# Patient Record
Sex: Male | Born: 1965 | Race: Black or African American | Hispanic: No | Marital: Married | Smoking: Never smoker
Health system: Southern US, Community
[De-identification: ages and names within clinical notes are randomized; demographics above are authoritative.]

## PROBLEM LIST (undated history)

## (undated) DIAGNOSIS — K469 Unspecified abdominal hernia without obstruction or gangrene: Secondary | ICD-10-CM

## (undated) DIAGNOSIS — K219 Gastro-esophageal reflux disease without esophagitis: Secondary | ICD-10-CM

## (undated) DIAGNOSIS — C801 Malignant (primary) neoplasm, unspecified: Secondary | ICD-10-CM

## (undated) DIAGNOSIS — I10 Essential (primary) hypertension: Secondary | ICD-10-CM

## (undated) DIAGNOSIS — Z9289 Personal history of other medical treatment: Secondary | ICD-10-CM

## (undated) DIAGNOSIS — M1990 Unspecified osteoarthritis, unspecified site: Secondary | ICD-10-CM

## (undated) DIAGNOSIS — G4730 Sleep apnea, unspecified: Secondary | ICD-10-CM

## (undated) DIAGNOSIS — E669 Obesity, unspecified: Secondary | ICD-10-CM

## (undated) DIAGNOSIS — M545 Low back pain, unspecified: Secondary | ICD-10-CM

## (undated) DIAGNOSIS — K439 Ventral hernia without obstruction or gangrene: Secondary | ICD-10-CM

## (undated) DIAGNOSIS — G5601 Carpal tunnel syndrome, right upper limb: Secondary | ICD-10-CM

## (undated) DIAGNOSIS — K760 Fatty (change of) liver, not elsewhere classified: Secondary | ICD-10-CM

## (undated) HISTORY — DX: Low back pain, unspecified: M54.50

## (undated) HISTORY — DX: Sleep apnea, unspecified: G47.30

## (undated) HISTORY — DX: Malignant (primary) neoplasm, unspecified: C80.1

## (undated) HISTORY — DX: Unspecified osteoarthritis, unspecified site: M19.90

## (undated) HISTORY — DX: Obesity, unspecified: E66.9

## (undated) HISTORY — DX: Low back pain: M54.5

---

## 2003-05-08 ENCOUNTER — Encounter: Admission: RE | Admit: 2003-05-08 | Discharge: 2003-05-08 | Payer: Self-pay | Admitting: *Deleted

## 2003-05-08 ENCOUNTER — Encounter: Payer: Self-pay | Admitting: *Deleted

## 2006-01-30 ENCOUNTER — Ambulatory Visit (HOSPITAL_COMMUNITY): Admission: RE | Admit: 2006-01-30 | Discharge: 2006-01-30 | Payer: Self-pay | Admitting: Family Medicine

## 2006-05-24 ENCOUNTER — Encounter (INDEPENDENT_AMBULATORY_CARE_PROVIDER_SITE_OTHER): Payer: Self-pay | Admitting: *Deleted

## 2006-05-24 ENCOUNTER — Ambulatory Visit (HOSPITAL_COMMUNITY): Admission: RE | Admit: 2006-05-24 | Discharge: 2006-05-24 | Payer: Self-pay | Admitting: Gastroenterology

## 2006-06-08 ENCOUNTER — Encounter: Admission: RE | Admit: 2006-06-08 | Discharge: 2006-06-08 | Payer: Self-pay | Admitting: General Surgery

## 2006-06-25 HISTORY — PX: COLON SURGERY: SHX602

## 2006-06-26 ENCOUNTER — Encounter (INDEPENDENT_AMBULATORY_CARE_PROVIDER_SITE_OTHER): Payer: Self-pay | Admitting: Specialist

## 2006-06-26 ENCOUNTER — Inpatient Hospital Stay (HOSPITAL_COMMUNITY): Admission: RE | Admit: 2006-06-26 | Discharge: 2006-07-04 | Payer: Self-pay | Admitting: General Surgery

## 2006-07-02 ENCOUNTER — Ambulatory Visit: Payer: Self-pay | Admitting: Oncology

## 2006-07-05 ENCOUNTER — Ambulatory Visit: Payer: Self-pay | Admitting: Oncology

## 2006-10-10 ENCOUNTER — Ambulatory Visit: Payer: Self-pay | Admitting: Oncology

## 2006-10-12 LAB — COMPREHENSIVE METABOLIC PANEL
Albumin: 4.3 g/dL (ref 3.5–5.2)
CO2: 25 mEq/L (ref 19–32)
Calcium: 9.6 mg/dL (ref 8.4–10.5)
Chloride: 102 mEq/L (ref 96–112)
Glucose, Bld: 136 mg/dL — ABNORMAL HIGH (ref 70–99)
Potassium: 4.2 mEq/L (ref 3.5–5.3)
Sodium: 138 mEq/L (ref 135–145)
Total Bilirubin: 0.9 mg/dL (ref 0.3–1.2)
Total Protein: 7.4 g/dL (ref 6.0–8.3)

## 2006-10-12 LAB — CBC WITH DIFFERENTIAL/PLATELET
Eosinophils Absolute: 0 10*3/uL (ref 0.0–0.5)
LYMPH%: 36.6 % (ref 14.0–48.0)
MONO#: 0.4 10*3/uL (ref 0.1–0.9)
NEUT#: 1.8 10*3/uL (ref 1.5–6.5)
Platelets: 305 10*3/uL (ref 145–400)
RBC: 5.27 10*6/uL (ref 4.20–5.71)
WBC: 3.6 10*3/uL — ABNORMAL LOW (ref 4.0–10.0)
lymph#: 1.3 10*3/uL (ref 0.9–3.3)

## 2006-10-12 LAB — LACTATE DEHYDROGENASE: LDH: 215 U/L (ref 94–250)

## 2006-10-22 LAB — CBC WITH DIFFERENTIAL/PLATELET
BASO%: 0.6 % (ref 0.0–2.0)
EOS%: 2 % (ref 0.0–7.0)
MCH: 23.8 pg — ABNORMAL LOW (ref 28.0–33.4)
MCHC: 32.6 g/dL (ref 32.0–35.9)
MONO#: 0.5 10*3/uL (ref 0.1–0.9)
RBC: 5.05 10*6/uL (ref 4.20–5.71)
RDW: 27.4 % — ABNORMAL HIGH (ref 11.2–14.6)
WBC: 5.5 10*3/uL (ref 4.0–10.0)
lymph#: 2.2 10*3/uL (ref 0.9–3.3)

## 2006-11-05 LAB — CBC WITH DIFFERENTIAL/PLATELET
BASO%: 0.5 % (ref 0.0–2.0)
Basophils Absolute: 0 10*3/uL (ref 0.0–0.1)
EOS%: 1.6 % (ref 0.0–7.0)
HCT: 35.4 % — ABNORMAL LOW (ref 38.7–49.9)
HGB: 11.8 g/dL — ABNORMAL LOW (ref 13.0–17.1)
MONO#: 0.8 10*3/uL (ref 0.1–0.9)
NEUT%: 41.3 % (ref 40.0–75.0)
RDW: 33.6 % — ABNORMAL HIGH (ref 11.2–14.6)
WBC: 4.7 10*3/uL (ref 4.0–10.0)
lymph#: 1.9 10*3/uL (ref 0.9–3.3)

## 2006-11-19 LAB — CBC WITH DIFFERENTIAL/PLATELET
Basophils Absolute: 0 10*3/uL (ref 0.0–0.1)
EOS%: 1.2 % (ref 0.0–7.0)
Eosinophils Absolute: 0.1 10*3/uL (ref 0.0–0.5)
HCT: 37.7 % — ABNORMAL LOW (ref 38.7–49.9)
HGB: 12.4 g/dL — ABNORMAL LOW (ref 13.0–17.1)
MCH: 26.2 pg — ABNORMAL LOW (ref 28.0–33.4)
NEUT#: 3.3 10*3/uL (ref 1.5–6.5)
NEUT%: 52.3 % (ref 40.0–75.0)
RDW: 37 % — ABNORMAL HIGH (ref 11.2–14.6)
lymph#: 2.1 10*3/uL (ref 0.9–3.3)

## 2006-11-22 ENCOUNTER — Ambulatory Visit: Payer: Self-pay | Admitting: Oncology

## 2006-11-26 LAB — CBC WITH DIFFERENTIAL/PLATELET
Basophils Absolute: 0 10*3/uL (ref 0.0–0.1)
EOS%: 1 % (ref 0.0–7.0)
Eosinophils Absolute: 0.1 10*3/uL (ref 0.0–0.5)
HCT: 38.5 % — ABNORMAL LOW (ref 38.7–49.9)
HGB: 12.6 g/dL — ABNORMAL LOW (ref 13.0–17.1)
LYMPH%: 37.3 % (ref 14.0–48.0)
MCH: 27.2 pg — ABNORMAL LOW (ref 28.0–33.4)
MCV: 82.8 fL (ref 81.6–98.0)
MONO%: 13.2 % — ABNORMAL HIGH (ref 0.0–13.0)
NEUT#: 3 10*3/uL (ref 1.5–6.5)
NEUT%: 48 % (ref 40.0–75.0)
Platelets: 280 10*3/uL (ref 145–400)
RDW: 35.3 % — ABNORMAL HIGH (ref 11.2–14.6)

## 2006-11-26 LAB — COMPREHENSIVE METABOLIC PANEL
ALT: 31 U/L (ref 0–53)
Albumin: 4.3 g/dL (ref 3.5–5.2)
CO2: 26 mEq/L (ref 19–32)
Calcium: 9.6 mg/dL (ref 8.4–10.5)
Chloride: 103 mEq/L (ref 96–112)
Glucose, Bld: 82 mg/dL (ref 70–99)
Sodium: 139 mEq/L (ref 135–145)
Total Protein: 7.4 g/dL (ref 6.0–8.3)

## 2006-11-26 LAB — LACTATE DEHYDROGENASE: LDH: 270 U/L — ABNORMAL HIGH (ref 94–250)

## 2006-12-24 LAB — CBC WITH DIFFERENTIAL/PLATELET
BASO%: 0.3 % (ref 0.0–2.0)
EOS%: 1 % (ref 0.0–7.0)
MCH: 30.3 pg (ref 28.0–33.4)
MCHC: 33.5 g/dL (ref 32.0–35.9)
MONO#: 0.6 10*3/uL (ref 0.1–0.9)
NEUT%: 49.5 % (ref 40.0–75.0)
RBC: 4.36 10*6/uL (ref 4.20–5.71)
RDW: 27.4 % — ABNORMAL HIGH (ref 11.2–14.6)
WBC: 5.2 10*3/uL (ref 4.0–10.0)
lymph#: 2 10*3/uL (ref 0.9–3.3)

## 2007-01-03 ENCOUNTER — Ambulatory Visit: Payer: Self-pay | Admitting: Oncology

## 2007-01-07 LAB — CBC WITH DIFFERENTIAL/PLATELET
BASO%: 0.6 % (ref 0.0–2.0)
Basophils Absolute: 0 10*3/uL (ref 0.0–0.1)
EOS%: 1.5 % (ref 0.0–7.0)
HGB: 13.6 g/dL (ref 13.0–17.1)
MCH: 31.5 pg (ref 28.0–33.4)
MONO#: 0.6 10*3/uL (ref 0.1–0.9)
NEUT#: 2.8 10*3/uL (ref 1.5–6.5)
RDW: 26.4 % — ABNORMAL HIGH (ref 11.2–14.6)
WBC: 5 10*3/uL (ref 4.0–10.0)
lymph#: 1.5 10*3/uL (ref 0.9–3.3)

## 2007-01-21 LAB — CBC WITH DIFFERENTIAL/PLATELET
Basophils Absolute: 0 10*3/uL (ref 0.0–0.1)
Eosinophils Absolute: 0.1 10*3/uL (ref 0.0–0.5)
HCT: 39.8 % (ref 38.7–49.9)
HGB: 13.5 g/dL (ref 13.0–17.1)
MCV: 91.1 fL (ref 81.6–98.0)
NEUT#: 2.3 10*3/uL (ref 1.5–6.5)
RDW: 22.7 % — ABNORMAL HIGH (ref 11.2–14.6)
lymph#: 2.1 10*3/uL (ref 0.9–3.3)

## 2007-01-21 LAB — COMPREHENSIVE METABOLIC PANEL
BUN: 10 mg/dL (ref 6–23)
CO2: 26 mEq/L (ref 19–32)
Creatinine, Ser: 1.03 mg/dL (ref 0.40–1.50)
Glucose, Bld: 88 mg/dL (ref 70–99)
Total Bilirubin: 0.6 mg/dL (ref 0.3–1.2)
Total Protein: 7 g/dL (ref 6.0–8.3)

## 2007-01-21 LAB — LACTATE DEHYDROGENASE: LDH: 226 U/L (ref 94–250)

## 2007-02-05 LAB — CBC WITH DIFFERENTIAL/PLATELET
BASO%: 0.4 % (ref 0.0–2.0)
Eosinophils Absolute: 0.1 10*3/uL (ref 0.0–0.5)
HCT: 40.1 % (ref 38.7–49.9)
HGB: 13.6 g/dL (ref 13.0–17.1)
MCHC: 33.8 g/dL (ref 32.0–35.9)
MONO#: 1 10*3/uL — ABNORMAL HIGH (ref 0.1–0.9)
NEUT#: 2.7 10*3/uL (ref 1.5–6.5)
NEUT%: 46.5 % (ref 40.0–75.0)
WBC: 5.9 10*3/uL (ref 4.0–10.0)
lymph#: 2 10*3/uL (ref 0.9–3.3)

## 2007-02-14 ENCOUNTER — Ambulatory Visit: Payer: Self-pay | Admitting: Oncology

## 2007-02-19 LAB — CBC WITH DIFFERENTIAL/PLATELET
Basophils Absolute: 0 10*3/uL (ref 0.0–0.1)
EOS%: 1.4 % (ref 0.0–7.0)
HCT: 40.4 % (ref 38.7–49.9)
HGB: 13.7 g/dL (ref 13.0–17.1)
MCH: 30.7 pg (ref 28.0–33.4)
MONO#: 0.7 10*3/uL (ref 0.1–0.9)
NEUT%: 53.6 % (ref 40.0–75.0)
lymph#: 1.7 10*3/uL (ref 0.9–3.3)

## 2007-03-05 LAB — CBC WITH DIFFERENTIAL/PLATELET
Basophils Absolute: 0 10*3/uL (ref 0.0–0.1)
EOS%: 1.6 % (ref 0.0–7.0)
HCT: 41.2 % (ref 38.7–49.9)
HGB: 13.9 g/dL (ref 13.0–17.1)
LYMPH%: 28.4 % (ref 14.0–48.0)
MCH: 30.8 pg (ref 28.0–33.4)
MCV: 91.1 fL (ref 81.6–98.0)
MONO%: 11 % (ref 0.0–13.0)
NEUT%: 58.4 % (ref 40.0–75.0)

## 2007-03-28 ENCOUNTER — Ambulatory Visit (HOSPITAL_COMMUNITY): Admission: RE | Admit: 2007-03-28 | Discharge: 2007-03-28 | Payer: Self-pay | Admitting: Oncology

## 2007-03-28 LAB — COMPREHENSIVE METABOLIC PANEL
ALT: 62 U/L — ABNORMAL HIGH (ref 0–53)
AST: 53 U/L — ABNORMAL HIGH (ref 0–37)
Albumin: 4.2 g/dL (ref 3.5–5.2)
Alkaline Phosphatase: 58 U/L (ref 39–117)
BUN: 10 mg/dL (ref 6–23)
CO2: 24 mEq/L (ref 19–32)
Calcium: 9.5 mg/dL (ref 8.4–10.5)
Chloride: 105 mEq/L (ref 96–112)
Creatinine, Ser: 0.97 mg/dL (ref 0.40–1.50)
Glucose, Bld: 139 mg/dL — ABNORMAL HIGH (ref 70–99)
Potassium: 3.9 mEq/L (ref 3.5–5.3)
Sodium: 137 mEq/L (ref 135–145)
Total Bilirubin: 0.8 mg/dL (ref 0.3–1.2)
Total Protein: 7.2 g/dL (ref 6.0–8.3)

## 2007-03-28 LAB — CBC WITH DIFFERENTIAL/PLATELET
EOS%: 1.1 % (ref 0.0–7.0)
MCH: 30 pg (ref 28.0–33.4)
MCV: 88.8 fL (ref 81.6–98.0)
MONO%: 8.6 % (ref 0.0–13.0)
RBC: 4.76 10*6/uL (ref 4.20–5.71)
RDW: 20.8 % — ABNORMAL HIGH (ref 11.2–14.6)

## 2007-03-28 LAB — CEA: CEA: 1.4 ng/mL (ref 0.0–5.0)

## 2007-04-01 ENCOUNTER — Ambulatory Visit: Payer: Self-pay | Admitting: Oncology

## 2007-04-14 ENCOUNTER — Emergency Department (HOSPITAL_COMMUNITY): Admission: EM | Admit: 2007-04-14 | Discharge: 2007-04-14 | Payer: Self-pay | Admitting: Emergency Medicine

## 2007-05-06 LAB — CBC WITH DIFFERENTIAL/PLATELET
BASO%: 0.4 % (ref 0.0–2.0)
Basophils Absolute: 0 10*3/uL (ref 0.0–0.1)
EOS%: 1.1 % (ref 0.0–7.0)
Eosinophils Absolute: 0.1 10*3/uL (ref 0.0–0.5)
HCT: 44 % (ref 38.7–49.9)
HGB: 15.1 g/dL (ref 13.0–17.1)
LYMPH%: 22.7 % (ref 14.0–48.0)
MCH: 29.6 pg (ref 28.0–33.4)
MCHC: 34.2 g/dL (ref 32.0–35.9)
MCV: 86.4 fL (ref 81.6–98.0)
MONO#: 0.6 10*3/uL (ref 0.1–0.9)
MONO%: 9 % (ref 0.0–13.0)
NEUT#: 4.4 10*3/uL (ref 1.5–6.5)
NEUT%: 66.8 % (ref 40.0–75.0)
Platelets: 237 10*3/uL (ref 145–400)
RBC: 5.09 10*6/uL (ref 4.20–5.71)
RDW: 16.8 % — ABNORMAL HIGH (ref 11.2–14.6)
WBC: 6.6 10*3/uL (ref 4.0–10.0)
lymph#: 1.5 10*3/uL (ref 0.9–3.3)

## 2007-05-06 LAB — COMPREHENSIVE METABOLIC PANEL
ALT: 84 U/L — ABNORMAL HIGH (ref 0–53)
AST: 72 U/L — ABNORMAL HIGH (ref 0–37)
Alkaline Phosphatase: 60 U/L (ref 39–117)
Creatinine, Ser: 0.98 mg/dL (ref 0.40–1.50)
Sodium: 142 mEq/L (ref 135–145)
Total Bilirubin: 0.5 mg/dL (ref 0.3–1.2)
Total Protein: 7.4 g/dL (ref 6.0–8.3)

## 2007-06-05 ENCOUNTER — Ambulatory Visit: Payer: Self-pay | Admitting: Oncology

## 2007-06-10 ENCOUNTER — Ambulatory Visit (HOSPITAL_COMMUNITY): Admission: RE | Admit: 2007-06-10 | Discharge: 2007-06-10 | Payer: Self-pay | Admitting: Gastroenterology

## 2007-07-26 ENCOUNTER — Ambulatory Visit: Payer: Self-pay | Admitting: Oncology

## 2007-07-30 ENCOUNTER — Ambulatory Visit (HOSPITAL_COMMUNITY): Admission: RE | Admit: 2007-07-30 | Discharge: 2007-07-30 | Payer: Self-pay | Admitting: Oncology

## 2007-07-30 LAB — CBC WITH DIFFERENTIAL/PLATELET
Basophils Absolute: 0 10*3/uL (ref 0.0–0.1)
Eosinophils Absolute: 0 10*3/uL (ref 0.0–0.5)
HGB: 14.9 g/dL (ref 13.0–17.1)
LYMPH%: 26.9 % (ref 14.0–48.0)
MCH: 28 pg (ref 28.0–33.4)
MCV: 82.3 fL (ref 81.6–98.0)
MONO%: 6.5 % (ref 0.0–13.0)
NEUT#: 4.2 10*3/uL (ref 1.5–6.5)
Platelets: 267 10*3/uL (ref 145–400)
RBC: 5.34 10*6/uL (ref 4.20–5.71)

## 2007-07-30 LAB — COMPREHENSIVE METABOLIC PANEL
Albumin: 3.8 g/dL (ref 3.5–5.2)
CO2: 26 mEq/L (ref 19–32)
Calcium: 9 mg/dL (ref 8.4–10.5)
Glucose, Bld: 91 mg/dL (ref 70–99)
Potassium: 3.6 mEq/L (ref 3.5–5.3)
Sodium: 137 mEq/L (ref 135–145)
Total Protein: 7.2 g/dL (ref 6.0–8.3)

## 2007-07-30 LAB — LACTATE DEHYDROGENASE: LDH: 291 U/L — ABNORMAL HIGH (ref 94–250)

## 2007-10-01 ENCOUNTER — Encounter: Admission: RE | Admit: 2007-10-01 | Discharge: 2007-10-01 | Payer: Self-pay | Admitting: Family Medicine

## 2007-10-04 ENCOUNTER — Encounter: Admission: RE | Admit: 2007-10-04 | Discharge: 2007-10-04 | Payer: Self-pay | Admitting: Family Medicine

## 2008-01-22 ENCOUNTER — Ambulatory Visit: Payer: Self-pay | Admitting: Oncology

## 2008-01-27 ENCOUNTER — Ambulatory Visit (HOSPITAL_COMMUNITY): Admission: RE | Admit: 2008-01-27 | Discharge: 2008-01-27 | Payer: Self-pay | Admitting: Oncology

## 2008-01-27 LAB — COMPREHENSIVE METABOLIC PANEL
AST: 71 U/L — ABNORMAL HIGH (ref 0–37)
Alkaline Phosphatase: 52 U/L (ref 39–117)
BUN: 7 mg/dL (ref 6–23)
Calcium: 9.5 mg/dL (ref 8.4–10.5)
Chloride: 105 mEq/L (ref 96–112)
Creatinine, Ser: 0.94 mg/dL (ref 0.40–1.50)

## 2008-01-27 LAB — CBC WITH DIFFERENTIAL/PLATELET
Basophils Absolute: 0.1 10*3/uL (ref 0.0–0.1)
EOS%: 1.6 % (ref 0.0–7.0)
HCT: 45 % (ref 38.7–49.9)
HGB: 15.5 g/dL (ref 13.0–17.1)
MCH: 29 pg (ref 28.0–33.4)
MCV: 84.3 fL (ref 81.6–98.0)
MONO%: 6.7 % (ref 0.0–13.0)
NEUT%: 58.4 % (ref 40.0–75.0)

## 2008-03-29 IMAGING — CT CT HEAD W/O CM
1 of 2 series · 13 of 30 positions shown, 17 images · IV contrast (agent unspecified)
Comparison: None available.

CLINICAL DATA: Nausea, dizziness.  
 HEAD CT WITHOUT CONTRAST:
TECHNIQUE: Contiguous axial images were obtained from the base of the skull through the vertex according to standard protocol without contrast.

[Series 2: brain · axial · 0.47mm/px · z∈[+83,+214]mm · 13 of 32 slices shown, 17 images]
[im 3/32  brain]
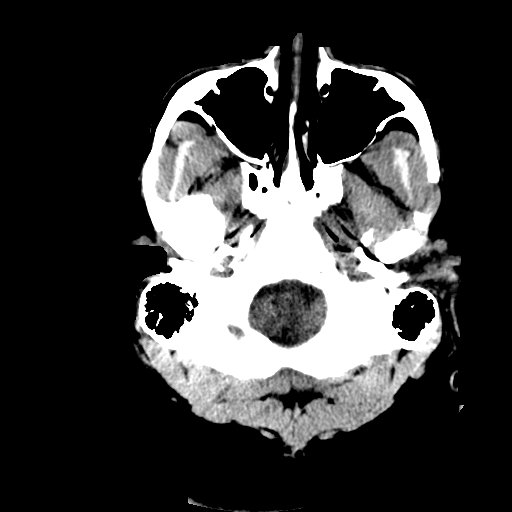
[im 3/32  bone]
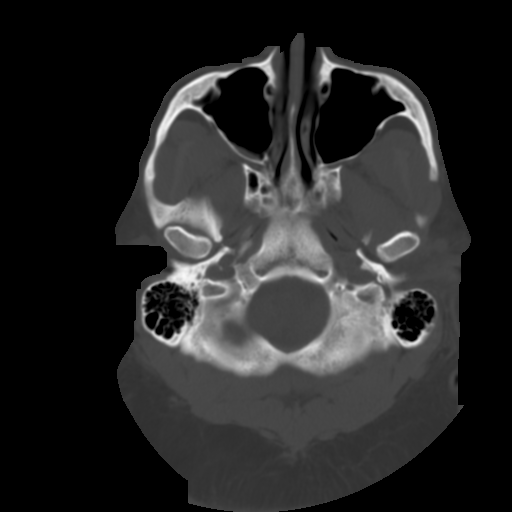
[im 5/32  brain]
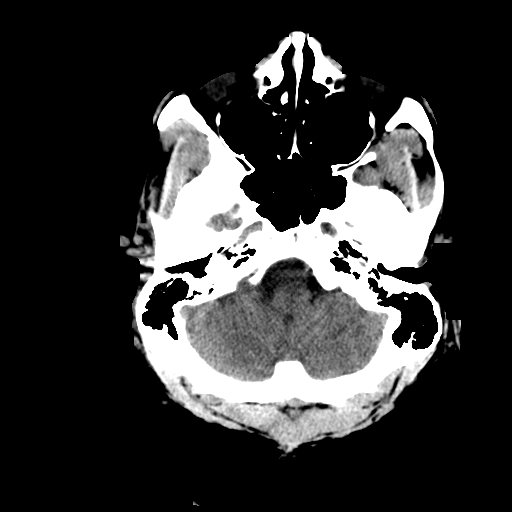
[im 7/32  brain]
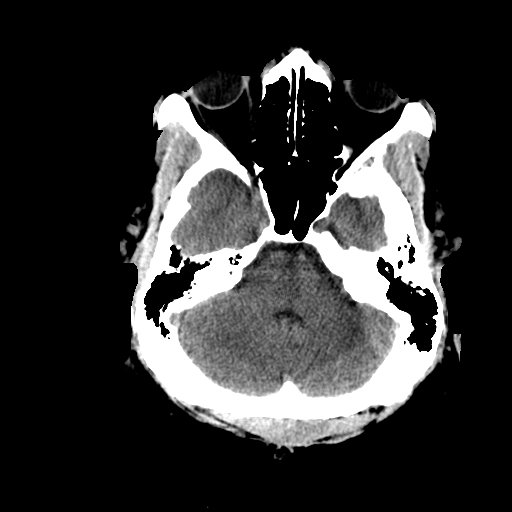
[im 9/32  brain]
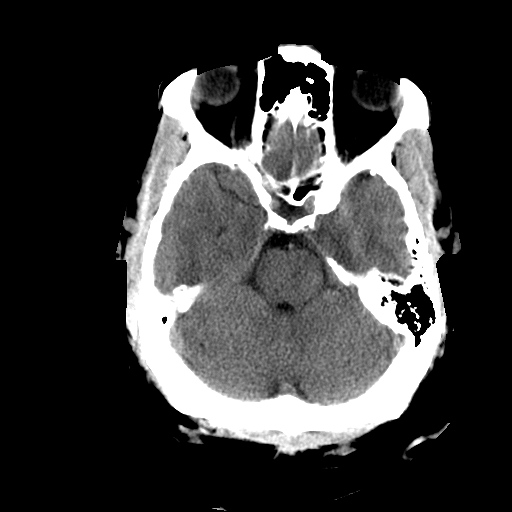
[im 12/32  brain]
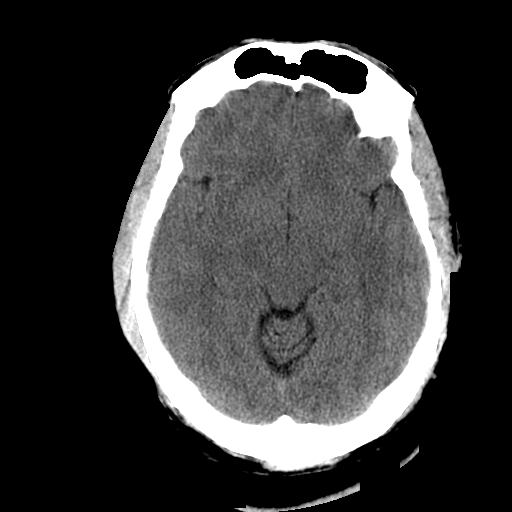
[im 12/32  bone]
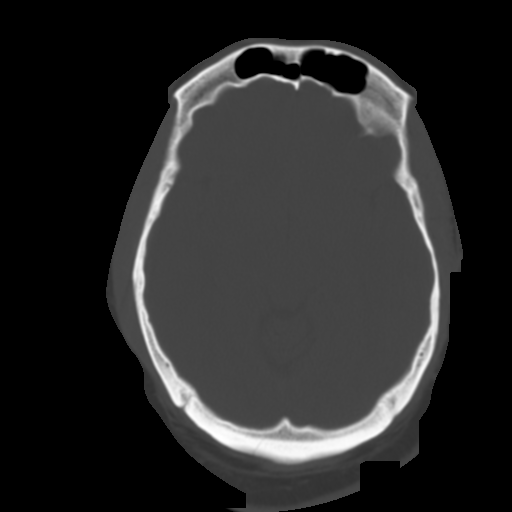
[im 14/32  brain]
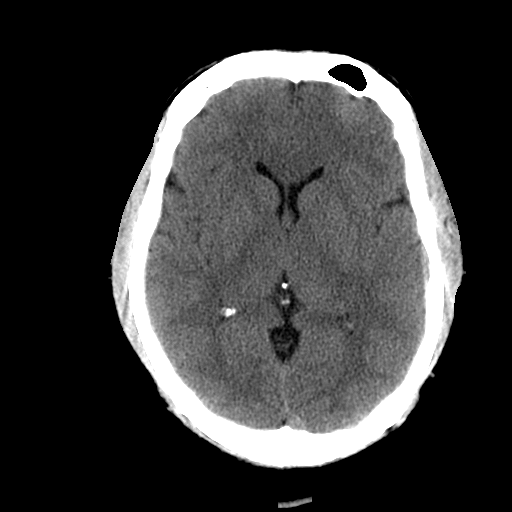
[im 16/32  brain]
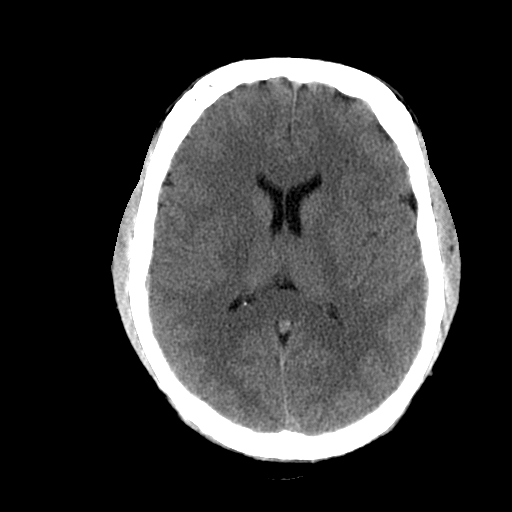
[im 18/32  brain]
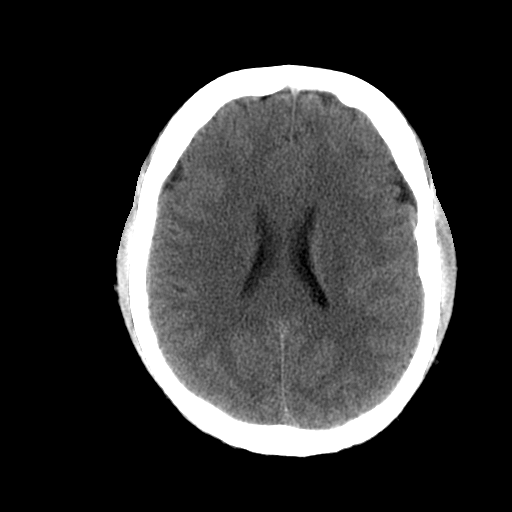
[im 20/32  brain]
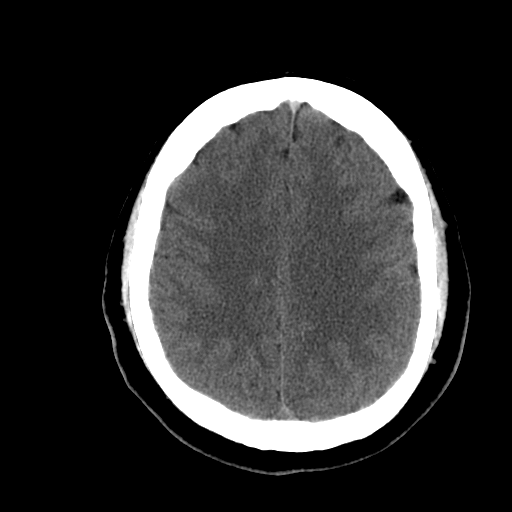
[im 20/32  bone]
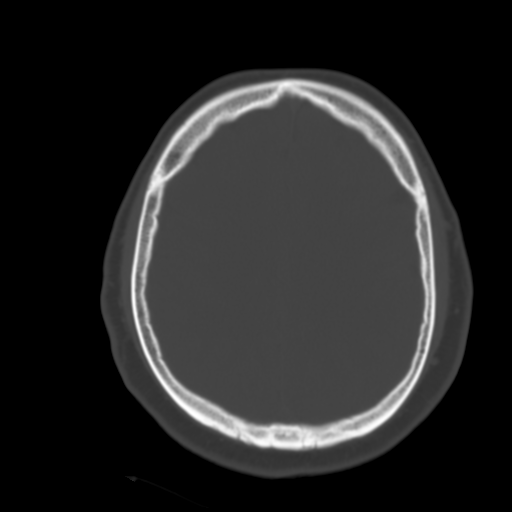
[im 23/32  brain]
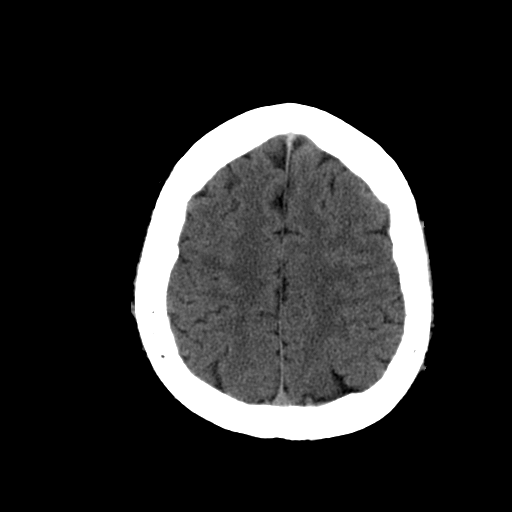
[im 25/32  brain]
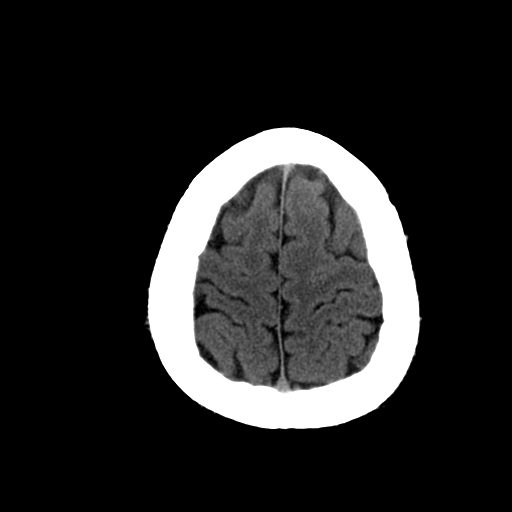
[im 27/32  brain]
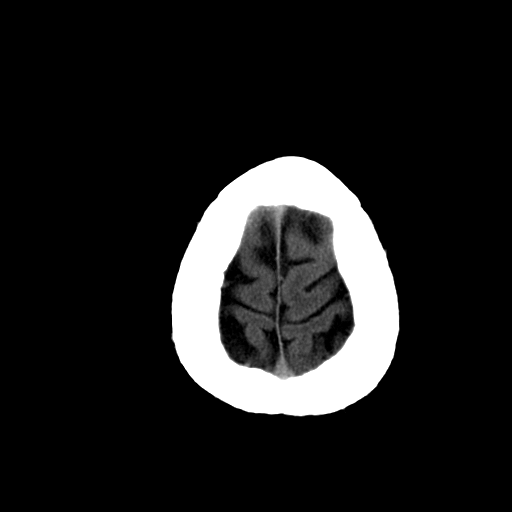
[im 29/32  brain]
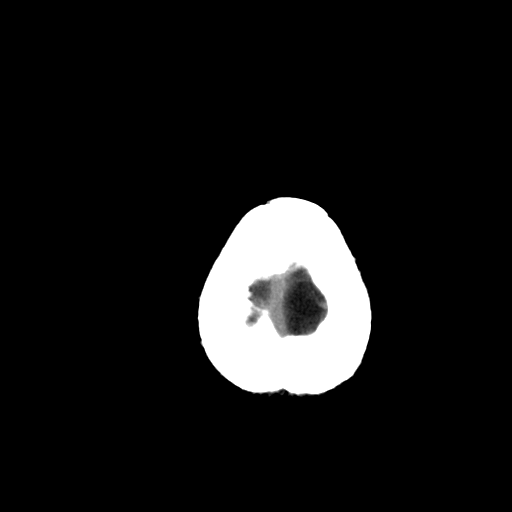
[im 29/32  bone]
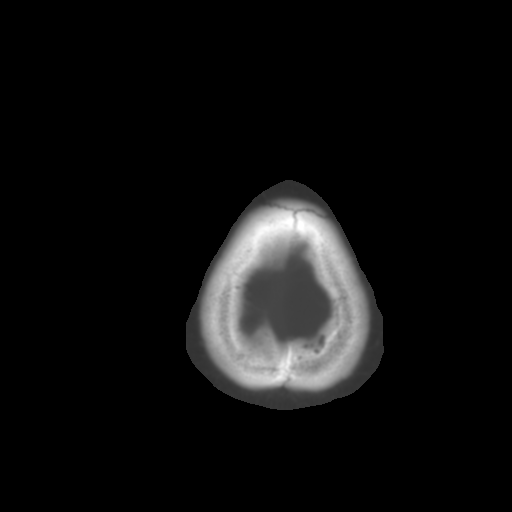

[13 of 30 positions shown; findings below may reference images not displayed]

FINDINGS: There is no evidence of acute intracranial abnormality including hemorrhage, infarct, mass, mass effect, midline or abnormal extraaxial fluid collection.  No hydrocephalus.  Mucosal thickening is seen posteriorly in the right maxillary sinus.  Imaged paranasal sinuses and mastoid air cells are otherwise clear.  No focal bony abnormality.
IMPRESSION: No acute intracranial abnormality with right maxillary sinus disease noted.

## 2008-07-28 ENCOUNTER — Ambulatory Visit: Payer: Self-pay | Admitting: Oncology

## 2008-07-28 LAB — COMPREHENSIVE METABOLIC PANEL
ALT: 78 U/L — ABNORMAL HIGH (ref 0–53)
AST: 57 U/L — ABNORMAL HIGH (ref 0–37)
Chloride: 103 mEq/L (ref 96–112)
Creatinine, Ser: 0.87 mg/dL (ref 0.40–1.50)
Total Bilirubin: 0.8 mg/dL (ref 0.3–1.2)

## 2008-07-28 LAB — CBC WITH DIFFERENTIAL/PLATELET
BASO%: 0.5 % (ref 0.0–2.0)
LYMPH%: 33.1 % (ref 14.0–48.0)
MCHC: 33.7 g/dL (ref 32.0–35.9)
MONO#: 0.6 10*3/uL (ref 0.1–0.9)
MONO%: 9.4 % (ref 0.0–13.0)
Platelets: 203 10*3/uL (ref 145–400)
RBC: 5.28 10*6/uL (ref 4.20–5.71)
RDW: 13.7 % (ref 11.2–14.6)
WBC: 6 10*3/uL (ref 4.0–10.0)

## 2008-07-28 LAB — CEA: CEA: 0.7 ng/mL (ref 0.0–5.0)

## 2008-07-29 ENCOUNTER — Ambulatory Visit (HOSPITAL_COMMUNITY): Admission: RE | Admit: 2008-07-29 | Discharge: 2008-07-29 | Payer: Self-pay | Admitting: Oncology

## 2009-01-21 ENCOUNTER — Ambulatory Visit: Payer: Self-pay | Admitting: Oncology

## 2009-07-09 ENCOUNTER — Ambulatory Visit: Payer: Self-pay | Admitting: Oncology

## 2009-07-15 LAB — CBC WITH DIFFERENTIAL/PLATELET
Basophils Absolute: 0 10*3/uL (ref 0.0–0.1)
EOS%: 0.9 % (ref 0.0–7.0)
Eosinophils Absolute: 0.1 10*3/uL (ref 0.0–0.5)
HCT: 47.5 % (ref 38.4–49.9)
HGB: 15.5 g/dL (ref 13.0–17.1)
MONO#: 0.4 10*3/uL (ref 0.1–0.9)
NEUT#: 3.3 10*3/uL (ref 1.5–6.5)
NEUT%: 57.3 % (ref 39.0–75.0)
RDW: 14 % (ref 11.0–14.6)
WBC: 5.8 10*3/uL (ref 4.0–10.3)
lymph#: 2 10*3/uL (ref 0.9–3.3)

## 2009-07-15 LAB — COMPREHENSIVE METABOLIC PANEL
AST: 35 U/L (ref 0–37)
Albumin: 4.4 g/dL (ref 3.5–5.2)
BUN: 13 mg/dL (ref 6–23)
CO2: 27 mEq/L (ref 19–32)
Calcium: 9.5 mg/dL (ref 8.4–10.5)
Chloride: 103 mEq/L (ref 96–112)
Glucose, Bld: 96 mg/dL (ref 70–99)
Potassium: 4.5 mEq/L (ref 3.5–5.3)

## 2009-07-15 LAB — LACTATE DEHYDROGENASE: LDH: 226 U/L (ref 94–250)

## 2009-08-09 ENCOUNTER — Ambulatory Visit (HOSPITAL_COMMUNITY): Admission: RE | Admit: 2009-08-09 | Discharge: 2009-08-09 | Payer: Self-pay | Admitting: Oncology

## 2010-01-07 ENCOUNTER — Ambulatory Visit: Payer: Self-pay | Admitting: Oncology

## 2010-01-14 LAB — CBC WITH DIFFERENTIAL/PLATELET
BASO%: 0.4 % (ref 0.0–2.0)
Eosinophils Absolute: 0.1 10*3/uL (ref 0.0–0.5)
HCT: 47 % (ref 38.4–49.9)
LYMPH%: 38.6 % (ref 14.0–49.0)
MCHC: 32.6 g/dL (ref 32.0–36.0)
MCV: 89 fL (ref 79.3–98.0)
MONO#: 0.6 10*3/uL (ref 0.1–0.9)
MONO%: 10.1 % (ref 0.0–14.0)
NEUT%: 49.6 % (ref 39.0–75.0)
Platelets: 201 10*3/uL (ref 140–400)
RBC: 5.28 10*6/uL (ref 4.20–5.82)
WBC: 6.1 10*3/uL (ref 4.0–10.3)

## 2010-01-14 LAB — COMPREHENSIVE METABOLIC PANEL
Alkaline Phosphatase: 47 U/L (ref 39–117)
CO2: 23 mEq/L (ref 19–32)
Creatinine, Ser: 1.02 mg/dL (ref 0.40–1.50)
Glucose, Bld: 89 mg/dL (ref 70–99)
Total Bilirubin: 0.7 mg/dL (ref 0.3–1.2)

## 2010-01-14 LAB — LACTATE DEHYDROGENASE: LDH: 209 U/L (ref 94–250)

## 2010-01-14 LAB — CEA: CEA: 0.7 ng/mL (ref 0.0–5.0)

## 2010-07-07 ENCOUNTER — Ambulatory Visit: Payer: Self-pay | Admitting: Oncology

## 2010-07-11 ENCOUNTER — Ambulatory Visit (HOSPITAL_COMMUNITY): Admission: RE | Admit: 2010-07-11 | Discharge: 2010-07-11 | Payer: Self-pay | Admitting: Oncology

## 2010-07-11 LAB — CBC WITH DIFFERENTIAL/PLATELET
EOS%: 2 % (ref 0.0–7.0)
Eosinophils Absolute: 0.1 10*3/uL (ref 0.0–0.5)
HGB: 15.7 g/dL (ref 13.0–17.1)
NEUT#: 2.5 10*3/uL (ref 1.5–6.5)
WBC: 5 10*3/uL (ref 4.0–10.3)
nRBC: 0 % (ref 0–0)

## 2010-07-11 LAB — CEA: CEA: 1 ng/mL (ref 0.0–5.0)

## 2010-07-11 LAB — COMPREHENSIVE METABOLIC PANEL
ALT: 65 U/L — ABNORMAL HIGH (ref 0–53)
AST: 47 U/L — ABNORMAL HIGH (ref 0–37)
CO2: 30 mEq/L (ref 19–32)
Calcium: 9.4 mg/dL (ref 8.4–10.5)
Chloride: 103 mEq/L (ref 96–112)
Creatinine, Ser: 0.98 mg/dL (ref 0.40–1.50)
Glucose, Bld: 96 mg/dL (ref 70–99)
Potassium: 4.1 mEq/L (ref 3.5–5.3)
Sodium: 139 mEq/L (ref 135–145)

## 2010-07-25 ENCOUNTER — Ambulatory Visit (HOSPITAL_COMMUNITY): Admission: RE | Admit: 2010-07-25 | Discharge: 2010-07-25 | Payer: Self-pay | Admitting: Gastroenterology

## 2010-08-09 ENCOUNTER — Ambulatory Visit (HOSPITAL_COMMUNITY): Payer: Self-pay | Admitting: Licensed Clinical Social Worker

## 2010-08-26 ENCOUNTER — Ambulatory Visit (HOSPITAL_COMMUNITY): Payer: Self-pay | Admitting: Licensed Clinical Social Worker

## 2010-09-02 ENCOUNTER — Ambulatory Visit (HOSPITAL_COMMUNITY): Payer: Self-pay | Admitting: Licensed Clinical Social Worker

## 2010-09-08 ENCOUNTER — Ambulatory Visit (HOSPITAL_COMMUNITY): Payer: Self-pay | Admitting: Licensed Clinical Social Worker

## 2010-09-27 ENCOUNTER — Ambulatory Visit (HOSPITAL_COMMUNITY)
Admission: RE | Admit: 2010-09-27 | Discharge: 2010-09-27 | Payer: Self-pay | Source: Home / Self Care | Attending: Licensed Clinical Social Worker | Admitting: Licensed Clinical Social Worker

## 2010-10-06 ENCOUNTER — Ambulatory Visit (HOSPITAL_COMMUNITY): Admit: 2010-10-06 | Payer: Self-pay | Admitting: Licensed Clinical Social Worker

## 2010-10-13 ENCOUNTER — Ambulatory Visit (HOSPITAL_COMMUNITY)
Admission: RE | Admit: 2010-10-13 | Discharge: 2010-10-13 | Payer: Self-pay | Source: Home / Self Care | Attending: Licensed Clinical Social Worker | Admitting: Licensed Clinical Social Worker

## 2010-10-15 ENCOUNTER — Other Ambulatory Visit: Payer: Self-pay | Admitting: Oncology

## 2010-10-15 DIAGNOSIS — C189 Malignant neoplasm of colon, unspecified: Secondary | ICD-10-CM

## 2010-10-17 ENCOUNTER — Encounter: Payer: Self-pay | Admitting: Oncology

## 2010-10-20 ENCOUNTER — Ambulatory Visit (HOSPITAL_COMMUNITY)
Admission: RE | Admit: 2010-10-20 | Discharge: 2010-10-20 | Payer: Self-pay | Source: Home / Self Care | Attending: Licensed Clinical Social Worker | Admitting: Licensed Clinical Social Worker

## 2010-10-27 ENCOUNTER — Ambulatory Visit (HOSPITAL_COMMUNITY): Admit: 2010-10-27 | Payer: Self-pay | Admitting: Licensed Clinical Social Worker

## 2010-10-27 ENCOUNTER — Ambulatory Visit (HOSPITAL_COMMUNITY): Payer: BC Managed Care – PPO | Admitting: Licensed Clinical Social Worker

## 2010-10-27 DIAGNOSIS — F329 Major depressive disorder, single episode, unspecified: Secondary | ICD-10-CM

## 2010-11-02 ENCOUNTER — Encounter (HOSPITAL_COMMUNITY): Payer: Self-pay | Admitting: Radiology

## 2010-11-02 ENCOUNTER — Emergency Department (HOSPITAL_COMMUNITY)
Admission: EM | Admit: 2010-11-02 | Discharge: 2010-11-02 | Disposition: A | Discharge status: Home / Self Care | Payer: BC Managed Care – PPO | Attending: Emergency Medicine | Admitting: Emergency Medicine

## 2010-11-02 ENCOUNTER — Emergency Department (HOSPITAL_COMMUNITY): Payer: BC Managed Care – PPO

## 2010-11-02 DIAGNOSIS — Z85038 Personal history of other malignant neoplasm of large intestine: Secondary | ICD-10-CM | POA: Insufficient documentation

## 2010-11-02 DIAGNOSIS — R109 Unspecified abdominal pain: Secondary | ICD-10-CM | POA: Insufficient documentation

## 2010-11-02 DIAGNOSIS — K7689 Other specified diseases of liver: Secondary | ICD-10-CM | POA: Insufficient documentation

## 2010-11-02 DIAGNOSIS — D3500 Benign neoplasm of unspecified adrenal gland: Secondary | ICD-10-CM | POA: Insufficient documentation

## 2010-11-02 LAB — COMPREHENSIVE METABOLIC PANEL
ALT: 60 U/L — ABNORMAL HIGH (ref 0–53)
AST: 40 U/L — ABNORMAL HIGH (ref 0–37)
Albumin: 4.3 g/dL (ref 3.5–5.2)
Alkaline Phosphatase: 44 U/L (ref 39–117)
Chloride: 102 mEq/L (ref 96–112)
Potassium: 3.8 mEq/L (ref 3.5–5.1)
Sodium: 140 mEq/L (ref 135–145)
Total Bilirubin: 0.9 mg/dL (ref 0.3–1.2)
Total Protein: 7.6 g/dL (ref 6.0–8.3)

## 2010-11-02 LAB — URINALYSIS, ROUTINE W REFLEX MICROSCOPIC
Bilirubin Urine: NEGATIVE
Hgb urine dipstick: NEGATIVE
Protein, ur: NEGATIVE mg/dL
Urine Glucose, Fasting: NEGATIVE mg/dL
Urobilinogen, UA: 0.2 mg/dL (ref 0.0–1.0)

## 2010-11-02 LAB — DIFFERENTIAL
Basophils Absolute: 0 10*3/uL (ref 0.0–0.1)
Basophils Relative: 1 % (ref 0–1)
Eosinophils Relative: 2 % (ref 0–5)
Lymphocytes Relative: 36 % (ref 12–46)
Monocytes Absolute: 0.4 10*3/uL (ref 0.1–1.0)

## 2010-11-02 LAB — CBC
HCT: 46.4 % (ref 39.0–52.0)
Hemoglobin: 16 g/dL (ref 13.0–17.0)
MCH: 29.6 pg (ref 26.0–34.0)
MCHC: 34.5 g/dL (ref 30.0–36.0)

## 2010-11-02 MED ORDER — IOHEXOL 300 MG/ML  SOLN
125.0000 mL | Freq: Once | INTRAMUSCULAR | Status: AC | PRN
  Administered 2010-11-02: 125 mL via INTRAVENOUS

## 2010-11-10 ENCOUNTER — Encounter (HOSPITAL_COMMUNITY): Payer: BC Managed Care – PPO | Admitting: Licensed Clinical Social Worker

## 2010-11-10 DIAGNOSIS — F329 Major depressive disorder, single episode, unspecified: Secondary | ICD-10-CM

## 2010-11-17 ENCOUNTER — Encounter (HOSPITAL_COMMUNITY): Payer: BC Managed Care – PPO | Admitting: Licensed Clinical Social Worker

## 2010-11-17 DIAGNOSIS — F329 Major depressive disorder, single episode, unspecified: Secondary | ICD-10-CM

## 2010-12-01 ENCOUNTER — Encounter (HOSPITAL_COMMUNITY): Payer: Self-pay | Admitting: Licensed Clinical Social Worker

## 2010-12-08 ENCOUNTER — Encounter (HOSPITAL_COMMUNITY): Payer: BC Managed Care – PPO | Admitting: Licensed Clinical Social Worker

## 2010-12-08 DIAGNOSIS — F329 Major depressive disorder, single episode, unspecified: Secondary | ICD-10-CM

## 2010-12-15 ENCOUNTER — Encounter (HOSPITAL_COMMUNITY): Payer: BC Managed Care – PPO | Admitting: Licensed Clinical Social Worker

## 2010-12-29 ENCOUNTER — Encounter (HOSPITAL_COMMUNITY): Payer: BC Managed Care – PPO | Admitting: Licensed Clinical Social Worker

## 2011-01-05 ENCOUNTER — Encounter (HOSPITAL_COMMUNITY): Payer: BC Managed Care – PPO | Admitting: Licensed Clinical Social Worker

## 2011-01-05 DIAGNOSIS — F329 Major depressive disorder, single episode, unspecified: Secondary | ICD-10-CM

## 2011-01-20 ENCOUNTER — Encounter (HOSPITAL_COMMUNITY): Payer: BC Managed Care – PPO | Admitting: Licensed Clinical Social Worker

## 2011-01-23 ENCOUNTER — Encounter (HOSPITAL_COMMUNITY): Payer: BC Managed Care – PPO | Admitting: Licensed Clinical Social Worker

## 2011-01-23 DIAGNOSIS — F329 Major depressive disorder, single episode, unspecified: Secondary | ICD-10-CM

## 2011-02-07 NOTE — Op Note (Signed)
[name], Cameron Jones             ACCOUNT NO.:  [id number]   MEDICAL RECORD NO.:  [id number]          PATIENT TYPE:  AMB   LOCATION:  ENDO                         FACILITY:  [facility]   PHYSICIAN:  James L. Malon Kindle., M.D.DATE OF BIRTH:  [birth year]   DATE OF PROCEDURE:  06/10/2007  DATE OF DISCHARGE:                               OPERATIVE REPORT   PROCEDURE:  Colonoscopy.   MEDICATIONS:  1. Fentanyl 100 mcg IV.  2. Versed 7 mg IV.   INDICATIONS:  A strong family history of colon cancer.  A brother had  colon cancer at age 47.   DESCRIPTION OF PROCEDURE:  The procedure was explained to the patient  and consent obtained.  With the patient in the left lateral decubitus  position, the Pentax colonoscope was inserted and advanced.  The prep  was excellent, and we were reach the cecum without difficulty until the  ileocecal valve and appendiceal orifices were seen.  The scope was  withdrawn into the cecum.  The ascending, transverse, descending, and  sigmoid colons were seen well, no polyps were seen.  The scope was  withdrawn down into the rectum and on forward and retroflexed view was  free of any polyps.  The scope was withdrawn, and the patient tolerated  the procedure well.   ASSESSMENT:  Family history of colon cancer with his brother having  colon cancer at a very young age, but his colonoscopy currently is  negative.   PLAN:  I will recommend a five-year repeat colonoscopy.           ______________________________  Llana Aliment. Malon Kindle., M.D.     Waldron Session  D:  06/10/2007  T:  06/10/2007  Job:  [id number]   cc:   Lavonda Jumbo, M.D.  Fax: [phone]

## 2011-02-09 ENCOUNTER — Encounter (HOSPITAL_COMMUNITY): Payer: BC Managed Care – PPO | Admitting: Licensed Clinical Social Worker

## 2011-02-09 DIAGNOSIS — F329 Major depressive disorder, single episode, unspecified: Secondary | ICD-10-CM

## 2011-02-10 NOTE — Op Note (Signed)
Cameron Jones, Cameron Jones             ACCOUNT NO.:  [id number]   MEDICAL RECORD NO.:  [id number]          PATIENT TYPE:  AMB   LOCATION:  ENDO                         FACILITY:  [facility]   PHYSICIAN:  James L. Malon Kindle., M.D.DATE OF BIRTH:  1966/04/25   DATE OF PROCEDURE:  05/24/2006  DATE OF DISCHARGE:                                 OPERATIVE REPORT   SURGEON:  Fayrene Fearing L. Randa Evens, M.D.   PROCEDURE:  Colonoscopy and biopsy.   MEDICATIONS:  Fentanyl 100 mcg, Versed 10 mg IV.   SCOPE:  Adult adjustable colonoscope.   INDICATIONS:  A 45 year old gentleman, whose brother had colon cancer at 35.  He has had rectal bleeding about two weeks, and it stopped.  At other  colonoscopy, he had polyps.  He apparently had a colonoscopy done in another  town about eight or nine years ago that was performed.   DESCRIPTION OF PROCEDURE:  The procedure was explained and the patient's  consent obtained.  Left lateral decubitus position.  The Olympus scope was  inserted and advanced.  Due to his obesity, we used the adult adjustable  scope.  We were able to get over to the hepatic flexure, where the mass was  seen.  We had to get him on his back and we were able to advance down into  the cecum and ileocecal valve.  The scope was withdrawn.  The right colon  was okay until we came to the hepatic flexure, and then there was what  appeared to be an enormous mass that by measuring with a biopsy forceps was  estimated to be 6 to 7 cm in diameter.  It was ulcerated.  I took many  biopsies.  It was quite bloody.  It was not soft.  The scope was withdrawn  and the remainder of the colon examined.  The transverse, descending and  sigmoid colon were free of polyps and other lesions.  No diverticulosis.  The scope was withdrawn and the patient tolerated the procedure well and was  resting comfortably at the termination of the procedure.   ASSESSMENT:  Hepatic flexure polypoid mass, biopsied.  Due to its large  size, I thought that this was too high risk to try to remove endoscopically.  Very likely it is a malignancy.   PLAN:  Will check path and discuss the options with the patient and his  family.           ______________________________  Llana Aliment Malon Kindle., M.D.     Waldron Session  D:  05/24/2006  T:  05/24/2006  Job:  [id number]   cc:   Lavonda Jumbo, M.D.

## 2011-02-10 NOTE — Discharge Summary (Signed)
NAMEZADIEL, LEYH             ACCOUNT NO.:  [id number]   MEDICAL RECORD NO.:  [id number]          PATIENT TYPE:  INP   LOCATION:  1603                         FACILITY:  [facility]   PHYSICIAN:  Angelia Mould. Derrell Lolling, M.D.DATE OF BIRTH:  [birth year]   DATE OF ADMISSION:  06/26/2006  DATE OF DISCHARGE:  07/04/2006                                 DISCHARGE SUMMARY   FINAL DIAGNOSES:  1. Adenocarcinoma of the right colon, hepatic flexure, stage T3, N0.  2. Family history of colon cancer.  3. Morbid obesity.  4. Sleep apnea.   OPERATIONS PERFORMED:  Right colectomy, date July 04, 2006.   HISTORY:  This is a 45 year old black man who weighs 420 pounds and has a  brother who was diagnosed with colon cancer at age 85 and other family  members with colon polyps.  He underwent a colonoscopy on May 24, 2006,  because of mild intermittent nausea and hemoccult positive stools.  Dr.  Carman Ching found an enormous mass measuring at least 6-7 cm in diameter  at the hepatic flexure.  Biopsy showed adenocarcinoma.  The patient was  evaluated as an outpatient.  As I recall, he had a preoperative CT scan  which showed no findings of metastatic disease and a small nonspecific left  adrenal nodule.  He underwent bowel prep at home and was brought to the  hospital electively.   PHYSICAL EXAMINATION:  GENERAL:  A very pleasant morbidly obese gentleman in  no distress.  VITAL SIGNS:  Height 5 feet 11 inches, weight 418 pounds.  Blood pressure  157/98, pulse 102.  ABDOMEN:  Significant physical findings were limited to the abdomen which  was morbidly obese, soft, nontender.  No obvious organomegaly.   HOSPITAL COURSE:  On the day of admission, the patient was taken to the  operating room and underwent a right colectomy.  The surgery was uneventful  other than the patient's very large body habitus.   Pathology report returned showing that the tumor was a stage T3, N0 with  0/17 nodes showing  metastatic disease.  The patient was seen in the hospital  by Dr. Cephas Darby who said that he would screen the patient for  hereditary non-polyposis genes and would assess his eligibility for clinical  trials and randomized to either chemotherapy plus/minute Avastin as an  outpatient.   Postoperatively, the patient did relatively well.  He had an ileus for  several days, and we started giving him milk of magnesia on the sixth  postoperative day, and he started opening up a little bit, tolerating a full  liquid diet thereafter.  He advanced his diet steadily thereafter, became  mobilized, was ambulatory and did well.  He was maintained on high-dose  Lovenox for DVT prophylaxis during his hospital course.  He was discharged  on July 04, 2006.  At that time he was tolerating a regular diet, had had  2 bowel movements, was feeling well.  Abdomen was soft, and has had no wound  problems.  He was asked to follow up with me in my office in 1 week, and he  was to call Dr. Lucien Mons to arrange outpatient follow-up.   DISCHARGED MEDICATIONS:  1. Multivitamins.  2. Vicodin.  3. Iron tablets.   His last hemoglobin was 8.6.      Angelia Mould. Derrell Lolling, M.D.  Electronically Signed     HMI/MEDQ  D:  08/15/2006  T:  08/15/2006  Job:  [id number]   cc:   Fayrene Fearing L. Malon Kindle., M.D.  Fax: [phone]   Genene Churn. Cyndie Chime, M.D.  Fax: [phone]

## 2011-02-10 NOTE — Op Note (Signed)
NAMEDEVARION, MCCLANAHAN             ACCOUNT NO.:  [id number]   MEDICAL RECORD NO.:  [id number]          PATIENT TYPE:  INP   LOCATION:  0159                         FACILITY:  [facility]   PHYSICIAN:  Angelia Mould. Derrell Lolling, M.D.DATE OF BIRTH:  Mar 11, 1966   DATE OF PROCEDURE:  06/26/2006  DATE OF DISCHARGE:                                 OPERATIVE REPORT   PREOPERATIVE DIAGNOSIS:  Adenocarcinoma of the hepatic flexure of the colon.   POSTOPERATIVE DIAGNOSIS:  Adenocarcinoma of the hepatic flexure of the  colon.   OPERATION PERFORMED:  Right colectomy.   SURGEON:  Angelia Mould. Derrell Lolling, M.D.   FIRST ASSISTANT:  Alfonse Ras, M.D.   OPERATIVE INDICATIONS:  This is a 45 year old black man with morbid obesity,  weight 420 pounds, and sleep apnea.  He has had some nausea and seen little  bit of blood in his stools.  He has a brother who had colon cancer at age  69.  He had colonoscopy on May 24, 2006, which revealed a large malignant  mass at the hepatic flexure.  Biopsy confirmed colonic adenocarcinoma.  We  were able to get a CT scan, which showed no findings of metastatic disease  and a small nonspecific left adrenal nodule.  He has undergone a bowel prep  at home and is brought to the operating room electively.   OPERATIVE FINDINGS:  The patient had a palpable mass at least the size of  golf ball, perhaps slightly larger, just distal to the hepatic flexure.  This did not appear to have invaded the serosa.  There were no palpably  enlarged lymph nodes.  There was no palpable mass in the liver.  No other  gross abnormalities were detected.   OPERATIVE TECHNIQUE:  Following the induction of general endotracheal  anesthesia, a Foley catheter was inserted.  The abdomen was prepped and  draped in sterile fashion.  Intravenous antibiotics were given.  The patient  was identified.  An upper midline incision was made.  Dissection was carried  down through the very deep subcutaneous tissue to  the fascia.  The fascia  was incised in the midline and the abdominal cavity was entered under direct  vision.  Self-retaining retractors were placed to facilitate exposure.  Exploration was carried out with findings as described above.   We began by mobilizing the terminal ileum.  There were a couple of loops of  terminal ileum that were adherent to the retroperitoneum, but with division  of the lateral peritoneal tissues, we were able to mobilize this up quite  nicely, mobilizing about 18 inches of terminal ileum.  We then mobilized the  ascending colon and hepatic flexure by dividing lateral peritoneal  attachments and mobilizing the colon to the midline.  We identified the  duodenum, which was not injured.  We cleaned off the right transverse colon  about 6 cm distal to the palpable tumor.  We divided the colon with a GIA  stapling device.  We divided the terminal ileum about 3 inches proximal to  the ileocecal valve also with a GIA stapling device.  We dissected the  right  colon mesentery widely away from the tumor, dividing the smaller vessels  with the LigaSure device and the large ileocolic vessel was doubly clamped,  divided and then doubly ligated with 2-0 silk ties.  The specimen was  removed.  The specimen was sent to Pathology.  The pathologist identified  the tumor and said that we had good margins on both ends, at least 6 cm on  the distal margin.  Routine histology will be performed.   An anastomosis was created between the terminal ileum and the right  transverse colon with a GIA stapling device.  The staple lines looked  healthy; there was no bleeding.  The defect in the bowel wall was closed  with a TA-60 stapling device.  At this point, we changed our instruments and  gloves and suction devices.  The staple line was inspected; it appeared  intact.  A few extra sutures of 3-0 silk were placed to reinforce the staple  line in strategic places.  The mesentery was closed  with interrupted sutures  of 2-0 silk.  We copiously irrigated the abdomen and pelvis and subphrenic  space with saline.  There was actually very little bleeding and hemostasis  was good.  We put Tisseel on the staple line and let it harden.  The omentum  was returned to its anatomic position on the top of the colon and small  bowel.  The midline fascia was closed with a running suture of double-  stranded #1 PDS and about 5 interrupted sutures of #1 Novofil.  The wound  was copiously irrigated with saline.  The skin was closed with skin staples,  nylon sutures and I placed some long Telfa wicks down in between the staples  in 5 or 6 areas to promote drainage.  A clean bandage was placed and the  patient taken to the recovery room in stable condition.  Estimated blood  loss was about 75-100 mL.  Complications -- none.  Sponge, needle and  instrument count were correct.      Angelia Mould. Derrell Lolling, M.D.  Electronically Signed     HMI/MEDQ  D:  06/26/2006  T:  06/27/2006  Job:  [id number]   cc:   Fayrene Fearing L. Malon Kindle., M.D.  Fax: [phone]   Deboraha Sprang at [facility]

## 2011-02-10 NOTE — Consult Note (Signed)
NAMEHANDSOME, ANGLIN             ACCOUNT NO.:  [id number]   MEDICAL RECORD NO.:  [id number]          PATIENT TYPE:  INP   LOCATION:  1603                         [facility]ACILITY:  [facility]   PHYSICIAN:  Genene Churn. Granfortuna, M.D.DATE OF BIRTH:  [birth year]   DATE OF CONSULTATION:  07/02/2006  DATE OF DISCHARGE:                                   CONSULTATION   This is a medical oncology consultation requested to evaluate this man with  a newly diagnosed stage II colon cancer.   HISTORY:  Mr. Ells is a pleasant, 45 year old man who has been in overall  excellent health without any major medical or surgical illness.  One of his  brothers who is now 56 years old was diagnosed with colon cancer at age 48.  In view of this and in view of some intermittent rectal bleeding, Tomoya  had his first colonoscopy in 2002 with findings of internal hemorrhoids.  He  had a follow up study in 2003 and per hospital notes, he had some benign  polyps removed at that time.   Since about July of this year, he has noted postprandial nausea usually in  the afternoon and intermittent tenesmus with the sense that he had to have a  bowel movement right away but then only passed gas.  He has not had any  change in his bowel habit.  No change in the caliber of his stool.  No  hematochezia or melena.  He has noted intermittent blood on the toilet  tissue when he wipes himself, but really attributed this to his hemorrhoids.  However, when he reported these symptoms to his primary care physician, she  encouraged him to get a follow up colonoscopy.  This was done on 05/24/2006  by Dr. Carman Ching.  He was found to have a large tumor estimated at 6-7  cm at the hepatic flexure.  The tumor was ulcerated.  Biopsies returned  positive for adenocarcinoma.  He was admitted here by Dr. Derrell Lolling on  06/26/2006 and underwent a right hemicolectomy.  A preoperative CT scan of  the abdomen done at [facility] which I have  personally reviewed on  06/08/2006.  It showed no obvious metastatic disease.  A nonspecific left  adrenal nodule felt to be likely a benign adenoma was seen.  A preoperative  chest radiograph on 06/22/2006 was normal.  Preop laboratory showed a normal  CEA of 1.6.  He had a significant microcytic anemia with hemoglobin of 8.3,  MCV 58 on 06/22/2006.  Pathology shows a 4 cm moderately differentiated adenocarcinoma with focal  penetration through the muscularis, no vascular or lymphatic invasion, and  17 mesenteric lymph nodes are negative for tumor. T3NoMo stage II.   Of note is the fact that he is morbidly obese, weighing 185 kg (420 pounds).  He is 6 feet tall.   PAST MEDICAL HISTORY:  Remarkable for sleep apnea diagnosed in 2002 for  which she uses a CPAP machine at night.  No other major surgical procedures.  No history of hypertension, heart disease, diabetes, ulcers, he does have  some intermittent reflux, no  history of hepatitis, yellow jaundice, thyroid  disease, kidney disease, bleeding or clotting problems, or seizure disorder.  Only medicine on admission was a multivitamin.   ALLERGIES.:  None.   FAMILY HISTORY:  As stated, brother who is now 44 years old was diagnosed  with colon cancer at age 68 and initially declined treatment until he had  local progression, and then he required chemotherapy.  One other older  brother who is healthy.  Two younger sisters who are healthy.  Mother died  at age 70 of pancreatic cancer.  Father alive and healthy at age 34.   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  He is married.  He has four children  2 girls and 2 boys, ages 70-15.  He does not smoke.  He has one or two  alcoholic beverages per year.  No history of exposure to any toxic  chemicals.   REVIEW OF SYSTEMS:  Unremarkable except as stated above.  He has had no loss  of appetite.  No weight loss.  No  abdominal pain or cramping.   PHYSICAL EXAM:  VITAL SIGNS:  6 feet tall 240 pounds.  Initial blood  pressure 133/75, pulse 84 regular, respirations 16, temperature 98.5.  HEENT:  Pupils were equal, reactive to light.  Pharynx no erythema or  exudate.  NECK:  Supple.  No thyromegaly or thyroid mass.  Carotids 2+.  LUNGS:  Clear.  Resonant to percussion.  HEART:  Regular cardiac rhythm.  No murmur.  ABDOMEN:  Soft, obese, nontender.  Midline surgical incision which is  healing nicely.  No obvious organomegaly.  EXTREMITIES:  No edema.  No calf tenderness.  NEUROLOGIC:  Mental status intact.  Cranial nerves intact.  Motor strength  is 5/5.  Reflexes 2+ symmetric upper body coordination normal.  Gait not  tested.   ADDITIONAL PERTINENT LABS:  Admission CBC with a hemoglobin of 9.2,  hematocrit 30, MCV 62, white count 12,000 with 60% neutrophils, 28  lymphocytes, platelets 353,000.  Chem profile be done on 09/28 showed mild  elevation of SGOT at 43, SGPT at 57 with normal alkaline phosphatase 54 and  bilirubin 1.1.  CEA 1.6 as noted above.   IMPRESSION:  1. Stage II T3N0M0 moderately differentiated adenocarcinoma of the      transverse colon.    Adjuvant chemotherapy is not recommended for the average stage II patient,  but this man is far from average.  In view of his young age and strong  family history, I have believe that he should receive some additional  treatment when he recuperates from the surgery.  However, what the optimal  treatment regimen should be remains open to debate.    He will be screened for hereditary nonpolyposis colon cancer genes.    I will assess his eligibility for current cancer and leukemia Group B  clinical trial which randomizes patients to aggressive FOLFOX chemotherapy  with or without Avastin versus observation alone based on additional genetic  studies that will be done on the tumor tissue.   I discussed a broad overview of his diagnosis and treatment  options today  together with his wife.  I will arrange for outpatient follow up for a more  detailed discussion.   Thank you for this consultation.      Genene Churn. Cyndie Chime, M.D.  Electronically Signed     JMG/MEDQ  D:  07/02/2006  T:  07/03/2006  Job:  [id number]   cc:   Angelia Mould. Derrell Lolling, M.D.  [address], [address]  [place]  [place] [address]   Lavonda Jumbo, M.D.  Fax: [phone]   Llana Aliment. Malon Kindle., M.D.  Fax: [phone]

## 2011-03-03 ENCOUNTER — Encounter (HOSPITAL_COMMUNITY): Payer: BC Managed Care – PPO | Admitting: Licensed Clinical Social Worker

## 2011-07-03 ENCOUNTER — Encounter (HOSPITAL_COMMUNITY): Payer: BC Managed Care – PPO | Admitting: Licensed Clinical Social Worker

## 2011-07-03 DIAGNOSIS — F329 Major depressive disorder, single episode, unspecified: Secondary | ICD-10-CM

## 2011-07-10 LAB — URINALYSIS, ROUTINE W REFLEX MICROSCOPIC
Bilirubin Urine: NEGATIVE
Glucose, UA: NEGATIVE
Hgb urine dipstick: NEGATIVE
Ketones, ur: NEGATIVE
Nitrite: NEGATIVE
Protein, ur: NEGATIVE
Specific Gravity, Urine: 1.026
Urobilinogen, UA: 0.2
pH: 6.5

## 2011-07-10 LAB — I-STAT 8, (EC8 V) (CONVERTED LAB)
BUN: 7
Bicarbonate: 26.7 — ABNORMAL HIGH
Chloride: 106
Glucose, Bld: 127 — ABNORMAL HIGH
HCT: 52
Hemoglobin: 17.7 — ABNORMAL HIGH
Operator id: 151321
Potassium: 3.7
Sodium: 143
TCO2: 28
pCO2, Ven: 48.6
pH, Ven: 7.347 — ABNORMAL HIGH

## 2011-07-10 LAB — CBC
HCT: 47.3
Hemoglobin: 15.7
MCHC: 33.2
MCV: 88.6
Platelets: 244
RBC: 5.34
RDW: 19.3 — ABNORMAL HIGH
WBC: 6.1

## 2011-07-10 LAB — DIFFERENTIAL
Basophils Absolute: 0
Basophils Relative: 0
Eosinophils Absolute: 0
Eosinophils Relative: 0
Lymphocytes Relative: 15
Lymphs Abs: 0.9
Monocytes Absolute: 0.3
Monocytes Relative: 5
Neutro Abs: 4.9
Neutrophils Relative %: 80 — ABNORMAL HIGH

## 2011-07-10 LAB — POCT I-STAT CREATININE
Creatinine, Ser: 1
Operator id: 151321

## 2011-07-11 ENCOUNTER — Other Ambulatory Visit (HOSPITAL_COMMUNITY): Payer: Self-pay

## 2011-07-19 ENCOUNTER — Encounter (INDEPENDENT_AMBULATORY_CARE_PROVIDER_SITE_OTHER): Payer: BC Managed Care – PPO | Admitting: Licensed Clinical Social Worker

## 2011-07-19 DIAGNOSIS — F329 Major depressive disorder, single episode, unspecified: Secondary | ICD-10-CM

## 2011-10-04 ENCOUNTER — Encounter (HOSPITAL_COMMUNITY): Payer: BC Managed Care – PPO | Admitting: Licensed Clinical Social Worker

## 2011-10-06 ENCOUNTER — Ambulatory Visit (INDEPENDENT_AMBULATORY_CARE_PROVIDER_SITE_OTHER): Payer: BC Managed Care – PPO | Admitting: Surgery

## 2011-10-06 ENCOUNTER — Encounter (INDEPENDENT_AMBULATORY_CARE_PROVIDER_SITE_OTHER): Payer: Self-pay | Admitting: Surgery

## 2011-10-06 DIAGNOSIS — G4733 Obstructive sleep apnea (adult) (pediatric): Secondary | ICD-10-CM

## 2011-10-06 DIAGNOSIS — C189 Malignant neoplasm of colon, unspecified: Secondary | ICD-10-CM

## 2011-10-06 DIAGNOSIS — E669 Obesity, unspecified: Secondary | ICD-10-CM

## 2011-10-06 DIAGNOSIS — K439 Ventral hernia without obstruction or gangrene: Secondary | ICD-10-CM

## 2011-10-06 NOTE — Progress Notes (Signed)
Chief Complaint:  Morbid obesity BMI 60  History of Present Illness:  Cameron Jones is an 46 y.o. male who I have seen before with morbid obesity. He is a patient of Dr. Derrell Lolling. In October 2007 he underwent a right hemicolectomy. It was a T3 N0 colon cancer. Postoperatively he has developed a ventral incisional hernia. He has been to see [place] before regarding possible weight loss surgery and a study this well and wants a lap band. I think that this would be a good choice particularly with his prior abdominal surgery and bowel surgery. Also his BMI my preclude Roux-en-Y.we'll move forward with workup for a laparoscopic adjustable gastric band.  Past Medical History  Diagnosis Date  . Cancer     colon  . Sleep apnea   . Lower back pain   . Arthritis     Past Surgical History  Procedure Date  . Colon surgery 06/2006    colectomy    Current Outpatient Prescriptions  Medication Sig Dispense Refill  . aspirin 325 MG tablet Take 325 mg by mouth every 4 (four) hours as needed.      Marland Kitchen ibuprofen (ADVIL,MOTRIN) 200 MG tablet Take 200 mg by mouth every 6 (six) hours as needed.       Review of patient's allergies indicates no known allergies. Family History  Problem Relation Age of Onset  . Cancer Mother     pancreatic  . Cancer Brother     colon  . Cancer Maternal Aunt     breast   Social History:   reports that he has never smoked. He does not have any smokeless tobacco history on file. He reports that he does not drink alcohol or use illicit drugs.   REVIEW OF SYSTEMS - PERTINENT POSITIVES ONLY: noncontributory  Physical Exam:   Blood pressure 166/98, pulse 92, temperature 96.8 F (36 C), temperature source Temporal, resp. rate 18, height 5\' 11"  (1.803 m), weight 435 lb (197.315 kg). Body mass index is 60.67 kg/(m^2).  Gen:  WDWN African American male NAD  Neurological: Alert and oriented to person, place, and time. Motor and sensory function is grossly intact  Head:  Normocephalic and atraumatic.  Eyes: Conjunctivae are normal. Pupils are equal, round, and reactive to light. No scleral icterus.  Neck: Normal range of motion. Neck supple. No tracheal deviation or thyromegaly present.  Cardiovascular:  SR without murmurs or gallops.  No carotid bruits Respiratory: Effort normal.  No respiratory distress. No chest wall tenderness. Breath sounds normal.  No wheezes, rales or rhonchi.  Abdomen:  A broad-based ventral incisional hernia for which he wears a support. GU: Musculoskeletal: Normal range of motion. Extremities are nontender. No cyanosis, edema or clubbing noted Lymphadenopathy: No cervical, preauricular, postauricular or axillary adenopathy is present Skin: Skin is warm and dry. No rash noted. No diaphoresis. No erythema. No pallor. Pscyh: Normal mood and affect. Behavior is normal. Judgment and thought content normal.   LABORATORY RESULTS: No results found for this or any previous visit (from the past 48 hour(s)).  RADIOLOGY RESULTS: No results found.  Problem List: Patient Active Problem List  Diagnoses  . Colon cancer-right T3 N0, Oct 2007  . Ventral hernia-midline  . Obstructive sleep apnea of adult  . Obesity BMI 61    Assessment & Plan: Morbid obesity BMI 60. Begin workup and turning toward a laparoscopic adjustable gastric banding.    Matt B. Daphine Deutscher, MD, [facility] [phone] beeper [phone]  10/06/2011 4:57  PM

## 2011-10-12 ENCOUNTER — Other Ambulatory Visit (INDEPENDENT_AMBULATORY_CARE_PROVIDER_SITE_OTHER): Payer: Self-pay | Admitting: General Surgery

## 2011-10-12 ENCOUNTER — Ambulatory Visit (HOSPITAL_COMMUNITY): Payer: BC Managed Care – PPO | Admitting: Licensed Clinical Social Worker

## 2011-10-16 ENCOUNTER — Other Ambulatory Visit (INDEPENDENT_AMBULATORY_CARE_PROVIDER_SITE_OTHER): Payer: Self-pay | Admitting: General Surgery

## 2011-10-23 ENCOUNTER — Other Ambulatory Visit: Payer: Self-pay

## 2011-10-23 ENCOUNTER — Ambulatory Visit (HOSPITAL_COMMUNITY)
Admission: RE | Admit: 2011-10-23 | Discharge: 2011-10-23 | Disposition: A | Discharge status: Home / Self Care | Payer: BC Managed Care – PPO | Source: Ambulatory Visit | Attending: Surgery | Admitting: Surgery

## 2011-10-23 ENCOUNTER — Other Ambulatory Visit (HOSPITAL_COMMUNITY): Payer: BC Managed Care – PPO

## 2011-10-23 DIAGNOSIS — Z01818 Encounter for other preprocedural examination: Secondary | ICD-10-CM | POA: Insufficient documentation

## 2011-10-23 DIAGNOSIS — K7689 Other specified diseases of liver: Secondary | ICD-10-CM | POA: Insufficient documentation

## 2011-10-23 DIAGNOSIS — C189 Malignant neoplasm of colon, unspecified: Secondary | ICD-10-CM | POA: Insufficient documentation

## 2011-10-23 DIAGNOSIS — Z01810 Encounter for preprocedural cardiovascular examination: Secondary | ICD-10-CM | POA: Insufficient documentation

## 2011-10-23 DIAGNOSIS — Z9884 Bariatric surgery status: Secondary | ICD-10-CM

## 2011-10-24 ENCOUNTER — Encounter (HOSPITAL_COMMUNITY): Payer: Self-pay | Admitting: Licensed Clinical Social Worker

## 2011-10-24 ENCOUNTER — Ambulatory Visit (INDEPENDENT_AMBULATORY_CARE_PROVIDER_SITE_OTHER): Payer: BC Managed Care – PPO | Admitting: Licensed Clinical Social Worker

## 2011-10-24 DIAGNOSIS — F329 Major depressive disorder, single episode, unspecified: Secondary | ICD-10-CM

## 2011-10-24 NOTE — Progress Notes (Signed)
   THERAPIST PROGRESS NOTE  Session Time: 8:30am-9:20am  Participation Level: Active  Behavioral Response: Well GroomedAlertEuthymic  Type of Therapy: Individual Therapy  Treatment Goals addressed: Anxiety, Communication: with his wife and Coping  Interventions: CBT, Motivational Interviewing and Supportive  Summary: Cameron Jones is a 46 y.o. male who presents with euthymic mood and affect. This is patients first appointment since October, since this therapist was out on leave. He reports continued and increased marital stress. He discusses stress at home and at his job and describes arguments that he and his wife continue to have. Patient remains angry at his wife for treating him poorly. He describes interactions where he is put down, dismissed and disrespected. He reports staying in the marriage because he is unable to leave financially and is uncertain who would provide care to his children. He is preparing for Lap Band surgery and continues to struggle with emotional and addictive eating patterns. His sleep is wnl and his appetite remains high.    Suicidal/Homicidal: Nowithout intent/plan  Therapist Response: Processed w/pt his feelings regarding his wife. Explored his ambivalence about marital counseling and challenged his statements of " we just need to work things out". Patient does not fully accept the deteriorated state of his marriage. Used motivational interviewing to assist and encourage patient through the change process. Explored patients barriers to change. Used CBT to assist patient with the identification of his negative distortions and irrational thoughts. Encouraged patient to verbalize alternative and factual responses which challenge his distortions. Reviewed patients self care plan. Assessed his progress related to self care. Patient's self care is fair. Recommend proper diet, regular exercise, socialization and recreation.   Plan: Return again in three  weeks.  Diagnosis: Axis I: Depressive Disorder NOS    Axis II: No diagnosis    Cameron Yordy, LCSW 10/24/2011

## 2011-10-27 ENCOUNTER — Other Ambulatory Visit: Payer: Self-pay | Admitting: Oncology

## 2011-10-27 DIAGNOSIS — C189 Malignant neoplasm of colon, unspecified: Secondary | ICD-10-CM

## 2011-11-01 ENCOUNTER — Encounter: Payer: BC Managed Care – PPO | Attending: Surgery | Admitting: *Deleted

## 2011-11-01 ENCOUNTER — Encounter: Payer: Self-pay | Admitting: *Deleted

## 2011-11-01 DIAGNOSIS — Z01818 Encounter for other preprocedural examination: Secondary | ICD-10-CM | POA: Insufficient documentation

## 2011-11-01 DIAGNOSIS — Z713 Dietary counseling and surveillance: Secondary | ICD-10-CM | POA: Insufficient documentation

## 2011-11-01 NOTE — Patient Instructions (Signed)
   Follow Pre-Op Nutrition Goals to prepare for LAGB Surgery.   Call the [facility] at [phone] once you have been given your surgery date to enrolled in the Pre-Op Nutrition Class. You will need to attend this nutrition class 3-4 weeks prior to your surgery. 

## 2011-11-01 NOTE — Progress Notes (Signed)
  Pre-Op Assessment Visit: Pre-Operative LAGB Surgery  Medical Nutrition Therapy:  Appt start time: 1130 end time:  1230.  Patient was seen on 11/01/2011 for Pre-Operative LAGB Nutrition Assessment. Assessment and letter of approval faxed to [facility] Program coordinator on 11/01/2011.  Approval letter sent to [facility] center and will be available in the chart under the media tab.  Handouts given during visit include:  Pre-Op Goals Handout  Patient to call for Pre-Op and Post-Op Nutrition Education at the [facility] when surgery is scheduled.

## 2011-11-07 ENCOUNTER — Ambulatory Visit (HOSPITAL_COMMUNITY)
Admission: RE | Admit: 2011-11-07 | Discharge: 2011-11-07 | Disposition: A | Discharge status: Home / Self Care | Payer: BC Managed Care – PPO | Source: Ambulatory Visit | Attending: Surgery | Admitting: Surgery

## 2011-11-07 ENCOUNTER — Encounter (HOSPITAL_COMMUNITY)
Admission: RE | Disposition: A | Discharge status: Home / Self Care | Payer: Self-pay | Source: Ambulatory Visit | Attending: Surgery

## 2011-11-07 DIAGNOSIS — Z01818 Encounter for other preprocedural examination: Secondary | ICD-10-CM | POA: Insufficient documentation

## 2011-11-07 HISTORY — PX: BREATH TEK H PYLORI: SHX5422

## 2011-11-07 SURGERY — BREATH TEST, FOR HELICOBACTER PYLORI

## 2011-11-08 ENCOUNTER — Encounter (HOSPITAL_COMMUNITY): Payer: Self-pay | Admitting: Surgery

## 2011-11-08 ENCOUNTER — Encounter (HOSPITAL_COMMUNITY): Payer: Self-pay

## 2011-11-09 ENCOUNTER — Telehealth (INDEPENDENT_AMBULATORY_CARE_PROVIDER_SITE_OTHER): Payer: Self-pay | Admitting: General Surgery

## 2011-11-09 NOTE — Telephone Encounter (Signed)
Left a message for the patient to contact our office regarding positive breath tek assesment. Prev paccalled to  [facility] [phone], left on Voicemail, #28 take 1 po  Bid x 14 days

## 2011-11-10 ENCOUNTER — Inpatient Hospital Stay (HOSPITAL_COMMUNITY)
Admission: EM | Admit: 2011-11-10 | Discharge: 2011-11-14 | DRG: 188 | Disposition: A | Discharge status: Home / Self Care | Payer: BC Managed Care – PPO | Attending: General Surgery | Admitting: General Surgery

## 2011-11-10 ENCOUNTER — Encounter (HOSPITAL_COMMUNITY): Payer: Self-pay | Admitting: *Deleted

## 2011-11-10 ENCOUNTER — Emergency Department (HOSPITAL_COMMUNITY): Payer: BC Managed Care – PPO

## 2011-11-10 DIAGNOSIS — Z85038 Personal history of other malignant neoplasm of large intestine: Secondary | ICD-10-CM

## 2011-11-10 DIAGNOSIS — F329 Major depressive disorder, single episode, unspecified: Secondary | ICD-10-CM | POA: Diagnosis present

## 2011-11-10 DIAGNOSIS — F3289 Other specified depressive episodes: Secondary | ICD-10-CM | POA: Diagnosis present

## 2011-11-10 DIAGNOSIS — K56609 Unspecified intestinal obstruction, unspecified as to partial versus complete obstruction: Secondary | ICD-10-CM

## 2011-11-10 DIAGNOSIS — D696 Thrombocytopenia, unspecified: Secondary | ICD-10-CM | POA: Diagnosis present

## 2011-11-10 DIAGNOSIS — G4730 Sleep apnea, unspecified: Secondary | ICD-10-CM | POA: Diagnosis present

## 2011-11-10 DIAGNOSIS — Z9049 Acquired absence of other specified parts of digestive tract: Secondary | ICD-10-CM

## 2011-11-10 DIAGNOSIS — M1710 Unilateral primary osteoarthritis, unspecified knee: Secondary | ICD-10-CM | POA: Diagnosis present

## 2011-11-10 DIAGNOSIS — K430 Incisional hernia with obstruction, without gangrene: Secondary | ICD-10-CM

## 2011-11-10 DIAGNOSIS — R109 Unspecified abdominal pain: Secondary | ICD-10-CM | POA: Diagnosis present

## 2011-11-10 DIAGNOSIS — F411 Generalized anxiety disorder: Secondary | ICD-10-CM | POA: Diagnosis present

## 2011-11-10 DIAGNOSIS — R112 Nausea with vomiting, unspecified: Secondary | ICD-10-CM | POA: Diagnosis present

## 2011-11-10 DIAGNOSIS — Z6841 Body Mass Index (BMI) 40.0 and over, adult: Secondary | ICD-10-CM

## 2011-11-10 HISTORY — DX: Unspecified abdominal hernia without obstruction or gangrene: K46.9

## 2011-11-10 LAB — COMPREHENSIVE METABOLIC PANEL
ALT: 70 U/L — ABNORMAL HIGH (ref 0–53)
AST: 43 U/L — ABNORMAL HIGH (ref 0–37)
Alkaline Phosphatase: 58 U/L (ref 39–117)
CO2: 26 mEq/L (ref 19–32)
Calcium: 10 mg/dL (ref 8.4–10.5)
Chloride: 102 mEq/L (ref 96–112)
GFR calc non Af Amer: >90 mL/min (ref >90)
Glucose, Bld: 122 mg/dL — ABNORMAL HIGH (ref 70–99)
Potassium: 3.9 mEq/L (ref 3.5–5.1)
Sodium: 140 mEq/L (ref 135–145)

## 2011-11-10 LAB — CBC
HCT: 45.6 % (ref 39.0–52.0)
Hemoglobin: 16 g/dL (ref 13.0–17.0)
MCH: 29.4 pg (ref 26.0–34.0)
Platelets: 213 10*3/uL (ref 150–400)
RBC: 5.91 MIL/uL — ABNORMAL HIGH (ref 4.22–5.81)
RBC: 5.45 MIL/uL (ref 4.22–5.81)
RDW: 13.8 % (ref 11.5–15.5)
WBC: 12.8 10*3/uL — ABNORMAL HIGH (ref 4.0–10.5)

## 2011-11-10 LAB — URINALYSIS, ROUTINE W REFLEX MICROSCOPIC
Bilirubin Urine: NEGATIVE
Glucose, UA: NEGATIVE mg/dL
Hgb urine dipstick: NEGATIVE
Protein, ur: NEGATIVE mg/dL
Specific Gravity, Urine: 1.034 — ABNORMAL HIGH (ref 1.005–1.030)

## 2011-11-10 LAB — DIFFERENTIAL
Basophils Absolute: 0 10*3/uL (ref 0.0–0.1)
Eosinophils Relative: 0 % (ref 0–5)
Lymphocytes Relative: 6 % — ABNORMAL LOW (ref 12–46)
Lymphs Abs: 0.8 10*3/uL (ref 0.7–4.0)
Neutro Abs: 11 10*3/uL — ABNORMAL HIGH (ref 1.7–7.7)
Neutrophils Relative %: 86 % — ABNORMAL HIGH (ref 43–77)

## 2011-11-10 MED ORDER — MORPHINE SULFATE 4 MG/ML IJ SOLN
4.0000 mg | Freq: Once | INTRAMUSCULAR | Status: AC
  Administered 2011-11-10: 6 mg via INTRAVENOUS

## 2011-11-10 MED ORDER — MORPHINE SULFATE 10 MG/ML IJ SOLN
INTRAMUSCULAR | Status: AC
  Administered 2011-11-10: 13:00:00
  Filled 2011-11-10: qty 1

## 2011-11-10 MED ORDER — HEPARIN SODIUM (PORCINE) 5000 UNIT/ML IJ SOLN
5000.0000 [IU] | Freq: Three times a day (TID) | INTRAMUSCULAR | Status: DC
  Administered 2011-11-10 – 2011-11-13 (×8): 5000 [IU] via SUBCUTANEOUS
  Filled 2011-11-10 (×11): qty 1

## 2011-11-10 MED ORDER — SODIUM CHLORIDE 0.9 % IV BOLUS (SEPSIS)
1000.0000 mL | Freq: Once | INTRAVENOUS | Status: AC
  Administered 2011-11-10: 1000 mL via INTRAVENOUS

## 2011-11-10 MED ORDER — KETOROLAC TROMETHAMINE 30 MG/ML IJ SOLN
30.0000 mg | Freq: Three times a day (TID) | INTRAMUSCULAR | Status: DC | PRN
  Administered 2011-11-11: 30 mg via INTRAVENOUS
  Filled 2011-11-10: qty 1

## 2011-11-10 MED ORDER — PANTOPRAZOLE SODIUM 40 MG IV SOLR
40.0000 mg | Freq: Every day | INTRAVENOUS | Status: DC
  Administered 2011-11-10 – 2011-11-13 (×4): 40 mg via INTRAVENOUS
  Filled 2011-11-10 (×5): qty 40

## 2011-11-10 MED ORDER — ONDANSETRON HCL 4 MG/2ML IJ SOLN: 4.0000 mg | Freq: Four times a day (QID) | INTRAMUSCULAR | Status: DC | PRN

## 2011-11-10 MED ORDER — KETOROLAC TROMETHAMINE 30 MG/ML IJ SOLN
30.0000 mg | Freq: Once | INTRAMUSCULAR | Status: AC
  Administered 2011-11-10: 30 mg via INTRAVENOUS
  Filled 2011-11-10: qty 1

## 2011-11-10 MED ORDER — MORPHINE SULFATE 2 MG/ML IJ SOLN: 2.0000 mg | INTRAMUSCULAR | Status: DC | PRN

## 2011-11-10 MED ORDER — POTASSIUM CHLORIDE IN NACL 20-0.9 MEQ/L-% IV SOLN
INTRAVENOUS | Status: DC
  Administered 2011-11-10 – 2011-11-12 (×6): via INTRAVENOUS
  Administered 2011-11-12: 75 mL/h via INTRAVENOUS
  Administered 2011-11-13 (×2): via INTRAVENOUS
  Filled 2011-11-10 (×13): qty 1000

## 2011-11-10 MED ORDER — IOHEXOL 300 MG/ML  SOLN
125.0000 mL | Freq: Once | INTRAMUSCULAR | Status: AC | PRN
  Administered 2011-11-10: 125 mL via INTRAVENOUS

## 2011-11-10 MED ORDER — ENOXAPARIN SODIUM 40 MG/0.4ML ~~LOC~~ SOLN: 40.0000 mg | SUBCUTANEOUS | Status: DC

## 2011-11-10 NOTE — ED Notes (Signed)
Pt in c/o upper abd pain, states he has a hernia and has a history of similar pain, typically he is able to lay over a pillow to help with pain but today he has vomited multiple times, states the pain has changed and feels different that normal

## 2011-11-10 NOTE — ED Provider Notes (Signed)
History     CSN: [id number]  Arrival date & time 11/10/11  0808   First MD Initiated Contact with Patient 11/10/11 0809      Chief Complaint  Patient presents with  . Abdominal Pain    (Consider location/radiation/quality/duration/timing/severity/associated sxs/prior treatment) HPI Comments: Patient presents with a history of morbid obesity, colectomy due to colon cancer, ventral hernia, recent positive H. pylori breath test -- presents with abdominal pain for the past 12 hours and vomiting for the past 6 hours. Patient states that his abdominal pain is where his hernia is located and is similar to previous pain he has had. However h his pain and is not typically last this long and is not associated with vomiting. Patient had a soft bowel movement early this morning. He denies fever or urinary symptoms. Patient states he is not able to feel his hernia like he usually can.  Patient is a 46 y.o. male presenting with abdominal pain. The history is provided by the patient.  Abdominal Pain The primary symptoms of the illness include abdominal pain, nausea, vomiting and diarrhea. The primary symptoms of the illness do not include fever, shortness of breath or dysuria. The current episode started 6 to 12 hours ago.  The patient has had a change in bowel habit.    Past Medical History  Diagnosis Date  . Cancer     colon  . Sleep apnea   . Lower back pain   . Anxiety   . Depression   . Obesity   . Arthritis     Knee  . Hernia     Past Surgical History  Procedure Date  . Colon surgery 06/2006    colectomy  . Breath tek h pylori 11/07/2011    Procedure: BREATH TEK H PYLORI;  Surgeon: Kandis Cocking, MD;  Location: [facility];  Service: General;  Laterality: N/A;    Family History  Problem Relation Age of Onset  . Cancer Mother     pancreatic  . Cancer Brother     colon  . Cancer Maternal Aunt     breast  . Alzheimer's disease Father   . Depression Brother     History    Substance Use Topics  . Smoking status: Never Smoker   . Smokeless tobacco: Not on file  . Alcohol Use: 0.5 oz/week    1 drink(s) per week     infrequnet social drinker one drink in six months      Review of Systems  Constitutional: Negative for fever.  HENT: Negative for sore throat and rhinorrhea.   Eyes: Negative for redness.  Respiratory: Negative for cough and shortness of breath.   Cardiovascular: Negative for chest pain.  Gastrointestinal: Positive for nausea, vomiting, abdominal pain and diarrhea. Negative for blood in stool.  Genitourinary: Negative for dysuria.  Musculoskeletal: Negative for myalgias.  Skin: Negative for rash.  Neurological: Negative for headaches.    Allergies  Review of patient's allergies indicates no known allergies.  Home Medications   Current Outpatient Rx  Name Route Sig Dispense Refill  . ASPIRIN 325 MG PO TABS Oral Take 325 mg by mouth every 4 (four) hours as needed.      BP 174/70  Pulse 101  Temp(Src) 98.8 F (37.1 C) (Oral)  Resp 20  SpO2 100%  Physical Exam  Nursing note and vitals reviewed. Constitutional: He is oriented to person, place, and time. He appears well-developed and well-nourished.       Super morbid  obesity  HENT:  Head: Normocephalic and atraumatic.  Eyes: Conjunctivae are normal. Right eye exhibits no discharge. Left eye exhibits no discharge.  Neck: Normal range of motion. Neck supple.  Cardiovascular: Normal rate, regular rhythm and normal heart sounds.   Pulmonary/Chest: Effort normal and breath sounds normal.  Abdominal: Soft. Bowel sounds are normal. He exhibits no distension. There is no tenderness. There is no rebound and no guarding.       Exam is difficult due to habitus. No palpable masses felt lower abdomen.  Neurological: He is alert and oriented to person, place, and time.  Skin: Skin is warm and dry.  Psychiatric: He has a normal mood and affect.    ED Course  Procedures (including  critical care time)  Labs Reviewed  CBC - Abnormal; Notable for the following:    WBC 12.8 (*)    RBC 5.91 (*)    Hemoglobin 17.5 (*)    All other components within normal limits  DIFFERENTIAL - Abnormal; Notable for the following:    Neutrophils Relative 86 (*)    Neutro Abs 11.0 (*)    Lymphocytes Relative 6 (*)    All other components within normal limits  COMPREHENSIVE METABOLIC PANEL - Abnormal; Notable for the following:    Glucose, Bld 122 (*)    Total Protein 8.6 (*)    AST 43 (*)    ALT 70 (*)    All other components within normal limits  LACTIC ACID, PLASMA - Abnormal; Notable for the following:    Lactic Acid, Venous 2.6 (*)    All other components within normal limits  URINALYSIS, ROUTINE W REFLEX MICROSCOPIC - Abnormal; Notable for the following:    Specific Gravity, Urine 1.034 (*)    All other components within normal limits  LACTIC ACID, PLASMA - Abnormal; Notable for the following:    Lactic Acid, Venous 3.8 (*)    All other components within normal limits  CBC  CREATININE, SERUM   Ct Abdomen Pelvis W Contrast  11/10/2011  *RADIOLOGY REPORT*  Clinical Data: Abdominal pain, vomiting, history of ventral hernia  CT ABDOMEN AND PELVIS WITH CONTRAST  Technique:  Multidetector CT imaging of the abdomen and pelvis was performed following the standard protocol during bolus administration of intravenous contrast.  Contrast: OMNIPAQUE IOHEXOL 300 MG/ML IV SOLN  Comparison: 11/02/2010  Findings: There are streak artifacts from the patient's large body habitus.  Fatty infiltration of the liver again noted.  Sagittal images of the spine shows no destructive bony lesions.  The spleen, pancreas and left adrenal are stable.  Stable 1.80 cm left adrenal adenoma. Kidneys are symmetrical in size and enhancement.  No focal renal mass.  No hydronephrosis or hydroureter.  Delayed renal images shows bilateral renal symmetrical excretion.  No calcified gallstones are noted within  gallbladder.  No aortic aneurysm.  There is no evidence of ascites or free abdominal air.  Stable postsurgical changes post partial colectomy.  Contrast material noted within the stomach and proximal duodenum.  Moderate distention of multiple small bowel loops with fluid without transition point in caliber.  Findings may be due to enteritis, ileus or early bowel obstruction.  Again noted a midline ventral hernia containing omental fat and small bowel loops.  The entering and exiting small bowel loops has similar mild distended caliber. There is no evidence of acute incarceration.  A second ventral hernia site inferiorly in the left anterior abdominal wall adjacent to midline is also containing small bowel loops  and omental fat. There is no evidence of acute incarceration.  The entering and exiting small bowel loops has similar caliber.  The small bowel colonic anastomosis is stable.  Some fluid is noted within colon.  There is no evidence of colonic obstruction.  No adenopathy is noted.  No destructive bony lesions are noted within pelvis.  The urinary bladder is unremarkable.  Fluid noted rectosigmoid colon without significant distention.  Prostate gland seminal vesicles are unremarkable.  IMPRESSION:  1.  Fatty infiltration of the liver again noted. 2.  Moderate fluid distended small bowel loops throughout the abdomen without transition point in caliber.  Findings may be due to small bowel ileus, enteritis or early bowel obstruction. Clinical correlation is necessary.  Stable midline ventral hernia containing omental fat and small bowel loops.  Stable second ventral hernia left anterior abdominal wall containing omental fat and small bowel loop without evidence of acute incarceration. 3.  Stable postsurgical changes post partial colectomy. 4.  No distal colonic obstruction. 5.  Stable left adrenal adenoma.  Original Report Authenticated By: Natasha Mead, M.D.     1. Incarcerated incisional hernia   2. Nausea and  vomiting     8:46 AM Patient seen and examined. Work-up initiated. Patient refusing medications for pain and nausea. Vital signs reviewed and are as follows: Filed Vitals:   11/10/11 0811  BP: 174/70  Pulse: 101  Temp: 98.8 F (37.1 C)  Resp: 20   8:46 AM Patient was discussed with [name], MD  Blood work and CT ordered. Will rule out obstruction and incarcerated hernia.  10:50 AM Labs and CT reviewed by myself and Dr. Ranae Palms.   12:02 PM CT results reviewed. D/w Dr. Ranae Palms who will call surgery for consult/admit.   MDM  Admit for hernia, possible SBO        Carolee Rota, [place] 11/10/11 [phone]

## 2011-11-10 NOTE — H&P (Signed)
Cameron Jones is an 46 y.o. male.   Chief Complaint: Abdominal pain, nausea, vomiting and loose stools HPI: Patient is a 46 year old gentleman who was recently seen by Cameron Jones for weight loss surgery. He is currently undergoing evaluation for possible lap band procedure. He has a history of colon cancer T3N0, 06/2006. He developed a hernia shortly after that surgery. He comes out fairly frequently, and he can normally reduce it by lying down on his stomach with a pillow underneath. Last night he developed some pain tried laying down. He woke up around 3 AM, and had a bowel movement, which was followed by nausea and then vomiting. He had several episodes of this throughout the early a.m. he presented to the emergency room and 8 AM. He's had a bowel movement and some nausea since then, but no further vomiting. Workup in the emergency room shows stable postsurgical partial colectomy. There is contrast material noted in the stomach and proximal duodenum distention of multiple small bowel loops with air-fluid without a transition point in caliber. There is a midline ventral hernia containing omental fat of small bowel loops entering and exiting the hernia. A second ventral hernias noted inferiorly left anterior abdominal wall adjacent to the midline it also contains a small bowel loops, no evidence of incarceration. WBC is elevated at 12.8. Hemoglobin 17.5 hematocrit 50.8, lactic acid is 2.6. Other labs are normal. We were asked to see and evaluate.  Past Medical History  Diagnosis Date  . Sleep apnea   . Lower back pain   . Anxiety   . Depression   . Obesity   . Arthritis     Knee  . Hernia   . Cancer 2007 followed by DR. Grandfortuna    colon    Past Surgical History  Procedure Date  . Colon surgery 06/2006 Cameron Jones    colectomy  . Breath tek h pylori 11/07/2011    Procedure: BREATH TEK H PYLORI;  Surgeon: Cameron Murphy, MD;  Location: [facility];  Service: General;   Laterality: N/A;    Family History  Problem Relation Age of Onset  . Cancer Mother     pancreatic  . Cancer Brother     colon  . Cancer Maternal Aunt     breast  . Alzheimer's disease Father   . Depression Brother    Social History:  reports that he has never smoked. He does not have any smokeless tobacco history on file. He reports that he drinks about .5 ounces of alcohol per week. He reports that he does not use illicit drugs.  Allergies: No Known Allergies  Medications Prior to Admission  Medication Dose Route Frequency Provider Last Rate Last Dose  . iohexol (OMNIPAQUE) 300 MG/ML solution 125 mL  125 mL Intravenous Once PRN Medication Radiologist, MD   125 mL at 11/10/11 1025  . morphine 10 MG/ML injection           . morphine 4 MG/ML injection 4-6 mg  4-6 mg Intravenous Once Cameron Jones, [place]      . sodium chloride 0.9 % bolus 1,000 mL  1,000 mL Intravenous Once Cameron Racer, MD   1,000 mL at 11/10/11 0910   No current outpatient prescriptions on file as of 11/10/2011. He  Was on asprin 325 mg qd, none for 4 weeks.    Results for orders placed during the hospital encounter of 11/10/11 (from the past 48 hour(s))  CBC     Status: Abnormal  Collection Time   11/10/11  9:10 AM      Component Value Range Comment   WBC 12.8 (*) 4.0 - 10.5 (K/uL)    RBC 5.91 (*) 4.22 - 5.81 (MIL/uL)    Hemoglobin 17.5 (*) 13.0 - 17.0 (g/dL)    HCT 16.1  09.6 - 04.5 (%)    MCV 86.0  78.0 - 100.0 (fL)    MCH 29.6  26.0 - 34.0 (pg)    MCHC 34.4  30.0 - 36.0 (g/dL)    RDW 40.9  81.1 - 91.4 (%)    Platelets 213  150 - 400 (K/uL)   DIFFERENTIAL     Status: Abnormal   Collection Time   11/10/11  9:10 AM      Component Value Range Comment   Neutrophils Relative 86 (*) 43 - 77 (%)    Neutro Abs 11.0 (*) 1.7 - 7.7 (K/uL)    Lymphocytes Relative 6 (*) 12 - 46 (%)    Lymphs Abs 0.8  0.7 - 4.0 (K/uL)    Monocytes Relative 8  3 - 12 (%)    Monocytes Absolute 1.0  0.1 - 1.0 (K/uL)     Eosinophils Relative 0  0 - 5 (%)    Eosinophils Absolute 0.0  0.0 - 0.7 (K/uL)    Basophils Relative 0  0 - 1 (%)    Basophils Absolute 0.0  0.0 - 0.1 (K/uL)   COMPREHENSIVE METABOLIC PANEL     Status: Abnormal   Collection Time   11/10/11  9:10 AM      Component Value Range Comment   Sodium 140  135 - 145 (mEq/L)    Potassium 3.9  3.5 - 5.1 (mEq/L)    Chloride 102  96 - 112 (mEq/L)    CO2 26  19 - 32 (mEq/L)    Glucose, Bld 122 (*) 70 - 99 (mg/dL)    BUN 11  6 - 23 (mg/dL)    Creatinine, Ser 7.82  0.50 - 1.35 (mg/dL)    Calcium 95.6  8.4 - 10.5 (mg/dL)    Total Protein 8.6 (*) 6.0 - 8.3 (g/dL)    Albumin 4.5  3.5 - 5.2 (g/dL)    AST 43 (*) 0 - 37 (U/L)    ALT 70 (*) 0 - 53 (U/L)    Alkaline Phosphatase 58  39 - 117 (U/L)    Total Bilirubin 0.4  0.3 - 1.2 (mg/dL)    GFR calc non Af Amer >90  >90 (mL/min)    GFR calc Af Amer >90  >90 (mL/min)   LACTIC ACID, PLASMA     Status: Abnormal   Collection Time   11/10/11  9:10 AM      Component Value Range Comment   Lactic Acid, Venous 2.6 (*) 0.5 - 2.2 (mmol/L)   URINALYSIS, ROUTINE W REFLEX MICROSCOPIC     Status: Abnormal   Collection Time   11/10/11 10:45 AM      Component Value Range Comment   Color, Urine YELLOW  YELLOW     APPearance CLEAR  CLEAR     Specific Gravity, Urine 1.034 (*) 1.005 - 1.030     pH 6.0  5.0 - 8.0     Glucose, UA NEGATIVE  NEGATIVE (mg/dL)    Hgb urine dipstick NEGATIVE  NEGATIVE     Bilirubin Urine NEGATIVE  NEGATIVE     Ketones, ur NEGATIVE  NEGATIVE (mg/dL)    Protein, ur NEGATIVE  NEGATIVE (mg/dL)  Urobilinogen, UA 0.2  0.0 - 1.0 (mg/dL)    Nitrite NEGATIVE  NEGATIVE     Leukocytes, UA NEGATIVE  NEGATIVE  MICROSCOPIC NOT DONE ON URINES WITH NEGATIVE PROTEIN, BLOOD, LEUKOCYTES, NITRITE, OR GLUCOSE <1000 mg/dL.   Ct Abdomen Pelvis W Contrast  11/10/2011  *RADIOLOGY REPORT*  Clinical Data: Abdominal pain, vomiting, history of ventral hernia  CT ABDOMEN AND PELVIS WITH CONTRAST  Technique:   Multidetector CT imaging of the abdomen and pelvis was performed following the standard protocol during bolus administration of intravenous contrast.  Contrast: OMNIPAQUE IOHEXOL 300 MG/ML IV SOLN  Comparison: 11/02/2010  Findings: There are streak artifacts from the patient's large body habitus.  Fatty infiltration of the liver again noted.  Sagittal images of the spine shows no destructive bony lesions.  The spleen, pancreas and left adrenal are stable.  Stable 1.80 cm left adrenal adenoma. Kidneys are symmetrical in size and enhancement.  No focal renal mass.  No hydronephrosis or hydroureter.  Delayed renal images shows bilateral renal symmetrical excretion.  No calcified gallstones are noted within gallbladder.  No aortic aneurysm.  There is no evidence of ascites or free abdominal air.  Stable postsurgical changes post partial colectomy.  Contrast material noted within the stomach and proximal duodenum.  Moderate distention of multiple small bowel loops with fluid without transition point in caliber.  Findings may be due to enteritis, ileus or early bowel obstruction.  Again noted a midline ventral hernia containing omental fat and small bowel loops.  The entering and exiting small bowel loops has similar mild distended caliber. There is no evidence of acute incarceration.  A second ventral hernia site inferiorly in the left anterior abdominal wall adjacent to midline is also containing small bowel loops and omental fat. There is no evidence of acute incarceration.  The entering and exiting small bowel loops has similar caliber.  The small bowel colonic anastomosis is stable.  Some fluid is noted within colon.  There is no evidence of colonic obstruction.  No adenopathy is noted.  No destructive bony lesions are noted within pelvis.  The urinary bladder is unremarkable.  Fluid noted rectosigmoid colon without significant distention.  Prostate gland seminal vesicles are unremarkable.  IMPRESSION:  1.   Fatty infiltration of the liver again noted. 2.  Moderate fluid distended small bowel loops throughout the abdomen without transition point in caliber.  Findings may be due to small bowel ileus, enteritis or early bowel obstruction. Clinical correlation is necessary.  Stable midline ventral hernia containing omental fat and small bowel loops.  Stable second ventral hernia left anterior abdominal wall containing omental fat and small bowel loop without evidence of acute incarceration. 3.  Stable postsurgical changes post partial colectomy. 4.  No distal colonic obstruction. 5.  Stable left adrenal adenoma.  Original Report Authenticated By: Natasha Mead, M.D.    Review of Systems  Constitutional: Positive for chills. Negative for fever, weight loss, malaise/fatigue and diaphoresis.  HENT: Negative.   Eyes: Negative.   Respiratory: Negative.   Cardiovascular: Negative.   Gastrointestinal: Positive for nausea, vomiting and abdominal pain.       Multiple stools, followed by N/V since early this AM.  Genitourinary: Negative.   Musculoskeletal: Negative.   Skin: Negative.   Neurological: Negative.  Negative for weakness.  Endo/Heme/Allergies: Negative.   Psychiatric/Behavioral: Negative.     Blood pressure 174/70, pulse 101, temperature 98.8 F (37.1 C), temperature source Oral, resp. rate 20, SpO2 100.00%. Physical Exam  Constitutional: He  is oriented to person, place, and time. He appears well-developed and well-nourished.       BMI 60  HENT:  Head: Normocephalic and atraumatic.  Nose: Nose normal.  Eyes: Conjunctivae and EOM are normal. Pupils are equal, round, and reactive to light. Left eye exhibits no discharge.  Neck: Normal range of motion. Neck supple. No JVD present. No tracheal deviation present. No thyromegaly present.  Cardiovascular: Regular rhythm, normal heart sounds and intact distal pulses.  Exam reveals no friction rub.   No murmur heard.      TACHYCARDIC HR 112    Respiratory: Effort normal and breath sounds normal. No respiratory distress. He has no wheezes. He has no rales. He exhibits no tenderness.  GI: Soft. Bowel sounds are normal. He exhibits no distension and no mass. There is tenderness (DIFFUSELY TENDRER ALL OVER.). There is no rebound and no guarding.  Musculoskeletal: He exhibits no edema and no tenderness.  Lymphadenopathy:    He has no cervical adenopathy.  Neurological: He is alert and oriented to person, place, and time. He has normal reflexes. No cranial nerve deficit.  Skin: Skin is warm and dry. No rash noted.  Psychiatric: He has a normal mood and affect. His behavior is normal. Judgment and thought content normal.     Assessment/Plan 1.Ventral Hernia with SBO. 2.Hx of Right Hemicolectomy T3N0 2007, Dr. Derrell Lolling 3.Obesity, BMI 60, undergoing evaluation for Weight loss surgery 4.+ H. Pylori breath tek  Plan:  Admit, hydrate, NG decompression, pain relief, and see if we can get him decompressed enough for it to reduce on it's own. Abdomen is actually soft with moderate tenderness centrally over hernia but no guarding or peritonitis. CT does not look like hernia is obstructing bowel. Will hydrate, rest bowel, and try to decompress JENNINGS,WILLARD 11/10/2011, 1:40 PM

## 2011-11-10 NOTE — ED Notes (Signed)
Patient arrives to room 27 via stretcher.  A/O, pleasant, cooperative.

## 2011-11-10 NOTE — ED Provider Notes (Signed)
Medical screening examination/treatment/procedure(s) were conducted as a shared visit with non-physician practitioner(s) and myself.  I personally evaluated the patient during the encounter  [name], MD 11/10/11 [phone]

## 2011-11-10 NOTE — ED Notes (Signed)
[id number]<AV> Expected date:<BR> Expected time:<BR> Means of arrival:<BR> Comments:<BR> Hold for [place]

## 2011-11-11 ENCOUNTER — Inpatient Hospital Stay (HOSPITAL_COMMUNITY): Payer: BC Managed Care – PPO

## 2011-11-11 LAB — BASIC METABOLIC PANEL
BUN: 11 mg/dL (ref 6–23)
CO2: 22 mEq/L (ref 19–32)
Calcium: 8.6 mg/dL (ref 8.4–10.5)
Glucose, Bld: 98 mg/dL (ref 70–99)
Potassium: 3.6 mEq/L (ref 3.5–5.1)
Sodium: 139 mEq/L (ref 135–145)

## 2011-11-11 LAB — CBC
HCT: 43.2 % (ref 39.0–52.0)
MCV: 84.5 fL (ref 78.0–100.0)
RBC: 5.11 MIL/uL (ref 4.22–5.81)
WBC: 5.3 10*3/uL (ref 4.0–10.5)

## 2011-11-11 NOTE — Progress Notes (Signed)
Patient ID: Cameron Jones, male   DOB: [birth year], 46 y.o.   MRN: [id number] [facility] Progress Note:   * No surgery found *  Subjective: Mental status is clear Objective: Vital signs in last 24 hours: Temp:  [97.9 F (36.6 C)-99.7 F (37.6 C)] 98.4 F (36.9 C) (02/16 0605) Pulse Rate:  [88-114] 88  (02/16 0605) Resp:  [20] 20  (02/16 0605) BP: (115-144)/(58-76) 126/76 mmHg (02/16 0605) SpO2:  [97 %-98 %] 97 % (02/16 0605) Weight:  [434 lb 1.4 oz (196.9 kg)-435 lb (197.315 kg)] 435 lb (197.315 kg) (02/15 2122)  Intake/Output from previous day: 02/15 0701 - 02/16 0700 In: 1692.5 [I.V.:1662.5; NG/GT:30] Out: 550 [Urine:550] Intake/Output this shift:    Physical Exam: Work of breathing is  Normal.  Had small BM.    Lab Results:  Results for orders placed during the hospital encounter of 11/10/11 (from the past 48 hour(s))  CBC     Status: Abnormal   Collection Time   11/10/11  9:10 AM      Component Value Range Comment   WBC 12.8 (*) 4.0 - 10.5 (K/uL)    RBC 5.91 (*) 4.22 - 5.81 (MIL/uL)    Hemoglobin 17.5 (*) 13.0 - 17.0 (g/dL)    HCT 40.9  81.1 - 91.4 (%)    MCV 86.0  78.0 - 100.0 (fL)    MCH 29.6  26.0 - 34.0 (pg)    MCHC 34.4  30.0 - 36.0 (g/dL)    RDW 78.2  95.6 - 21.3 (%)    Platelets 213  150 - 400 (K/uL)   DIFFERENTIAL     Status: Abnormal   Collection Time   11/10/11  9:10 AM      Component Value Range Comment   Neutrophils Relative 86 (*) 43 - 77 (%)    Neutro Abs 11.0 (*) 1.7 - 7.7 (K/uL)    Lymphocytes Relative 6 (*) 12 - 46 (%)    Lymphs Abs 0.8  0.7 - 4.0 (K/uL)    Monocytes Relative 8  3 - 12 (%)    Monocytes Absolute 1.0  0.1 - 1.0 (K/uL)    Eosinophils Relative 0  0 - 5 (%)    Eosinophils Absolute 0.0  0.0 - 0.7 (K/uL)    Basophils Relative 0  0 - 1 (%)    Basophils Absolute 0.0  0.0 - 0.1 (K/uL)   COMPREHENSIVE METABOLIC PANEL     Status: Abnormal   Collection Time   11/10/11  9:10 AM      Component Value Range Comment   Sodium  140  135 - 145 (mEq/L)    Potassium 3.9  3.5 - 5.1 (mEq/L)    Chloride 102  96 - 112 (mEq/L)    CO2 26  19 - 32 (mEq/L)    Glucose, Bld 122 (*) 70 - 99 (mg/dL)    BUN 11  6 - 23 (mg/dL)    Creatinine, Ser 0.86  0.50 - 1.35 (mg/dL)    Calcium 57.8  8.4 - 10.5 (mg/dL)    Total Protein 8.6 (*) 6.0 - 8.3 (g/dL)    Albumin 4.5  3.5 - 5.2 (g/dL)    AST 43 (*) 0 - 37 (U/L)    ALT 70 (*) 0 - 53 (U/L)    Alkaline Phosphatase 58  39 - 117 (U/L)    Total Bilirubin 0.4  0.3 - 1.2 (mg/dL)    GFR calc non Af Amer >90  >90 (mL/min)  GFR calc Af Amer >90  >90 (mL/min)   LACTIC ACID, PLASMA     Status: Abnormal   Collection Time   11/10/11  9:10 AM      Component Value Range Comment   Lactic Acid, Venous 2.6 (*) 0.5 - 2.2 (mmol/L)   URINALYSIS, ROUTINE W REFLEX MICROSCOPIC     Status: Abnormal   Collection Time   11/10/11 10:45 AM      Component Value Range Comment   Color, Urine YELLOW  YELLOW     APPearance CLEAR  CLEAR     Specific Gravity, Urine 1.034 (*) 1.005 - 1.030     pH 6.0  5.0 - 8.0     Glucose, UA NEGATIVE  NEGATIVE (mg/dL)    Hgb urine dipstick NEGATIVE  NEGATIVE     Bilirubin Urine NEGATIVE  NEGATIVE     Ketones, ur NEGATIVE  NEGATIVE (mg/dL)    Protein, ur NEGATIVE  NEGATIVE (mg/dL)    Urobilinogen, UA 0.2  0.0 - 1.0 (mg/dL)    Nitrite NEGATIVE  NEGATIVE     Leukocytes, UA NEGATIVE  NEGATIVE  MICROSCOPIC NOT DONE ON URINES WITH NEGATIVE PROTEIN, BLOOD, LEUKOCYTES, NITRITE, OR GLUCOSE <1000 mg/dL.  LACTIC ACID, PLASMA     Status: Abnormal   Collection Time   11/10/11  2:39 PM      Component Value Range Comment   Lactic Acid, Venous 3.8 (*) 0.5 - 2.2 (mmol/L)   CBC     Status: Normal   Collection Time   11/10/11  4:26 PM      Component Value Range Comment   WBC 9.6  4.0 - 10.5 (K/uL)    RBC 5.45  4.22 - 5.81 (MIL/uL)    Hemoglobin 16.0  13.0 - 17.0 (g/dL)    HCT 72.5  36.6 - 44.0 (%)    MCV 83.7  78.0 - 100.0 (fL)    MCH 29.4  26.0 - 34.0 (pg)    MCHC 35.1  30.0 - 36.0  (g/dL)    RDW 34.7  42.5 - 95.6 (%)    Platelets 301  150 - 400 (K/uL)   BASIC METABOLIC PANEL     Status: Normal   Collection Time   11/11/11  4:35 AM      Component Value Range Comment   Sodium 139  135 - 145 (mEq/L)    Potassium 3.6  3.5 - 5.1 (mEq/L)    Chloride 105  96 - 112 (mEq/L)    CO2 22  19 - 32 (mEq/L)    Glucose, Bld 98  70 - 99 (mg/dL)    BUN 11  6 - 23 (mg/dL)    Creatinine, Ser 3.87  0.50 - 1.35 (mg/dL)    Calcium 8.6  8.4 - 10.5 (mg/dL)    GFR calc non Af Amer >90  >90 (mL/min)    GFR calc Af Amer >90  >90 (mL/min)   MAGNESIUM     Status: Normal   Collection Time   11/11/11  4:35 AM      Component Value Range Comment   Magnesium 1.6  1.5 - 2.5 (mg/dL)   CBC     Status: Abnormal   Collection Time   11/11/11  4:35 AM      Component Value Range Comment   WBC 5.3  4.0 - 10.5 (K/uL)    RBC 5.11  4.22 - 5.81 (MIL/uL)    Hemoglobin 14.6  13.0 - 17.0 (g/dL)    HCT 56.4  33.2 -  52.0 (%)    MCV 84.5  78.0 - 100.0 (fL)    MCH 28.6  26.0 - 34.0 (pg)    MCHC 33.8  30.0 - 36.0 (g/dL)    RDW 08.6  57.8 - 46.9 (%)    Platelets 120 (*) 150 - 400 (K/uL)     Radiology/Results: Ct Abdomen Pelvis W Contrast  11/10/2011  *RADIOLOGY REPORT*  Clinical Data: Abdominal pain, vomiting, history of ventral hernia  CT ABDOMEN AND PELVIS WITH CONTRAST  Technique:  Multidetector CT imaging of the abdomen and pelvis was performed following the standard protocol during bolus administration of intravenous contrast.  Contrast: OMNIPAQUE IOHEXOL 300 MG/ML IV SOLN  Comparison: 11/02/2010  Findings: There are streak artifacts from the patient's large body habitus.  Fatty infiltration of the liver again noted.  Sagittal images of the spine shows no destructive bony lesions.  The spleen, pancreas and left adrenal are stable.  Stable 1.80 cm left adrenal adenoma. Kidneys are symmetrical in size and enhancement.  No focal renal mass.  No hydronephrosis or hydroureter.  Delayed renal images shows  bilateral renal symmetrical excretion.  No calcified gallstones are noted within gallbladder.  No aortic aneurysm.  There is no evidence of ascites or free abdominal air.  Stable postsurgical changes post partial colectomy.  Contrast material noted within the stomach and proximal duodenum.  Moderate distention of multiple small bowel loops with fluid without transition point in caliber.  Findings may be due to enteritis, ileus or early bowel obstruction.  Again noted a midline ventral hernia containing omental fat and small bowel loops.  The entering and exiting small bowel loops has similar mild distended caliber. There is no evidence of acute incarceration.  A second ventral hernia site inferiorly in the left anterior abdominal wall adjacent to midline is also containing small bowel loops and omental fat. There is no evidence of acute incarceration.  The entering and exiting small bowel loops has similar caliber.  The small bowel colonic anastomosis is stable.  Some fluid is noted within colon.  There is no evidence of colonic obstruction.  No adenopathy is noted.  No destructive bony lesions are noted within pelvis.  The urinary bladder is unremarkable.  Fluid noted rectosigmoid colon without significant distention.  Prostate gland seminal vesicles are unremarkable.  IMPRESSION:  1.  Fatty infiltration of the liver again noted. 2.  Moderate fluid distended small bowel loops throughout the abdomen without transition point in caliber.  Findings may be due to small bowel ileus, enteritis or early bowel obstruction. Clinical correlation is necessary.  Stable midline ventral hernia containing omental fat and small bowel loops.  Stable second ventral hernia left anterior abdominal wall containing omental fat and small bowel loop without evidence of acute incarceration. 3.  Stable postsurgical changes post partial colectomy. 4.  No distal colonic obstruction. 5.  Stable left adrenal adenoma.  Original Report  Authenticated By: Natasha Mead, M.D.   Dg Abd Portable 2v  11/11/2011  *RADIOLOGY REPORT*  Clinical Data: Abdominal pain, small bowel obstruction  PORTABLE ABDOMEN - 2 VIEW  Comparison: 11/10/2011  Findings: No free air on the erect film.  Nasogastric tube extends into the stomach.  Small bowel is decompressed.  There has been progression of the oral contrast material into the colon, which is decompressed.  No abnormal abdominal calcifications.  Visualized lung bases clear.  IMPRESSION:  1.  Nonobstructive bowel gas pattern with progression of oral contrast in the colon. 2.  Nasogastric tube to  the stomach.  Original Report Authenticated By: Osa Craver, M.D.    Anti-infectives: Anti-infectives    None      Assessment/Plan: Problem List: Patient Active Problem List  Diagnoses  . Colon cancer-right T3 N0, Oct 2007  . Ventral hernia-midline  . Obstructive sleep apnea of adult  . Obesity BMI 61    Oral contrast now in colon.  Will leave NG for now as patient not ready for its discharge yet.  Appears to be improving.   * No surgery found *    LOS: 1 day   Matt B. Daphine Deutscher, MD, [facility] [phone] beeper [phone]  11/11/2011 9:07 AM

## 2011-11-12 ENCOUNTER — Encounter (HOSPITAL_BASED_OUTPATIENT_CLINIC_OR_DEPARTMENT_OTHER): Payer: BC Managed Care – PPO

## 2011-11-12 NOTE — Progress Notes (Signed)
Patient ID: Cameron Jones, male   DOB: 1966-09-14, 46 y.o.   MRN: [id number] [facility] Progress Note:   * No surgery found *  Subjective: Mental status is alert, no complaints except NG tube.   Objective: Vital signs in last 24 hours: Temp:  [97.8 F (36.6 C)-99 F (37.2 C)] 99 F (37.2 C) (02/17 0500) Pulse Rate:  [76-85] 76  (02/17 0500) Resp:  [18-20] 20  (02/17 0500) BP: (127-137)/(65-81) 127/70 mmHg (02/17 0500) SpO2:  [96 %-98 %] 98 % (02/17 0500)  Intake/Output from previous day: 02/16 0701 - 02/17 0700 In: 2110.8 [I.V.:2110.8] Out: 3450 [Urine:2750; Emesis/NG output:700] Intake/Output this shift: Total I/O In: -  Out: 900 [Urine:900]  Physical Exam: Work of breathing is  normal  Lab Results:  Results for orders placed during the hospital encounter of 11/10/11 (from the past 48 hour(s))  LACTIC ACID, PLASMA     Status: Abnormal   Collection Time   11/10/11  2:39 PM      Component Value Range Comment   Lactic Acid, Venous 3.8 (*) 0.5 - 2.2 (mmol/L)   CBC     Status: Normal   Collection Time   11/10/11  4:26 PM      Component Value Range Comment   WBC 9.6  4.0 - 10.5 (K/uL)    RBC 5.45  4.22 - 5.81 (MIL/uL)    Hemoglobin 16.0  13.0 - 17.0 (g/dL)    HCT 91.4  78.2 - 95.6 (%)    MCV 83.7  78.0 - 100.0 (fL)    MCH 29.4  26.0 - 34.0 (pg)    MCHC 35.1  30.0 - 36.0 (g/dL)    RDW 21.3  08.6 - 57.8 (%)    Platelets 301  150 - 400 (K/uL)   BASIC METABOLIC PANEL     Status: Normal   Collection Time   11/11/11  4:35 AM      Component Value Range Comment   Sodium 139  135 - 145 (mEq/L)    Potassium 3.6  3.5 - 5.1 (mEq/L)    Chloride 105  96 - 112 (mEq/L)    CO2 22  19 - 32 (mEq/L)    Glucose, Bld 98  70 - 99 (mg/dL)    BUN 11  6 - 23 (mg/dL)    Creatinine, Ser 4.69  0.50 - 1.35 (mg/dL)    Calcium 8.6  8.4 - 10.5 (mg/dL)    GFR calc non Af Amer >90  >90 (mL/min)    GFR calc Af Amer >90  >90 (mL/min)   MAGNESIUM     Status: Normal   Collection Time   11/11/11  4:35 AM      Component Value Range Comment   Magnesium 1.6  1.5 - 2.5 (mg/dL)   CBC     Status: Abnormal   Collection Time   11/11/11  4:35 AM      Component Value Range Comment   WBC 5.3  4.0 - 10.5 (K/uL)    RBC 5.11  4.22 - 5.81 (MIL/uL)    Hemoglobin 14.6  13.0 - 17.0 (g/dL)    HCT 62.9  52.8 - 41.3 (%)    MCV 84.5  78.0 - 100.0 (fL)    MCH 28.6  26.0 - 34.0 (pg)    MCHC 33.8  30.0 - 36.0 (g/dL)    RDW 24.4  01.0 - 27.2 (%)    Platelets 120 (*) 150 - 400 (K/uL)     Radiology/Results:  Dg Abd Portable 2v  11/11/2011  *RADIOLOGY REPORT*  Clinical Data: Abdominal pain, small bowel obstruction  PORTABLE ABDOMEN - 2 VIEW  Comparison: 11/10/2011  Findings: No free air on the erect film.  Nasogastric tube extends into the stomach.  Small bowel is decompressed.  There has been progression of the oral contrast material into the colon, which is decompressed.  No abnormal abdominal calcifications.  Visualized lung bases clear.  IMPRESSION:  1.  Nonobstructive bowel gas pattern with progression of oral contrast in the colon. 2.  Nasogastric tube to the stomach.  Original Report Authenticated By: Osa Craver, M.D.    Anti-infectives: Anti-infectives    None      Assessment/Plan: Problem List: Patient Active Problem List  Diagnoses  . Colon cancer-right T3 N0, Oct 2007  . Ventral hernia-midline  . Obstructive sleep apnea of adult  . Obesity BMI 61    Doing better.  NG removed and clear liquids begun.  Hopeful discharge tomorrow. * No surgery found *    LOS: 2 days   Matt B. Daphine Deutscher, MD, [facility] [phone] beeper [phone]  11/12/2011 11:33 AM

## 2011-11-13 NOTE — Progress Notes (Signed)
Agree with above 

## 2011-11-13 NOTE — Progress Notes (Signed)
Patient ID: Cameron Jones, male   DOB: April 09, 1966, 46 y.o.   MRN: [id number]    Subjective: Pt feels well.  No c/o.  Tolerating clears.  Passing flatus and had a BM yesterday.  Ready to go home.  Objective: Vital signs in last 24 hours: Temp:  [98.2 F (36.8 C)-99.1 F (37.3 C)] 99 F (37.2 C) (02/18 0452) Pulse Rate:  [68-83] 68  (02/18 0452) Resp:  [18-20] 20  (02/18 0452) BP: (125-145)/(79-83) 145/83 mmHg (02/18 0452) SpO2:  [95 %-99 %] 96 % (02/18 0452) Last BM Date: 11/11/11  Intake/Output from previous day: 02/17 0701 - 02/18 0700 In: 3425.4 [P.O.:480; I.V.:2945.4] Out: 2050 [Urine:2050] Intake/Output this shift:    PE: Abd: soft, superior incisional hernia reduced completely with full defect palpable.  +BS, obese Heart: regular Lungs: CTAB  Lab Results:   Basename 11/11/11 0435 11/10/11 1626  WBC 5.3 9.6  HGB 14.6 16.0  HCT 43.2 45.6  PLT 120* 301   BMET  Basename 11/11/11 0435 11/10/11 0910  NA 139 140  K 3.6 3.9  CL 105 102  CO2 22 26  GLUCOSE 98 122*  BUN 11 11  CREATININE 0.87 0.97  CALCIUM 8.6 10.0   PT/INR No results found for this basename: LABPROT:2,INR:2 in the last 72 hours   Studies/Results: No results found.  Anti-infectives: Anti-infectives    None       Assessment/Plan  1. Incisional hernia, reduced 2. Thrombocytopenia, r/o HIT 3. Morbid obesity  Plan: 1. Will hold heparin and send a HIT panel just incase 2. Advance diet as tolerates. 3. Recheck labs in am 4. Hopefully, d/c home tomorrow am.    LOS: 3 days    Merl Bommarito E 11/13/2011

## 2011-11-14 LAB — CBC
MCH: 28.1 pg (ref 26.0–34.0)
MCV: 83.5 fL (ref 78.0–100.0)
Platelets: 191 10*3/uL (ref 150–400)
RBC: 5.34 MIL/uL (ref 4.22–5.81)
RDW: 13.4 % (ref 11.5–15.5)
WBC: 5.4 10*3/uL (ref 4.0–10.5)

## 2011-11-14 NOTE — Discharge Summary (Signed)
Patient ID: KAZUO DURNIL MRN: [id number] DOB/AGE: [birth year] 44 y.o.  Admit date: 11/10/2011 Discharge date: 11/14/2011  Procedures:  none  Consults: None  Reason for Admission:  This is a 46 yo morbidly obese male who has been seeing Dr. Wenda Low for weight loss surgery.  He had a colectomy for colon cancer several years ago.  He was noted to have an incisional hernia.  The night prior to admission he developed abdominal pain with nausea and vomiting.  He was unable to reduce his hernia.  He presented to the emergency department for further evaluation.  Admission Diagnoses: 1. Incisional hernia with SBO 2. Morbid obesity 3. Hx of colon CA  Hospital Course: The patient was admitted and an NGT was placed for decompression. Ultimately, his hernia was able to be reduced after decompression and pain relief.  The following day he was noted to have contrast in his colon c/w resolution of obstruction.  His NGT was discontinued and his diet was able to be advanced as tolerates.  By hospital day number 4, he was stable for discharge home.  PE:  Abd: soft, NT, ND, obese, hernia completely reduced with defect palpable.  Discharge Diagnoses:  1. Incisional hernia 2. Morbid obesity  Discharge Medications: None  Discharge Instructions: Follow-up Information    Follow up with FULP,CAMMIE J, MD.      Follow up with Luretha Murphy B, MD. Schedule an appointment as soon as possible for a visit in 3 weeks.   Contact information:   [employer] [address], [address] [place] [address] [phone]          Signed: Letha Cape 11/14/2011, 7:52 AM

## 2011-11-14 NOTE — Discharge Instructions (Signed)
Hernia °A hernia occurs when an internal organ pushes out through a weak spot in the abdominal wall. Hernias most commonly occur in the groin and around the navel. Hernias often can be pushed back into place (reduced). Most hernias tend to get worse over time. Some abdominal hernias can get stuck in the opening (irreducible or incarcerated hernia) and cannot be reduced. An irreducible abdominal hernia which is tightly squeezed into the opening is at risk for impaired blood supply (strangulated hernia). A strangulated hernia is a medical emergency. Because of the risk for an irreducible or strangulated hernia, surgery may be recommended to repair a hernia. °CAUSES  °· Heavy lifting.  °· Prolonged coughing.  °· Straining to have a bowel movement.  °· A cut (incision) made during an abdominal surgery.  °HOME CARE INSTRUCTIONS  °· Bed rest is not required. You may continue your normal activities.  °· Avoid lifting more than 10 pounds (4.5 kg) or straining.  °· Cough gently. If you are a smoker it is best to stop. Even the best hernia repair can break down with the continual strain of coughing. Even if you do not have your hernia repaired, a cough will continue to aggravate the problem.  °· Do not wear anything tight over your hernia. Do not try to keep it in with an outside bandage or truss. These can damage abdominal contents if they are trapped within the hernia sac.  °· Eat a normal diet.  °· Avoid constipation. Straining over long periods of time will increase hernia size and encourage breakdown of repairs. If you cannot do this with diet alone, stool softeners may be used.  °SEEK IMMEDIATE MEDICAL CARE IF:  °· You have a fever.  °· You develop increasing abdominal pain.  °· You feel nauseous or vomit.  °· Your hernia is stuck outside the abdomen, looks discolored, feels hard, or is tender.  °· You have any changes in your bowel habits or in the hernia that are unusual for you.  °· You have increased pain or  swelling around the hernia.  °· You cannot push the hernia back in place by applying gentle pressure while lying down.  °MAKE SURE YOU:  °· Understand these instructions.  °· Will watch your condition.  °· Will get help right away if you are not doing well or get worse.  °Document Released: 09/11/2005 Document Revised: 05/24/2011 Document Reviewed: 04/30/2008 °ExitCare® Patient Information ©2012 [employer]. °

## 2011-11-14 NOTE — Discharge Summary (Signed)
Doing much better, tol diet, has bowel function, will f/u in office

## 2011-11-15 LAB — HEPARIN INDUCED THROMBOCYTOPENIA PNL
UFH Low Dose 0.1 IU/mL: 0 % Release
UFH Low Dose 0.5 IU/mL: 0 % Release
UFH SRA Result: NEGATIVE

## 2011-11-23 ENCOUNTER — Telehealth: Payer: Self-pay | Admitting: *Deleted

## 2011-11-23 NOTE — Telephone Encounter (Signed)
Received vm call from pt stating that he wants to cancel upcoming CT & lab b/c he has been in the hosp & had a CT 11/10/11 & had a lot of labs & would like Dr.Granfortuna to review those.  He reports that he is starting the process for another surgery.  He can be reached at [phone].

## 2011-11-26 ENCOUNTER — Other Ambulatory Visit: Payer: Self-pay | Admitting: Oncology

## 2011-11-26 DIAGNOSIS — C189 Malignant neoplasm of colon, unspecified: Secondary | ICD-10-CM

## 2011-11-28 ENCOUNTER — Telehealth: Payer: Self-pay | Admitting: Oncology

## 2011-11-28 NOTE — Telephone Encounter (Signed)
Called pt, reminded him of appt on 11/29/11 lab, CT and md visit  a few days later

## 2011-12-14 ENCOUNTER — Telehealth: Payer: Self-pay | Admitting: *Deleted

## 2011-12-14 NOTE — Telephone Encounter (Signed)
Received vm call from pt stating that he has been in the hospital due to hernia & had CXR & CT in Feb & wants to cancel those scheduled for 12/19/11 due to high insurance deductibles.  Call back # is [phone].  Called pt & he is OK to keep MD appt for 12/26/11 & would like to get labs if needed the day before @ 8 or 8:30 am 12/25/11.  Will discuss with Dr. Cyndie Chime.

## 2011-12-18 ENCOUNTER — Other Ambulatory Visit: Payer: Self-pay

## 2011-12-18 NOTE — Progress Notes (Signed)
Per Dr Cyndie Chime - ok to cancel CXR & CT since pt had these performed as inpt.    Note to scheduling to cancel these studies and r/s labs from 3/26 to 4/1 per pt's request and Dr Patsy Lager approval. dph

## 2011-12-19 ENCOUNTER — Inpatient Hospital Stay (HOSPITAL_COMMUNITY): Admission: RE | Admit: 2011-12-19 | Payer: BC Managed Care – PPO | Source: Ambulatory Visit

## 2011-12-19 ENCOUNTER — Other Ambulatory Visit (HOSPITAL_COMMUNITY): Payer: BC Managed Care – PPO

## 2011-12-19 ENCOUNTER — Other Ambulatory Visit: Payer: BC Managed Care – PPO | Admitting: Lab

## 2011-12-21 ENCOUNTER — Telehealth: Payer: Self-pay | Admitting: Oncology

## 2011-12-21 NOTE — Telephone Encounter (Signed)
Per note from myrtle/JG ct scan not needed due to pt has scans in feb. Lb is needed for 4/1 and f/u as scheduled 4/2. Per myrtle pt would like lb appt for 4/1 @ 8 or 8:30 am. lmonvm for pt re appts for 4/1 and 4/2. Also informed pt via vm that ct does not need to be r/s.

## 2011-12-25 ENCOUNTER — Other Ambulatory Visit: Payer: BC Managed Care – PPO

## 2011-12-26 ENCOUNTER — Ambulatory Visit: Payer: BC Managed Care – PPO | Admitting: Oncology

## 2011-12-26 ENCOUNTER — Telehealth: Payer: Self-pay | Admitting: Oncology

## 2011-12-26 NOTE — Telephone Encounter (Signed)
Pt called today to r/s 4/2 appt to 6/10 @ 3:30 pm.

## 2012-01-02 ENCOUNTER — Encounter (HOSPITAL_COMMUNITY)
Admission: RE | Disposition: A | Discharge status: Home Health | Payer: Self-pay | Source: Ambulatory Visit | Attending: Surgery

## 2012-01-02 ENCOUNTER — Ambulatory Visit (HOSPITAL_COMMUNITY)
Admission: RE | Admit: 2012-01-02 | Discharge: 2012-01-02 | Disposition: A | Discharge status: Home Health | Payer: BC Managed Care – PPO | Source: Ambulatory Visit | Attending: Surgery | Admitting: Surgery

## 2012-01-02 HISTORY — PX: BREATH TEK H PYLORI: SHX5422

## 2012-01-02 SURGERY — BREATH TEST, FOR HELICOBACTER PYLORI

## 2012-01-03 ENCOUNTER — Encounter (HOSPITAL_COMMUNITY): Payer: Self-pay

## 2012-01-03 ENCOUNTER — Encounter (HOSPITAL_COMMUNITY): Payer: Self-pay | Admitting: Surgery

## 2012-02-09 ENCOUNTER — Ambulatory Visit (HOSPITAL_COMMUNITY)
Admission: RE | Admit: 2012-02-09 | Discharge: 2012-02-09 | Disposition: A | Discharge status: Home / Self Care | Payer: BC Managed Care – PPO | Source: Ambulatory Visit | Attending: Surgery | Admitting: Surgery

## 2012-02-09 DIAGNOSIS — Z01818 Encounter for other preprocedural examination: Secondary | ICD-10-CM | POA: Insufficient documentation

## 2012-02-12 ENCOUNTER — Ambulatory Visit (INDEPENDENT_AMBULATORY_CARE_PROVIDER_SITE_OTHER): Payer: BC Managed Care – PPO | Admitting: General Surgery

## 2012-02-20 ENCOUNTER — Other Ambulatory Visit (INDEPENDENT_AMBULATORY_CARE_PROVIDER_SITE_OTHER): Payer: Self-pay | Admitting: General Surgery

## 2012-02-20 DIAGNOSIS — E662 Morbid (severe) obesity with alveolar hypoventilation: Secondary | ICD-10-CM

## 2012-02-20 DIAGNOSIS — E669 Obesity, unspecified: Secondary | ICD-10-CM

## 2012-02-23 ENCOUNTER — Other Ambulatory Visit (INDEPENDENT_AMBULATORY_CARE_PROVIDER_SITE_OTHER): Payer: Self-pay | Admitting: Surgery

## 2012-02-24 LAB — TSH: TSH: 1.548 u[IU]/mL (ref 0.350–4.500)

## 2012-03-01 ENCOUNTER — Telehealth: Payer: Self-pay | Admitting: Oncology

## 2012-03-01 NOTE — Telephone Encounter (Signed)
Pt called and left message cancelling appt for 03/04/12 MD only

## 2012-03-04 ENCOUNTER — Ambulatory Visit: Payer: BC Managed Care – PPO | Admitting: Oncology

## 2012-04-01 ENCOUNTER — Other Ambulatory Visit: Payer: Self-pay | Admitting: *Deleted

## 2012-04-04 ENCOUNTER — Ambulatory Visit: Payer: BC Managed Care – PPO

## 2012-04-10 ENCOUNTER — Telehealth (INDEPENDENT_AMBULATORY_CARE_PROVIDER_SITE_OTHER): Payer: Self-pay | Admitting: General Surgery

## 2012-04-10 NOTE — Telephone Encounter (Signed)
Patient was scheduled for 04/25/12 for his pre op appt, had to rs and moved appt to 04/11/12 @430  a message was left on his voicemail to call in regards to this appt.

## 2012-04-11 ENCOUNTER — Ambulatory Visit (INDEPENDENT_AMBULATORY_CARE_PROVIDER_SITE_OTHER): Payer: BC Managed Care – PPO | Admitting: Surgery

## 2012-04-11 ENCOUNTER — Encounter (INDEPENDENT_AMBULATORY_CARE_PROVIDER_SITE_OTHER): Payer: Self-pay | Admitting: Surgery

## 2012-04-11 ENCOUNTER — Telehealth (INDEPENDENT_AMBULATORY_CARE_PROVIDER_SITE_OTHER): Payer: Self-pay | Admitting: General Surgery

## 2012-04-11 VITALS — BP 152/100 | HR 68 | Temp 97.6°F | Resp 18 | Ht 71.0 in | Wt >= 6400 oz

## 2012-04-11 DIAGNOSIS — E669 Obesity, unspecified: Secondary | ICD-10-CM

## 2012-04-11 NOTE — Patient Instructions (Signed)

## 2012-04-11 NOTE — Progress Notes (Signed)
Mr. Gienger comes in today and we had a frank discussion about Lapband v gastric sleeve.  We discussed the pros and cons.  I told him that I would try to get him approved for both procedures but he left the office stating that he would have a lapband.   I repeated his physical.  He has a ventral hernia that will needed to be addressed.  No new DVT .  Heart and Lung sounds OK  Plan lapband but patient will have both consents to sign and we will try to get approval for both.

## 2012-04-11 NOTE — Telephone Encounter (Signed)
Need to verify the patient coming to the office for preop

## 2012-04-24 ENCOUNTER — Telehealth (INDEPENDENT_AMBULATORY_CARE_PROVIDER_SITE_OTHER): Payer: Self-pay

## 2012-04-24 ENCOUNTER — Encounter (HOSPITAL_COMMUNITY): Payer: Self-pay | Admitting: Pharmacy Technician

## 2012-04-24 NOTE — Progress Notes (Signed)
Patient has preop appointment on 05/02/12 at 0830.  Surgery 05/07/12.  Need orders in EPIC . Thanks

## 2012-04-24 NOTE — Telephone Encounter (Signed)
Return patient call-- patient had questions regarding his RTW date after surgery.  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  Patient advised that he should plan at least 2 weeks out of work.

## 2012-04-25 ENCOUNTER — Encounter: Payer: Self-pay | Admitting: *Deleted

## 2012-04-25 ENCOUNTER — Encounter: Payer: BC Managed Care – PPO | Attending: Surgery | Admitting: *Deleted

## 2012-04-25 ENCOUNTER — Ambulatory Visit (INDEPENDENT_AMBULATORY_CARE_PROVIDER_SITE_OTHER): Payer: BC Managed Care – PPO | Admitting: Surgery

## 2012-04-25 VITALS — Ht 71.0 in | Wt >= 6400 oz

## 2012-04-25 DIAGNOSIS — Z01818 Encounter for other preprocedural examination: Secondary | ICD-10-CM | POA: Insufficient documentation

## 2012-04-25 DIAGNOSIS — E669 Obesity, unspecified: Secondary | ICD-10-CM

## 2012-04-25 DIAGNOSIS — Z713 Dietary counseling and surveillance: Secondary | ICD-10-CM | POA: Insufficient documentation

## 2012-04-25 NOTE — Progress Notes (Addendum)
  Bariatric Class:  Appt start time: 1730 end time:  1830.  Pre-Operative Nutrition Class  Patient was seen on 04/25/2012 for Pre-Operative Bariatric Surgery Education at [facility].  Surgery date: 05/07/12 Surgery type: LAGB Start weight at [facility]: 434.1 lbs  Weight today: 422.4 lbs Weight change: n/a Total weight lost: n/a BMI: 58.9 kg/m^2  Samples given per MNT protocol: Bariatric Advantage Multivitamin Lot # [id number] Exp: 12/13  Bariatric Advantage Calcium Citrate Lot # [id number] Exp: 12/13  Celebrate Vitamins Multivitamin Lot # [id number] Exp: 11/14  Celebrate Vitamins Calcium Citrate Lot # [id number] Exp: 10/14  Opurity Vitamins MVI (Band Optimized) Lot # [id number] Exp: 02/14  TwinLab Calcium Wafers Lot # [id number] Exp: 10/14  The following the learning objective met by the patient during this course:   Identifies Pre-Op Dietary Goals and will begin 2 weeks pre-operatively  Identifies appropriate sources of fluids and proteins   States protein recommendations and appropriate sources pre and post-operatively  Identifies Post-Operative Dietary Goals and will follow for 2 weeks post-operatively  Identifies appropriate multivitamin and calcium sources  Describes the need for physical activity post-operatively and will follow MD recommendations  States when to call healthcare provider regarding medication questions or post-operative complications  Handouts given during class include:  Pre-Op Bariatric Surgery Diet Handout  Protein Shake Handout  Post-Op Bariatric Surgery Nutrition Handout  BELT Program Information Flyer  Support Group Information Flyer  Follow-Up Plan: Patient will follow-up at [facility] 2 weeks post operatively for diet advancement per MD.

## 2012-04-25 NOTE — Patient Instructions (Signed)
Follow:   Pre-Op Diet per MD 2 weeks prior to surgery  Phase 2- Liquids (clear/full) 2 weeks after surgery  Vitamin/Mineral/Calcium guidelines for purchasing bariatric supplements  Exercise guidelines pre and post-op per MD  Follow-up at [facility] in 2 weeks post-op for diet advancement. Contact Otha Monical as needed with questions/concerns. 

## 2012-05-01 NOTE — Progress Notes (Signed)
DR Daphine Deutscher-   NEED PRE OP ORDERS PLEASE- HAS PST APPT 05/02/12 Thanks

## 2012-05-01 NOTE — Patient Instructions (Addendum)
22 Cameron Jones  05/01/2012   Your procedure is scheduled on:  05/07/12  Tuesday  Surgery [id number]  Report to [facility] at    0515   AM.  Call this number if you have problems the morning of surgery: [phone]     Or PST   [id number]  [name]   Remember: BRING YOUR CPAP MASK AND TUBING WITH YOU TO THE HOSPITAL  Do not eat food  Or drink any fluids :After Midnight. Monday NIGHT    Take these medicines the morning of surgery with A SIP OF WATER:  none   Do not wear jewelry, make-up or nail polish.  Do not wear lotions, powders, or perfumes. You may wear deodorant.  Do not shave 48 hours prior to surgery.  Do not bring valuables to the hospital.  Contacts, dentures or bridgework may not be worn into surgery.  Leave suitcase in the car. After surgery it may be brought to your room.  For patients admitted to the hospital, checkout time is 11:00 AM the day of discharge.   Patients discharged the day of surgery will not be allowed to drive home.  Name and phone number of your driver:  wife                                                                      Special Instructions: CHG Shower Use Special Wash: 1/2 bottle night before surgery and 1/2 bottle morning of surgery. REGULAR SOAP FACE AND PRIVATES                             MEN-MAY SHAVE FACE MORNING OF SURGERY  Please read over the following fact sheets that you were given: MRSA Information

## 2012-05-02 ENCOUNTER — Encounter (HOSPITAL_COMMUNITY): Payer: Self-pay

## 2012-05-02 ENCOUNTER — Encounter (HOSPITAL_COMMUNITY)
Admission: RE | Admit: 2012-05-02 | Discharge: 2012-05-02 | Disposition: A | Discharge status: Home / Self Care | Payer: BC Managed Care – PPO | Source: Ambulatory Visit | Attending: Surgery | Admitting: Surgery

## 2012-05-02 HISTORY — DX: Personal history of other medical treatment: Z92.89

## 2012-05-02 HISTORY — DX: Gastro-esophageal reflux disease without esophagitis: K21.9

## 2012-05-02 HISTORY — DX: Ventral hernia without obstruction or gangrene: K43.9

## 2012-05-02 HISTORY — DX: Carpal tunnel syndrome, right upper limb: G56.01

## 2012-05-02 HISTORY — DX: Fatty (change of) liver, not elsewhere classified: K76.0

## 2012-05-02 LAB — BASIC METABOLIC PANEL
Calcium: 9.5 mg/dL (ref 8.4–10.5)
GFR calc Af Amer: >90 mL/min (ref >90)
GFR calc non Af Amer: >90 mL/min (ref >90)
Glucose, Bld: 83 mg/dL (ref 70–99)
Potassium: 3.6 mEq/L (ref 3.5–5.1)
Sodium: 136 mEq/L (ref 135–145)

## 2012-05-02 LAB — SURGICAL PCR SCREEN
MRSA, PCR: NEGATIVE
Staphylococcus aureus: NEGATIVE

## 2012-05-02 LAB — CBC
Hemoglobin: 16 g/dL (ref 13.0–17.0)
MCH: 28.8 pg (ref 26.0–34.0)
MCHC: 33.7 g/dL (ref 30.0–36.0)
Platelets: 206 10*3/uL (ref 150–400)
RBC: 5.55 MIL/uL (ref 4.22–5.81)

## 2012-05-02 NOTE — Pre-Procedure Instructions (Signed)
CHEST X RAY, EKG 1/13 EPIC,   LOV Dr Mayford Knife 4/13 chart.    Requested last sleep study report from Dr Malachy Mood office. Notified Christina in portable of need for bariatric bed

## 2012-05-06 ENCOUNTER — Telehealth (INDEPENDENT_AMBULATORY_CARE_PROVIDER_SITE_OTHER): Payer: Self-pay

## 2012-05-06 NOTE — Telephone Encounter (Signed)
Calling b/c there are no orders in epic for the pt scheduled to have surgery tomorrow at 7:15am Lap Band. Please complete orders.

## 2012-05-06 NOTE — Pre-Procedure Instructions (Signed)
Spoke with Cameron Jones at [facility] and informed her of no pre op orders

## 2012-05-06 NOTE — Progress Notes (Signed)
NEED PRE OP ORDERS PLEASE        Surgery 05/07/12 thanks

## 2012-05-07 ENCOUNTER — Encounter (HOSPITAL_COMMUNITY): Payer: Self-pay | Admitting: *Deleted

## 2012-05-07 ENCOUNTER — Encounter (HOSPITAL_COMMUNITY)
Admission: RE | Disposition: A | Discharge status: Home Health | Payer: Self-pay | Source: Ambulatory Visit | Attending: Surgery

## 2012-05-07 ENCOUNTER — Other Ambulatory Visit (INDEPENDENT_AMBULATORY_CARE_PROVIDER_SITE_OTHER): Payer: Self-pay | Admitting: Surgery

## 2012-05-07 ENCOUNTER — Encounter (HOSPITAL_COMMUNITY): Payer: Self-pay | Admitting: Registered Nurse

## 2012-05-07 ENCOUNTER — Ambulatory Visit (HOSPITAL_COMMUNITY)
Admission: RE | Admit: 2012-05-07 | Discharge: 2012-05-08 | DRG: 160 | Disposition: A | Discharge status: Home Health | Payer: BC Managed Care – PPO | Source: Ambulatory Visit | Attending: Surgery | Admitting: Surgery

## 2012-05-07 ENCOUNTER — Ambulatory Visit (HOSPITAL_COMMUNITY): Payer: BC Managed Care – PPO | Admitting: Registered Nurse

## 2012-05-07 DIAGNOSIS — K432 Incisional hernia without obstruction or gangrene: Secondary | ICD-10-CM | POA: Diagnosis present

## 2012-05-07 DIAGNOSIS — Z01812 Encounter for preprocedural laboratory examination: Secondary | ICD-10-CM | POA: Insufficient documentation

## 2012-05-07 DIAGNOSIS — G4733 Obstructive sleep apnea (adult) (pediatric): Secondary | ICD-10-CM | POA: Diagnosis present

## 2012-05-07 DIAGNOSIS — Z6841 Body Mass Index (BMI) 40.0 and over, adult: Secondary | ICD-10-CM

## 2012-05-07 DIAGNOSIS — M129 Arthropathy, unspecified: Secondary | ICD-10-CM | POA: Diagnosis present

## 2012-05-07 DIAGNOSIS — Z9884 Bariatric surgery status: Secondary | ICD-10-CM

## 2012-05-07 DIAGNOSIS — K219 Gastro-esophageal reflux disease without esophagitis: Secondary | ICD-10-CM | POA: Insufficient documentation

## 2012-05-07 DIAGNOSIS — Z85038 Personal history of other malignant neoplasm of large intestine: Secondary | ICD-10-CM

## 2012-05-07 DIAGNOSIS — Z9049 Acquired absence of other specified parts of digestive tract: Secondary | ICD-10-CM

## 2012-05-07 DIAGNOSIS — Z7982 Long term (current) use of aspirin: Secondary | ICD-10-CM

## 2012-05-07 HISTORY — PX: LAPAROSCOPIC GASTRIC BANDING: SHX1100

## 2012-05-07 LAB — CBC
Hemoglobin: 14.8 g/dL (ref 13.0–17.0)
MCH: 28.5 pg (ref 26.0–34.0)
MCHC: 33.3 g/dL (ref 30.0–36.0)
MCV: 85.4 fL (ref 78.0–100.0)

## 2012-05-07 LAB — CREATININE, SERUM: Creatinine, Ser: 1.01 mg/dL (ref 0.50–1.35)

## 2012-05-07 SURGERY — GASTRIC BANDING, LAPAROSCOPIC
Anesthesia: General | Site: Abdomen | Wound class: Clean

## 2012-05-07 MED ORDER — ROCURONIUM BROMIDE 100 MG/10ML IV SOLN
INTRAVENOUS | Status: DC | PRN
  Administered 2012-05-07: 50 mg via INTRAVENOUS
  Administered 2012-05-07: 5 mg via INTRAVENOUS
  Administered 2012-05-07: 20 mg via INTRAVENOUS

## 2012-05-07 MED ORDER — OXYCODONE-ACETAMINOPHEN 5-325 MG/5ML PO SOLN: 5.0000 mL | ORAL | Status: DC | PRN

## 2012-05-07 MED ORDER — SUCCINYLCHOLINE CHLORIDE 20 MG/ML IJ SOLN
INTRAMUSCULAR | Status: DC | PRN
  Administered 2012-05-07: 140 mg via INTRAVENOUS

## 2012-05-07 MED ORDER — LACTATED RINGERS IV SOLN: INTRAVENOUS | Status: DC

## 2012-05-07 MED ORDER — OXYCODONE HCL 5 MG PO TABS: 5.0000 mg | ORAL_TABLET | Freq: Once | ORAL | Status: DC | PRN

## 2012-05-07 MED ORDER — UNJURY VANILLA POWDER: 2.0000 [oz_av] | Freq: Four times a day (QID) | ORAL | Status: DC

## 2012-05-07 MED ORDER — GLYCOPYRROLATE 0.2 MG/ML IJ SOLN
INTRAMUSCULAR | Status: DC | PRN
  Administered 2012-05-07: .8 mg via INTRAVENOUS

## 2012-05-07 MED ORDER — CEFOXITIN SODIUM-DEXTROSE 1-4 GM-% IV SOLR (PREMIX)
INTRAVENOUS | Status: AC
  Filled 2012-05-07: qty 100

## 2012-05-07 MED ORDER — KCL IN DEXTROSE-NACL 20-5-0.45 MEQ/L-%-% IV SOLN
INTRAVENOUS | Status: AC
  Filled 2012-05-07: qty 1000

## 2012-05-07 MED ORDER — OXYCODONE HCL 5 MG/5ML PO SOLN
5.0000 mg | Freq: Once | ORAL | Status: DC | PRN
  Filled 2012-05-07: qty 5

## 2012-05-07 MED ORDER — MORPHINE SULFATE 2 MG/ML IJ SOLN: 2.0000 mg | INTRAMUSCULAR | Status: DC | PRN

## 2012-05-07 MED ORDER — HYDROMORPHONE HCL PF 1 MG/ML IJ SOLN
INTRAMUSCULAR | Status: AC
  Filled 2012-05-07: qty 1

## 2012-05-07 MED ORDER — LIDOCAINE HCL (CARDIAC) 20 MG/ML IV SOLN
INTRAVENOUS | Status: DC | PRN
  Administered 2012-05-07: 100 mg via INTRAVENOUS

## 2012-05-07 MED ORDER — NEOSTIGMINE METHYLSULFATE 1 MG/ML IJ SOLN
INTRAMUSCULAR | Status: DC | PRN
  Administered 2012-05-07: 5 mg via INTRAVENOUS

## 2012-05-07 MED ORDER — DEXTROSE 5 % IV SOLN
2.0000 g | INTRAVENOUS | Status: AC
  Administered 2012-05-07: 2 g via INTRAVENOUS
  Filled 2012-05-07: qty 2

## 2012-05-07 MED ORDER — PROPOFOL 10 MG/ML IV EMUL
INTRAVENOUS | Status: DC | PRN
  Administered 2012-05-07: 300 mg via INTRAVENOUS

## 2012-05-07 MED ORDER — HEPARIN SODIUM (PORCINE) 5000 UNIT/ML IJ SOLN
5000.0000 [IU] | Freq: Three times a day (TID) | INTRAMUSCULAR | Status: DC
  Administered 2012-05-07 – 2012-05-08 (×3): 5000 [IU] via SUBCUTANEOUS
  Filled 2012-05-07 (×6): qty 1

## 2012-05-07 MED ORDER — 0.9 % SODIUM CHLORIDE (POUR BTL) OPTIME
TOPICAL | Status: DC | PRN
  Administered 2012-05-07: 1000 mL

## 2012-05-07 MED ORDER — UNJURY CHOCOLATE CLASSIC POWDER: 2.0000 [oz_av] | Freq: Four times a day (QID) | ORAL | Status: DC

## 2012-05-07 MED ORDER — ACETAMINOPHEN 10 MG/ML IV SOLN: 1000.0000 mg | Freq: Once | INTRAVENOUS | Status: DC | PRN

## 2012-05-07 MED ORDER — HEPARIN SODIUM (PORCINE) 5000 UNIT/ML IJ SOLN
5000.0000 [IU] | INTRAMUSCULAR | Status: AC
  Administered 2012-05-07: 5000 [IU] via SUBCUTANEOUS
  Filled 2012-05-07: qty 1

## 2012-05-07 MED ORDER — KCL IN DEXTROSE-NACL 20-5-0.45 MEQ/L-%-% IV SOLN
INTRAVENOUS | Status: DC
  Administered 2012-05-07 – 2012-05-08 (×3): via INTRAVENOUS
  Filled 2012-05-07 (×4): qty 1000

## 2012-05-07 MED ORDER — FENTANYL CITRATE 0.05 MG/ML IJ SOLN
INTRAMUSCULAR | Status: DC | PRN
  Administered 2012-05-07 (×5): 50 ug via INTRAVENOUS

## 2012-05-07 MED ORDER — ACETAMINOPHEN 160 MG/5ML PO SOLN
650.0000 mg | ORAL | Status: DC | PRN
Start: 2012-05-07 — End: 2012-05-08
  Administered 2012-05-08: 650 mg via ORAL
  Filled 2012-05-07: qty 20.3

## 2012-05-07 MED ORDER — HYDROMORPHONE HCL PF 1 MG/ML IJ SOLN
0.2500 mg | INTRAMUSCULAR | Status: DC | PRN
Start: 2012-05-07 — End: 2012-05-07
  Administered 2012-05-07 (×4): 0.5 mg via INTRAVENOUS

## 2012-05-07 MED ORDER — MIDAZOLAM HCL 5 MG/5ML IJ SOLN
INTRAMUSCULAR | Status: DC | PRN
  Administered 2012-05-07: 2 mg via INTRAVENOUS

## 2012-05-07 MED ORDER — DEXAMETHASONE SODIUM PHOSPHATE 10 MG/ML IJ SOLN
INTRAMUSCULAR | Status: DC | PRN
  Administered 2012-05-07: 10 mg via INTRAVENOUS

## 2012-05-07 MED ORDER — LACTATED RINGERS IV SOLN
INTRAVENOUS | Status: DC | PRN
  Administered 2012-05-07 (×2): via INTRAVENOUS

## 2012-05-07 MED ORDER — ONDANSETRON HCL 4 MG/2ML IJ SOLN
INTRAMUSCULAR | Status: DC | PRN
  Administered 2012-05-07: 4 mg via INTRAVENOUS

## 2012-05-07 MED ORDER — ONDANSETRON HCL 4 MG/2ML IJ SOLN: 4.0000 mg | INTRAMUSCULAR | Status: DC | PRN

## 2012-05-07 MED ORDER — UNJURY CHICKEN SOUP POWDER: 2.0000 [oz_av] | Freq: Four times a day (QID) | ORAL | Status: DC

## 2012-05-07 MED ORDER — SODIUM CHLORIDE 0.9 % IJ SOLN
INTRAMUSCULAR | Status: DC | PRN
  Administered 2012-05-07: 40 mL via INTRAVENOUS

## 2012-05-07 MED ORDER — PROMETHAZINE HCL 25 MG/ML IJ SOLN: 6.2500 mg | INTRAMUSCULAR | Status: DC | PRN

## 2012-05-07 MED ORDER — MEPERIDINE HCL 50 MG/ML IJ SOLN: 6.2500 mg | INTRAMUSCULAR | Status: DC | PRN

## 2012-05-07 MED ORDER — BUPIVACAINE LIPOSOME 1.3 % IJ SUSP
20.0000 mL | Freq: Once | INTRAMUSCULAR | Status: AC
  Administered 2012-05-07: 20 mL
  Filled 2012-05-07: qty 20

## 2012-05-07 MED ORDER — CHLORHEXIDINE GLUCONATE 0.12 % MT SOLN
15.0000 mL | Freq: Two times a day (BID) | OROMUCOSAL | Status: DC
  Administered 2012-05-07 – 2012-05-08 (×2): 15 mL via OROMUCOSAL
  Filled 2012-05-07 (×3): qty 15

## 2012-05-07 MED ORDER — BIOTENE DRY MOUTH MT LIQD
15.0000 mL | Freq: Two times a day (BID) | OROMUCOSAL | Status: DC
  Administered 2012-05-07 – 2012-05-08 (×2): 15 mL via OROMUCOSAL

## 2012-05-07 SURGICAL SUPPLY — 65 items
APL SKNCLS STERI-STRIP NONHPOA (GAUZE/BANDAGES/DRESSINGS)
BAND LAP VG SYSTEM W/TUBES (Band) IMPLANT
BENZOIN TINCTURE PRP APPL 2/3 (GAUZE/BANDAGES/DRESSINGS) IMPLANT
BLADE HEX COATED 2.75 (ELECTRODE) ×2 IMPLANT
BLADE SURG 15 STRL LF DISP TIS (BLADE) ×1 IMPLANT
BLADE SURG 15 STRL SS (BLADE) ×2
CANISTER SUCTION 2500CC (MISCELLANEOUS) ×2 IMPLANT
CLOTH BEACON ORANGE TIMEOUT ST (SAFETY) ×2 IMPLANT
COVER SURGICAL LIGHT HANDLE (MISCELLANEOUS) ×2 IMPLANT
DECANTER SPIKE VIAL GLASS SM (MISCELLANEOUS) ×4 IMPLANT
DEVICE SUT QUICK LOAD TK 5 (STAPLE) ×6 IMPLANT
DEVICE SUT TI-KNOT TK 5X26 (MISCELLANEOUS) ×2 IMPLANT
DEVICE SUTURE ENDOST 10MM (ENDOMECHANICALS) IMPLANT
DISSECTOR BLUNT TIP ENDO 5MM (MISCELLANEOUS) IMPLANT
DRAPE CAMERA CLOSED 9X96 (DRAPES) ×2 IMPLANT
ELECT REM PT RETURN 9FT ADLT (ELECTROSURGICAL) ×2
ELECTRODE REM PT RTRN 9FT ADLT (ELECTROSURGICAL) ×1 IMPLANT
GLOVE BIOGEL M 8.0 STRL (GLOVE) ×2 IMPLANT
GLOVE BIOGEL PI IND STRL 7.0 (GLOVE) ×1 IMPLANT
GLOVE BIOGEL PI INDICATOR 7.0 (GLOVE) ×1
GOWN STRL NON-REIN LRG LVL3 (GOWN DISPOSABLE) ×2 IMPLANT
GOWN STRL REIN XL XLG (GOWN DISPOSABLE) ×4 IMPLANT
HOVERMATT SINGLE USE (MISCELLANEOUS) ×2 IMPLANT
KIT BASIN OR (CUSTOM PROCEDURE TRAY) ×2 IMPLANT
LAP BAND VG SYSTEM W/TUBES (Band) ×2 IMPLANT
MESH HERNIA 1X4 RECT BARD (Mesh General) IMPLANT
MESH HERNIA BARD 1X4 (Mesh General) ×1 IMPLANT
NDL SPNL 22GX3.5 QUINCKE BK (NEEDLE) ×1 IMPLANT
NEEDLE SPNL 22GX3.5 QUINCKE BK (NEEDLE) ×2 IMPLANT
NS IRRIG 1000ML POUR BTL (IV SOLUTION) ×2 IMPLANT
PACK UNIVERSAL I (CUSTOM PROCEDURE TRAY) ×2 IMPLANT
PENCIL BUTTON HOLSTER BLD 10FT (ELECTRODE) ×2 IMPLANT
SCALPEL HARMONIC ACE (MISCELLANEOUS) IMPLANT
SCISSORS LAP 5X35 DISP (ENDOMECHANICALS) ×1 IMPLANT
SET IRRIG TUBING LAPAROSCOPIC (IRRIGATION / IRRIGATOR) IMPLANT
SHEARS CURVED HARMONIC AC 45CM (MISCELLANEOUS) ×1 IMPLANT
SLEEVE ADV FIXATION 5X100MM (TROCAR) ×1 IMPLANT
SLEEVE Z-THREAD 5X100MM (TROCAR) IMPLANT
SOLUTION ANTI FOG 6CC (MISCELLANEOUS) ×2 IMPLANT
SPONGE GAUZE 4X4 12PLY (GAUZE/BANDAGES/DRESSINGS) ×2 IMPLANT
SPONGE LAP 18X18 X RAY DECT (DISPOSABLE) ×2 IMPLANT
STAPLER VISISTAT 35W (STAPLE) ×2 IMPLANT
STRIP CLOSURE SKIN 1/2X4 (GAUZE/BANDAGES/DRESSINGS) IMPLANT
SUT ETHIBOND 2 0 SH (SUTURE) ×6
SUT ETHIBOND 2 0 SH 36X2 (SUTURE) ×3 IMPLANT
SUT PROLENE 2 0 CT2 30 (SUTURE) ×2 IMPLANT
SUT SILK 0 (SUTURE)
SUT SILK 0 30XBRD TIE 6 (SUTURE) IMPLANT
SUT SURGIDAC NAB ES-9 0 48 120 (SUTURE) IMPLANT
SUT VIC AB 2-0 SH 27 (SUTURE)
SUT VIC AB 2-0 SH 27X BRD (SUTURE) IMPLANT
SUT VIC AB 4-0 SH 18 (SUTURE) ×2 IMPLANT
SYR 20CC LL (SYRINGE) ×2 IMPLANT
SYR 30ML LL (SYRINGE) ×2 IMPLANT
SYS KII OPTICAL ACCESS 15MM (TROCAR) ×2
SYSTEM KII OPTICAL ACCESS 15MM (TROCAR) ×1 IMPLANT
TOWEL OR 17X26 10 PK STRL BLUE (TOWEL DISPOSABLE) ×4 IMPLANT
TROCAR ADV FIXATION 11X100MM (TROCAR) IMPLANT
TROCAR ADV FIXATION 5X100MM (TROCAR) ×1 IMPLANT
TROCAR XCEL NON-BLD 11X100MML (ENDOMECHANICALS) ×2 IMPLANT
TROCAR Z-THREAD FIOS 11X100 BL (TROCAR) IMPLANT
TROCAR Z-THREAD FIOS 5X100MM (TROCAR) ×2 IMPLANT
TROCAR Z-THREAD SLEEVE 11X100 (TROCAR) IMPLANT
TUBE CALIBRATION LAPBAND (TUBING) ×2 IMPLANT
TUBING INSUFFLATION 10FT LAP (TUBING) ×2 IMPLANT

## 2012-05-07 NOTE — Transfer of Care (Signed)
Immediate Anesthesia Transfer of Care Note  Patient: Cameron Jones  Procedure(s) Performed: Procedure(s) (LRB): LAPAROSCOPIC GASTRIC BANDING (N/A)  Patient Location: PACU  Anesthesia Type: General  Level of Consciousness: awake, alert , oriented and patient cooperative  Airway & Oxygen Therapy: Patient Spontanous Breathing and Patient connected to face mask oxygen  Post-op Assessment: Report given to PACU RN, Post -op Vital signs reviewed and stable and Patient moving all extremities  Post vital signs: Reviewed and stable  Complications: No apparent anesthesia complications

## 2012-05-07 NOTE — Op Note (Signed)
05/07/2012  Surgeon: Wenda Low, MD, FACS Asst:  Ovidio Kin, MD, FACS  Procedure: Laparoscopic adjustable gastric banding with Allergan APL  Anes:  General  EBL:  Minimal  Description of Procedure  The patient was taken to OR # 1 and given general anesthesia.  After a prep with PCMX the patient was draped and a timeout performed.  Access to the abdomen was achieved with a 0 degree Optiview technique through the left upper quadrant.    Adhesions were extensive and related to his prior right hemicolectomy per Dr. Derrell Lolling.  A large ventral hernia with omentum was displaced laterally to the right.  1 hour dissection was done to create enough room to place ports to proceed.  Ports were placed to the the right of the midline including a 15 trocar in  the right upper quadrant placed obliquely.  The Satira Mccallum was used to retract the left lateral segment and the peritoneum was incised along the left crus.   The EJ junction as assessed for a hiatus hernia and no dimple was seen.  UGI reported no hiatal hernia.  A balloon test no performed.    The pars flaccida technique was utilized to insert the blunt "finger " dissector from right to left behind the stomach.  This created a target zone to pass the band passer.     The lapband APL  Had been previously flushed and was inserted through the 15 trocar.  It was placed in the tip of "the finger"  and pulled around behind the stomach.   The band was plicated with 3 sutures of surgidec secured with Ty Knots.  The tubing was brought out through the upper incision on the LEFT with the port situated toward the midline and connected to the port which had mesh sewn onto the back and was placed into the subcutaneous pocket.  The incisions were injected with Exparel and closed with 4-0 vicryl and Dermabond.     The patient was taken to the PACU in stable condition.    Matt B. Daphine Deutscher, MD, [facility], [place] [phone]

## 2012-05-07 NOTE — H&P (Addendum)
Chief Complaint: Morbid obesity BMI 60  History of Present Illness: Cameron Jones is an 46 y.o. male who I have seen before with morbid obesity. He is a patient of Dr. Derrell Lolling. In October 2007 he underwent a right hemicolectomy. It was a T3 N0 colon cancer. Postoperatively he has developed a ventral incisional hernia. He has been to see [place] before regarding possible weight loss surgery and a study this well and wants a lap band. I think that this would be a good choice particularly with his prior abdominal surgery and bowel surgery.  He has undergone preop evaluation and is ready for Lapband placement.   Past Medical History   Diagnosis  Date   .  Cancer      colon   .  Sleep apnea    .  Lower back pain    .  Arthritis     Past Surgical History   Procedure  Date   .  Colon surgery  06/2006     colectomy    Current Outpatient Prescriptions   Medication  Sig  Dispense  Refill   .  aspirin 325 MG tablet  Take 325 mg by mouth every 4 (four) hours as needed.     Marland Kitchen  ibuprofen (ADVIL,MOTRIN) 200 MG tablet  Take 200 mg by mouth every 6 (six) hours as needed.     Review of patient's allergies indicates no known allergies.  Family History   Problem  Relation  Age of Onset   .  Cancer  Mother       pancreatic    .  Cancer  Brother       colon    .  Cancer  Maternal Aunt       breast   Social History: reports that he has never smoked. He does not have any smokeless tobacco history on file. He reports that he does not drink alcohol or use illicit drugs.  REVIEW OF SYSTEMS - PERTINENT POSITIVES ONLY:  noncontributory  Physical Exam:  Blood pressure 166/98, pulse 92, temperature 96.8 F (36 C), temperature source Temporal, resp. rate 18, height 5\' 11"  (1.803 m), weight 435 lb (197.315 kg).  Body mass index is 60.67 kg/(m^2).  Gen: WDWN African American male NAD  Neurological: Alert and oriented to person, place, and time. Motor and sensory function is grossly intact  Head:  Normocephalic and atraumatic.  Eyes: Conjunctivae are normal. Pupils are equal, round, and reactive to light. No scleral icterus.  Neck: Normal range of motion. Neck supple. No tracheal deviation or thyromegaly present.  Cardiovascular: SR without murmurs or gallops. No carotid bruits  Respiratory: Effort normal. No respiratory distress. No chest wall tenderness. Breath sounds normal. No wheezes, rales or rhonchi.  Abdomen: A broad-based ventral incisional hernia for which he wears a support.  GU:  Musculoskeletal: Normal range of motion. Extremities are nontender. No cyanosis, edema or clubbing noted Lymphadenopathy: No cervical, preauricular, postauricular or axillary adenopathy is present Skin: Skin is warm and dry. No rash noted. No diaphoresis. No erythema. No pallor. Pscyh: Normal mood and affect. Behavior is normal. Judgment and thought content normal.  LABORATORY RESULTS:  No results found for this or any previous visit (from the past 48 hour(s)).  RADIOLOGY RESULTS:  No results found.  Problem List:  Patient Active Problem List   Diagnoses   .  Colon cancer-right T3 N0, Oct 2007   .  Ventral hernia-midline   .  Obstructive sleep apnea of adult   .  Obesity BMI 61   Assessment & Plan:  Morbid obesity BMI 60. Plan lapband placement.   Matt B. Daphine Deutscher, MD, [facility]  [phone] beeper  [phone]  UGI ok.  Has ventral herniae noted on CT scan.  Have discussed repair possibly after weight loss.   Ready for lapband.  There has been no change in the patient's past medical history or physical exam in the past 24 hours to the best of my knowledge. I examined the patient in the holding area and have made any changes to the history and physical exam report that is included above.   Expectations and outcome results have been discussed with the patient to include risks and benefits.  All questions have been answered and we will proceed with previously  discussed procedure noted and signed in the consent form in the patient's record.    Terel Bann BMD FACS 7:17 AM  05/07/2012

## 2012-05-07 NOTE — Preoperative (Signed)
Beta Blockers   Reason not to administer Beta Blockers:Not Applicable 

## 2012-05-07 NOTE — Anesthesia Preprocedure Evaluation (Addendum)
Anesthesia Evaluation  Patient identified by MRN, date of birth, ID band Patient awake    Reviewed: Allergy & Precautions, H&P , NPO status , Patient's Chart, lab work & pertinent test results  History of Anesthesia Complications Negative for: history of anesthetic complications  Airway Mallampati: I TM Distance: >3 FB Neck ROM: Full    Dental  (+) Teeth Intact and Dental Advisory Given   Pulmonary sleep apnea and Continuous Positive Airway Pressure Ventilation ,  breath sounds clear to auscultation  Pulmonary exam normal       Cardiovascular Exercise Tolerance: Good negative cardio ROS  Rhythm:Regular Rate:Normal     Neuro/Psych    GI/Hepatic Neg liver ROS, GERD-  Controlled,  Endo/Other  negative endocrine ROS  Renal/GU negative Renal ROS     Musculoskeletal   Abdominal (+) + obese,   Peds  Hematology negative hematology ROS (+)   Anesthesia Other Findings   Reproductive/Obstetrics                          Anesthesia Physical Anesthesia Plan  ASA: III  Anesthesia Plan: General   Post-op Pain Management:    Induction: Intravenous  Airway Management Planned: Oral ETT  Additional Equipment:   Intra-op Plan:   Post-operative Plan: Extubation in OR  Informed Consent: I have reviewed the patients History and Physical, chart, labs and discussed the procedure including the risks, benefits and alternatives for the proposed anesthesia with the patient or authorized representative who has indicated his/her understanding and acceptance.   Dental advisory given  Plan Discussed with: CRNA and Surgeon  Anesthesia Plan Comments:         Anesthesia Quick Evaluation

## 2012-05-07 NOTE — Anesthesia Postprocedure Evaluation (Signed)
Anesthesia Post Note  Patient: Cameron Jones  Procedure(s) Performed: Procedure(s) (LRB): LAPAROSCOPIC GASTRIC BANDING (N/A)  Anesthesia type: General  Patient location: PACU  Post pain: Pain level controlled  Post assessment: Post-op Vital signs reviewed  Last Vitals: BP 142/84  Pulse 67  Temp 36.4 C (Oral)  Resp 18  Wt 406 lb 2 oz (184.217 kg)  SpO2 99%  Post vital signs: Reviewed  Level of consciousness: sedated  Complications: No apparent anesthesia complications

## 2012-05-07 NOTE — Progress Notes (Signed)
Pt started on cpap per MD. Pt wearing own nasal mask.pt settings are 10 cmH2O per home settings. Pt encouraged to call for assistance if needed.

## 2012-05-08 ENCOUNTER — Inpatient Hospital Stay (HOSPITAL_COMMUNITY): Payer: BC Managed Care – PPO

## 2012-05-08 ENCOUNTER — Encounter (HOSPITAL_COMMUNITY): Payer: Self-pay | Admitting: Surgery

## 2012-05-08 LAB — DIFFERENTIAL
Eosinophils Absolute: 0 10*3/uL (ref 0.0–0.7)
Eosinophils Relative: 0 % (ref 0–5)
Lymphocytes Relative: 16 % (ref 12–46)
Lymphs Abs: 1.3 10*3/uL (ref 0.7–4.0)
Monocytes Absolute: 0.9 10*3/uL (ref 0.1–1.0)
Monocytes Relative: 10 % (ref 3–12)

## 2012-05-08 LAB — CBC
HCT: 41.5 % (ref 39.0–52.0)
Hemoglobin: 14.1 g/dL (ref 13.0–17.0)
MCH: 28.8 pg (ref 26.0–34.0)
MCV: 84.7 fL (ref 78.0–100.0)
Platelets: 201 10*3/uL (ref 150–400)
RBC: 4.9 MIL/uL (ref 4.22–5.81)
WBC: 8.3 10*3/uL (ref 4.0–10.5)

## 2012-05-08 MED ORDER — OXYCODONE-ACETAMINOPHEN 5-325 MG/5ML PO SOLN: 5.0000 mL | ORAL | Status: DC | PRN

## 2012-05-08 NOTE — Care Management Note (Signed)
    Page 1 of 1   05/08/2012     11:52:46 AM   CARE MANAGEMENT NOTE 05/08/2012  Patient:  Cameron Jones, Cameron Jones   Account Number:  [id number]  Date Initiated:  05/08/2012  Documentation initiated by:  Lorenda Ishihara  Subjective/Objective Assessment:   46 yo male admitted s/p lap band, large ventral hernia. PTA lived at home with spouse.     Action/Plan:   Anticipated DC Date:  05/08/2012   Anticipated DC Plan:  HOME/SELF CARE      DC Planning Services  CM consult      Choice offered to / List presented to:             Status of service:  Completed, signed off Medicare Important Message given?   (If response is "NO", the following Medicare IM given date fields will be blank) Date Medicare IM given:   Date Additional Medicare IM given:    Discharge Disposition:  HOME/SELF CARE  Per UR Regulation:  Reviewed for med. necessity/level of care/duration of stay  If discussed at Long Length of Stay Meetings, dates discussed:    Comments:

## 2012-05-08 NOTE — Discharge Summary (Signed)
Physician Discharge Summary  Patient ID: Cameron Jones MRN: [id number] DOB/AGE: [birth year] 46 y.o.  Admit date: 05/07/2012 Discharge date: 05/08/2012  Admission Diagnoses:  Obesity and history of colon cancer with large ventral hernia  Discharge Diagnoses:  same  Active Problems:  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   Surgery:  lapband APL  Discharged Condition: improved  Hospital Course:   Had surgery and was kept overnight before discharge.    Consults: none  Significant Diagnostic Studies: xray    Discharge Exam: Blood pressure 140/85, pulse 63, temperature 97.5 F (36.4 C), temperature source Oral, resp. rate 18, height 5\' 11"  (1.803 m), weight 406 lb 2 oz (184.217 kg), SpO2 98.00%. Minimal tenderness as expected.    Disposition: 06-Home-Health Care Svc  Discharge Orders    Future Appointments: Provider: Department: Dept Phone: Center:   05/15/2012 2:20 PM Valarie Merino, MD [facility] [phone] None   05/21/2012 4:00 PM [facility] [phone] [facility]     Future Orders Please Complete By Expires   Diet Carb Modified      Increase activity slowly      Discharge instructions      Comments:   See bariatric instructions     Medication List  As of 05/08/2012  8:33 AM   TAKE these medications         aspirin EC 325 MG tablet   Take 325 mg by mouth as needed. For pain      oxyCODONE-acetaminophen 5-325 MG/5ML solution   Commonly known as: ROXICET   Take 5-10 mLs by mouth every 4 (four) hours as needed.           Follow-up Information    Follow up with Valarie Merino, MD.   Contact information:   [address] [place] [address] [phone]          Signed: Valarie Merino 05/08/2012, 8:33 AM

## 2012-05-08 NOTE — Progress Notes (Signed)
Patient alert and oriented. Vital signs stable. Pain controlled. Ambulating well. Plan to discharge home after evidence that he tolerates protein drink. Reviewed bariatric adjustable gastric band discharge instructions and patient verbalized understanding; copy provided to patient.  ADJUSTABLE GASTRIC BAND DISCHARGE INSTRUCTIONS  Drs. Fredrik Rigger, [place], Mariesa Grieder, and [facility]  Call if you have any problems.   Call [phone] and ask for the surgeon on call.    If you need immediate assistance come to the ER at [facility]. Tell the ER personnel that you are a new post-op gastric banding patient. Signs and symptoms to report:   Severe vomiting or nausea. If you cannot tolerate clear liquids for longer than 1 day, you need to call your surgeon.    Abdominal pain which does not get better after taking your pain medication   Fever greater than 101 F degree   Difficulty breathing   Chest pain    Redness, swelling, drainage, or foul odor at incision sites    If your incisions open or pull apart   Swelling or pain in calf (lower leg)   Diarrhea, frequent watery, uncontrolled bowel movements.   Constipation, (no bowel movements for 3 days) if this occurs, Take Milk of Magnesia, 2 tablespoons by mouth, 3 times a day for 2 days if needed.  Call your doctor if constipation continues. Stop taking Milk of Magnesia once you have had a bowel movement. You may also use Miralax according to the label instructions.   Anything you consider "abnormal for you".   Normal side effects after Surgery:   Unable to sleep at night or concentrate   Irritability   Being tearful (crying) or depressed   These are common complaints, possibly related to your anesthesia, stress of surgery and change in lifestyle, that usually go away a few weeks after surgery.  If these feelings continue, call your medical doctor.  Wound Care You may have surgical glue, steri-strips, or staples over your incisions after surgery.    Surgical glue:  Looks like a clear film over your incisions and will wear off gradually. Steri-strips: Strips of tape over your incisions. You may notice a yellowish color on the skin underneath the steri-strips. This is a substance used to make the steri-strips stick better. Do not pull the steri-strips off - let them fall off. Staples: Cherlynn Polo may be removed before you leave the hospital. If you go home with staples, call [facility] [phone]) for an appointment with your surgeon's nurse to have staples removed in 7 - 10 days. Showering: You may shower two days after your surgery unless otherwise instructed by your surgeon. Wash gently around wounds with warm soapy water, rinse well, and gently pat dry.  If you have a drain, you may need someone to hold this while you shower. Avoid tub baths until staples are removed and incisions are healed.    Medications   Medications should be liquid or crushed if larger than the size of a dime.  Extended release pills should not be crushed.   Depending on the size and number of medications you take, you may need to stagger/change the time you take your medications so that you do not over-fill your pouch.    Make sure you follow-up with your primary care physician to make medication adjustments needed during rapid weight loss and life-style adjustment.   If you are diabetic, follow up with the doctor that prescribes your diabetes medication(s) within one week after surgery and check your blood  sugar regularly.   Do not drive while taking narcotics!   Do not take acetaminophen (Tylenol) and Roxicet or Lortab Elixir at the same time since these pain medications contain acetaminophen.  Diet at home: (First 2 Weeks)  You will see the nutritionist two weeks after your surgery. She will advance your diet if you are tolerating liquids well. Once at home, if you have severe vomiting or nausea and cannot tolerate clear liquids lasting longer than 1  day, call your surgeon.  For Same Day Surgery Discharge Patients: The day of surgery drink water only: 2 ounces every 4 hours. If you are tolerating water, begin drinking your high protein shake the next morning. For Overnight Stay Patients: Begin high protein shake 2 ounces every 3 hours, 5 - 6 times per day.  Gradually increase the amount you drink as tolerated.  You may find it easier to slowly sip shakes throughout the day.  It is important to get your proteins in first.   Protein Shake   Drink at least 2 ounces of shake 5-6 times per day   Each serving of protein shakes should have a minimum of 15 grams of protein and no more than 5 grams of carbohydrate    Increase the amount of protein shake you drink as tolerated   Protein powder may be added to fluids such as non-fat milk or Lactaid milk (limit to 20 grams added protein powder per serving   The initial goal is to drink at least 8 ounces of protein shake/drink per day (or as directed by the nutritionist). Some examples of protein shakes are [employer], [employer], EAS Edge HP, and [employer]jury. Hydration   Gradually increase the amount of water and other liquids as tolerated (See Acceptable Fluids)   Gradually increase the amount of protein shake as tolerated     Sip fluids slowly and throughout the day   May use Sugar substitutes, use sparingly (limit to 6 - 8 packets per day).  Your fluid goal is 64 ounces of fluid daily. It may take a few weeks to build up to this.         32 oz (or more) should be clear liquids and 32 oz (or more) should be full liquids.         Liquids should not contain sugar, caffeine, or carbonation!  Acceptable Fluids Clear Liquids:   Water or Sugar-free flavored water, Fruit H2O   Decaffeinated coffee or tea (sugar-free)   Crystal Lite, Wyler's Lite, Minute Maid Lite   Sugar-free Jell-O   Bouillon or broth   Sugar-free Popsicle:   *Less than 20 calories each; Limit 1 per day   Full Liquids:               Protein Shakes/Drinks + 2 choices per day of other full liquids shown below.    Other full liquids must be: No more than 12 grams of Carbs per serving,  No more than 3 grams of Fat per serving   Strained low-fat cream soup   Non-Fat milk   Fat-free Lactaid Milk   Sugar-free yogurt (Dannon Lite & Fit) Vitamins and Minerals (Start 1 day after surgery unless otherwise directed)   1 Chewable Multivitamin / Multimineral Supplement (i.e. Centrum for Adults)   Chewable Calcium Citrate with Vitamin D-3. Take 1500 mg each day.           (Example: 3 Chewable Calcium Plus 600 with Vitamin D-3 can be found at [facility])  Do not mix multivitamins containing iron with calcium supplements; take 2 hours   apart   Do not substitute Tums (calcium carbonate) for your calcium   Menstruating women and those at risk for anemia may need extra iron. Talk with your doctor to see if you need additional iron.     If you need extra iron:  Total daily Iron recommendations (including Vitamins) = 50 - 100 mg Iron/day Do not stop taking or change any vitamins or minerals until you talk to your nutritionist or surgeon. Your nutritionist and / or physician must approve all vitamin and mineral supplements. Exercise For maximum success, begin exercising as soon as your doctor recommends. Make sure your physician approves any physical activity.   Depending on fitness level, begin with a simple walking program   Walk 5-15 minutes each day, 7 days per week.    Slowly increase until you are walking 30-45 minutes per day   Consider joining our BELT program. [phone] or email belt@[employer] .edu Things to remember:   You may have sexual relations when you feel comfortable. It is VERY important for male patients to use a reliable birth control method. Fertility often increases after surgery. Do not get pregnant for at least 18 months.   It is very important to keep all follow up appointments with your surgeon, nutritionist,  primary care physician, and behavioral health practitioner. After the first year, please follow up with your bariatric surgeon at least once a year in order to maintain best weight loss results.  [facility] [place] [facility]: [phone] [facility]: [phone]   Free counseling is available for you and your family through collaboration between [facility] and [place]. Please call [phone] and leave a message.    Consider purchasing a medical alert bracelet that says you had lap-band surgery.    The [facility] has a free Bariatric Surgery Support Group that meets monthly, the 3rd Thursday, 6 pm, Classroom #1, [place]. You may register online at [email or web address], but registration is not necessary. Select Classes and Support Groups, Bariatric Surgery, or Call [phone]   Do not return to work or drive until cleared by your surgeon   Use your CPAP when sleeping if applicable   Do not lift anything greater than ten pounds for at least two weeks.   You will probably have your first fill (fluid added to your band) 6 weeks after surgery

## 2012-05-10 ENCOUNTER — Telehealth (INDEPENDENT_AMBULATORY_CARE_PROVIDER_SITE_OTHER): Payer: Self-pay | Admitting: General Surgery

## 2012-05-10 NOTE — Telephone Encounter (Signed)
Pt called this afternoon and stating that he felt like a bubble  in his chest some pressure when he swallow , water and the shanks that he is taking are going down ok, vit and other meds are burning his mouth, he stated that his mouth is white. Pt just had lab band surgery back on 05-07-12. Went to Dr Ezzard Standing with all of the pt problems. He stated that pt just need to stay on liquids over the weekend and if the problems are still going to call back on Monday and speak to the nurse on phones. Also pt is not running any fevers

## 2012-05-15 ENCOUNTER — Ambulatory Visit (INDEPENDENT_AMBULATORY_CARE_PROVIDER_SITE_OTHER): Payer: BC Managed Care – PPO | Admitting: Surgery

## 2012-05-15 ENCOUNTER — Encounter (INDEPENDENT_AMBULATORY_CARE_PROVIDER_SITE_OTHER): Payer: Self-pay | Admitting: Surgery

## 2012-05-15 VITALS — BP 136/80 | HR 74 | Temp 97.6°F | Resp 16 | Ht 71.0 in | Wt >= 6400 oz

## 2012-05-15 DIAGNOSIS — Z9884 Bariatric surgery status: Secondary | ICD-10-CM

## 2012-05-15 NOTE — Patient Instructions (Signed)
Avoid carbs May increase exercise

## 2012-05-15 NOTE — Progress Notes (Signed)
Cameron Jones 46 y.o.  Body mass index is 56.10 kg/(m^2).  Patient Active Problem List  Diagnosis  . Colon cancer-right T3 N0, Oct 2007  . Ventral hernia-midline  . Obstructive sleep apnea of adult  . Obesity BMI 61  . 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 Colon surgery 06/2006    colectomy  . Breath tek h pylori 11/07/2011    Procedure: BREATH TEK H PYLORI;  Surgeon: Kandis Cocking, MD;  Location: [facility];  Service: General;  Laterality: N/A;  . Breath tek h pylori 01/02/2012    Procedure: BREATH TEK H PYLORI;  Surgeon: Valarie Merino, MD;  Location: [facility];  Service: General;  Laterality: N/A;  . Laparoscopic gastric banding 05/07/2012    Procedure: LAPAROSCOPIC GASTRIC BANDING;  Surgeon: Valarie Merino, MD;  Location: [facility];  Service: General;  Laterality: N/A;   FULP, CAMMIE, MD No diagnosis found.  Has some dysphagia related to swelling.  He is feeling some food cravings.  Incisions OK.  Doing well.  Return: Sept 24  Matt B. Daphine Deutscher, MD, [facility] [phone] beeper [phone]  05/15/2012 2:51 PM

## 2012-05-21 ENCOUNTER — Encounter: Payer: BC Managed Care – PPO | Admitting: *Deleted

## 2012-05-21 VITALS — Ht 71.0 in | Wt 397.2 lb

## 2012-05-21 DIAGNOSIS — E669 Obesity, unspecified: Secondary | ICD-10-CM

## 2012-05-21 NOTE — Patient Instructions (Addendum)
Patient to follow Phase 3A-Soft, High Protein Diet and follow-up at [facility] in 6 weeks for 2 months post-op nutrition visit for diet advancement. 

## 2012-05-21 NOTE — Progress Notes (Signed)
  Bariatric Class:  Appt start time: 1600 end time:  1700.  2 Week Post-Operative Nutrition Class  Patient was seen on 05/21/2012 for Post-Operative Nutrition [facility].   Surgery date: 05/07/12 Surgery type: LAGB Start weight at [facility]: 422.4 lbs  Weight today: 397.2 lbs Weight change: 25.2 lbs Total weight lost: 25.2 lbs BMI: 55.4 kg/m^2  The following the learning objective met the patient during this course:   Identifies Phase 3A (Soft, High Proteins) Dietary Goals and will begin from 2 weeks post-operatively to 2 months post-operatively  Identifies appropriate sources of fluids and proteins   States protein recommendations and appropriate sources post-operatively  Identifies the need for appropriate texture modifications, mastication, and bite sizes when consuming solids  Identifies appropriate multivitamin and calcium sources post-operatively  Describes the need for physical activity post-operatively and will follow MD recommendations  States when to call healthcare provider regarding medication questions or post-operative complications  Handouts given during class include:  Phase 3A: Soft, High Protein Diet Handout  Band Fill Guidelines Handout  Follow-Up Plan: Patient will follow-up at [facility] in 6 weeks for 2 months post-op nutrition visit for diet advancement per MD.

## 2012-05-22 ENCOUNTER — Encounter: Payer: Self-pay | Admitting: *Deleted

## 2012-06-11 ENCOUNTER — Telehealth (INDEPENDENT_AMBULATORY_CARE_PROVIDER_SITE_OTHER): Payer: Self-pay | Admitting: General Surgery

## 2012-06-11 NOTE — Telephone Encounter (Signed)
Spoke with pt and informed him that I could move his appt up from 10/2 to 9/19.  He said he would like to do this, so it has been changed.

## 2012-06-13 ENCOUNTER — Encounter (INDEPENDENT_AMBULATORY_CARE_PROVIDER_SITE_OTHER): Payer: Self-pay | Admitting: Surgery

## 2012-06-13 ENCOUNTER — Ambulatory Visit (INDEPENDENT_AMBULATORY_CARE_PROVIDER_SITE_OTHER): Payer: BC Managed Care – PPO | Admitting: Surgery

## 2012-06-13 VITALS — BP 138/86 | HR 60 | Temp 97.8°F | Resp 18 | Ht 71.0 in | Wt 378.4 lb

## 2012-06-13 DIAGNOSIS — Z9884 Bariatric surgery status: Secondary | ICD-10-CM

## 2012-06-13 NOTE — Patient Instructions (Signed)

## 2012-06-13 NOTE — Progress Notes (Signed)
Cameron Jones Body mass index is 52.78 kg/(m^2).  Having regurgitation:  no  Nocturnal reflux?  no  Amount of fill  1.5  Port is angulated and I had to reach this with multiple attempts.  Angulated downward and so the approach is from below at an angle.  Initially I had added 3 cc but that was too much.

## 2012-06-26 ENCOUNTER — Encounter (INDEPENDENT_AMBULATORY_CARE_PROVIDER_SITE_OTHER): Payer: BC Managed Care – PPO | Admitting: Surgery

## 2012-07-02 ENCOUNTER — Ambulatory Visit: Payer: BC Managed Care – PPO | Admitting: *Deleted

## 2012-07-03 ENCOUNTER — Encounter: Payer: Self-pay | Admitting: *Deleted

## 2012-07-03 ENCOUNTER — Encounter: Payer: BC Managed Care – PPO | Attending: Surgery | Admitting: *Deleted

## 2012-07-03 VITALS — Ht 71.0 in | Wt 367.5 lb

## 2012-07-03 DIAGNOSIS — Z713 Dietary counseling and surveillance: Secondary | ICD-10-CM | POA: Insufficient documentation

## 2012-07-03 DIAGNOSIS — Z01818 Encounter for other preprocedural examination: Secondary | ICD-10-CM | POA: Insufficient documentation

## 2012-07-03 DIAGNOSIS — E669 Obesity, unspecified: Secondary | ICD-10-CM

## 2012-07-03 NOTE — Progress Notes (Signed)
Follow-up visit:  8 Weeks Post-Operative LAGB Surgery  Medical Nutrition Therapy:  Appt start time: 1030 end time:  1100.  Primary concerns today: Post-operative Bariatric Surgery Nutrition Management. Cameron Jones returns today for 2 mo f/u with ~30 lb weight loss. Doing very well, though reports constipation; likely d/t taking calcium all in one dose.  States he had fill on 9/19, but could not tolerate water. At visit, Dr. Daphine Deutscher removed 1/2 of fill and pt now feels in yellow zone. No other problems reported.  Surgery date: 05/07/12 Surgery type: LAGB Start weight at [facility]: 422.4 lbs  Weight today: 367.5 lbs Weight change: 29.7 lbs Total weight lost: 54.9 lbs BMI: 51.3 kg/m^2  Weight goal: 275 lbs % Weight goal met: 37%  TANITA  BODY COMP RESULTS  07/03/12   %Fat 45.8%   Fat Mass (lbs) 168.5   Fat Free Mass (lbs) 199.0   Total Body Water (lbs) 145.5   24-hr recall: B (AM): 32 oz water + protein shake (made with milk) Snk (AM): jello, cheese stick L (12-12:30 PM): 3 oz meat, green beans Snk (PM):  None D (PM): 3 oz meat, green beans Snk (PM): greek yogurt, SF jello  Fluid intake:  > 64 oz Estimated total protein intake: 85-95 g  Medications: None. No longer taking ASA Supplementation: Taking daily, though taking calcium doses at same time. Discussed correct dosing schedule.  Using straws: No Drinking while eating: No Hair loss: No Carbonated beverages: No N/V/D/C: Constipation reported. May be from taking 1500 mg calcium in one dose.  Last Lap-Band fill:  06/13/12; 3 cc added, then 1.5 cc removed at same visit d/t pt not tolerating water. States he knows he needs another fill. Is in yellow zone  Recent physical activity:  Walking stairs, walking at work.   Progress Towards Goal(s):  In progress.  Handouts given during visit include:  Phase 3B: High Protein + Non-Starchy Vegetables   Nutritional Diagnosis:  [place]-3.3 Overweight/obesity As related to recent LAGB surgery.  As  evidenced by patient following post-op nutrition guidelines for continued weight loss.    Intervention:  Nutrition education.  Monitoring/Evaluation:  Dietary intake, exercise, lap band fills, and body weight. Follow up in 1 months for 3 month post-op visit.

## 2012-07-03 NOTE — Patient Instructions (Addendum)
Goals:  Follow Phase 3B: High Protein + Non-Starchy Vegetables  Eat 3-6 small meals/snacks, every 3-5 hrs  Increase lean protein foods to meet 80g goal  Increase fluid intake to 64oz +  Avoid drinking 15 minutes before, during and 30 minutes after eating  Aim for >30 min of physical activity daily 

## 2012-07-11 ENCOUNTER — Encounter (INDEPENDENT_AMBULATORY_CARE_PROVIDER_SITE_OTHER): Payer: Self-pay

## 2012-07-11 ENCOUNTER — Ambulatory Visit (INDEPENDENT_AMBULATORY_CARE_PROVIDER_SITE_OTHER): Payer: BC Managed Care – PPO | Admitting: Physician Assistant

## 2012-07-11 VITALS — BP 140/80 | HR 61 | Ht 71.0 in | Wt 368.4 lb

## 2012-07-11 DIAGNOSIS — Z4651 Encounter for fitting and adjustment of gastric lap band: Secondary | ICD-10-CM

## 2012-07-11 NOTE — Patient Instructions (Signed)
Take clear liquids tonight. Thin protein shakes are ok to start tomorrow morning. Slowly advance your diet thereafter. Call us if you have persistent vomiting or regurgitation, night cough or reflux symptoms. Return as scheduled or sooner if you notice no changes in hunger/portion sizes.  

## 2012-07-11 NOTE — Progress Notes (Signed)
  HISTORY: Cameron Jones is a 46 y.o.male who received an AP-Large lap-band in August 2013 by Dr. Daphine Deutscher. He comes in with 10 lbs weight loss but has noticed an increase in hunger and portion sizes. He wants an adjustment today, but not a large one.  VITAL SIGNS: Filed Vitals:   07/11/12 1435  BP: 140/80  Pulse: 61    PHYSICAL EXAM: Physical exam reveals a very well-appearing 46 y.o.male in no apparent distress Neurologic: Awake, alert, oriented Psych: Bright affect, conversant Respiratory: Breathing even and unlabored. No stridor or wheezing Abdomen: Soft, nontender, nondistended to palpation. Incisions well-healed. No incisional hernias. Port easily palpated. Extremities: Atraumatic, good range of motion.  ASSESMENT: 46 y.o.  male  s/p AP-Large lap-band.   PLAN: The patient's port was accessed with a 20G Huber needle without difficulty. Clear fluid was aspirated and 0.75 mL saline was added to the port. The patient was able to swallow water without difficulty following the procedure and was instructed to take clear liquids for the next 24-48 hours and advance slowly as tolerated.

## 2012-07-26 ENCOUNTER — Encounter (INDEPENDENT_AMBULATORY_CARE_PROVIDER_SITE_OTHER): Payer: BC Managed Care – PPO | Admitting: Surgery

## 2012-08-07 ENCOUNTER — Encounter: Payer: Self-pay | Admitting: *Deleted

## 2012-08-07 ENCOUNTER — Encounter: Payer: BC Managed Care – PPO | Attending: Surgery | Admitting: *Deleted

## 2012-08-07 VITALS — Ht 71.0 in | Wt 346.5 lb

## 2012-08-07 DIAGNOSIS — E669 Obesity, unspecified: Secondary | ICD-10-CM

## 2012-08-07 DIAGNOSIS — Z713 Dietary counseling and surveillance: Secondary | ICD-10-CM | POA: Insufficient documentation

## 2012-08-07 DIAGNOSIS — Z01818 Encounter for other preprocedural examination: Secondary | ICD-10-CM | POA: Insufficient documentation

## 2012-08-07 NOTE — Progress Notes (Addendum)
Follow-up visit:  12 Weeks Post-Operative LAGB Surgery  Medical Nutrition Therapy:  Appt start time: 820  End time:  850.  Primary concerns today: Post-operative Bariatric Surgery Nutrition Management. Reports he thinks his band has slipped or is leaking. Could not tolerate many foods this past weekend while camping in 24 degree weather, though felt no restriction at all once home (even had no problems with a pc of fried chicken).  Not eating salad greens and many other veggies d/t fear he will overeat them as in the past.  Very concerned about his band. Advised to contact Mardelle Matte or MD to discuss.  Surgery date: 05/07/12 Surgery type: LAGB Start weight at [facility]: 422.4 lbs  Weight today: 346.5 lbs Weight change: 21 lbs Total weight lost: 75.9 lbs BMI: 48.3 kg/m^2  Weight goal: 275 lbs (first goal); 230-245 lbs (end goal) % Weight goal met: 1st goal - 51%; End goal - 39-43%  TANITA  BODY COMP RESULTS  07/03/12 08/07/12   %Fat 45.8% 36.4%   Fat Mass (lbs) 168.5 126.0   Fat Free Mass (lbs) 199.0 220.5   Total Body Water (lbs) 145.5 161.5   24-hr recall: B (7:30-8 AM): 32 oz water + 8 oz protein shake (made with milk) Snk (10:30-11 AM): cheese stick L (12-12:30 PM): 1 cucumber (no rind), 2 cheese sticks, 1 oz peanuts Snk (PM):  None D (PM): 2 eggs, 2 pcs regular sausage Snk (PM): greek yogurt w/ 1/4 c walnuts  Fluid intake:  > 64 oz Estimated total protein intake: 85-95 g  Medications: Dulcolax Supplementation: Taking daily as prescribed. Added liver thistle, an all-natural probiotic, and laxative called "the cleanse" - all purchased at [employer]  Using straws: No Drinking while eating: No Hair loss: No Carbonated beverages: No N/V/D/C: Painful constipation reported. Taking dulcolax prn. No intake of greens/other fiber-containing foods noted (see above). Discussed adding fiber supplement.  Last Lap-Band fill:  07/11/12; + 0.75 cc (see above regarding restriction)  Recent  physical activity:  Walking stairs, walking at work; 1.5 mile hike while camping last weekend; painting daughter's room  Progress Towards Goal(s):  In progress.   Nutritional Diagnosis:  [place]-3.3 Overweight/obesity As related to recent LAGB surgery.  As evidenced by patient following post-op nutrition guidelines for continued weight loss.    Intervention:  Nutrition education.  Monitoring/Evaluation:  Dietary intake, exercise, lap band fills, and body weight. Follow up in 3 months for 6 month post-op visit or sooner if needed.

## 2012-08-07 NOTE — Patient Instructions (Addendum)
Goals:  Follow Phase 3B: High Protein + Non-Starchy Vegetables  Increase lean protein foods to meet 80-100g goal  Continue fluid intake of 64oz +  Add 15 grams of carbohydrate (fruit, whole grain, starchy vegetable) with meals  Aim for >30 min of physical activity daily  Add fiber supplement (in the form of psyllium; Metamucil, etc) - aim for 25-35 g daily.

## 2012-08-08 ENCOUNTER — Telehealth (INDEPENDENT_AMBULATORY_CARE_PROVIDER_SITE_OTHER): Payer: Self-pay | Admitting: General Surgery

## 2012-08-08 NOTE — Telephone Encounter (Signed)
Called to check on patient and schd appt per Mardelle Matte for lap band clinic...spoke with the patient who stated that he was OOT and that he was feeling fine..he does have an appt schd for 08/15/12 @11 :15..I advised patient that we had on call doctors at all times if any problems persist before his appt next Thursday...patient was fine with instructions and Mardelle Matte is aware as well

## 2012-08-15 ENCOUNTER — Encounter (INDEPENDENT_AMBULATORY_CARE_PROVIDER_SITE_OTHER): Payer: Self-pay

## 2012-08-15 ENCOUNTER — Ambulatory Visit (INDEPENDENT_AMBULATORY_CARE_PROVIDER_SITE_OTHER): Payer: BC Managed Care – PPO | Admitting: Physician Assistant

## 2012-08-15 ENCOUNTER — Ambulatory Visit
Admission: RE | Admit: 2012-08-15 | Discharge: 2012-08-15 | Disposition: A | Discharge status: Home / Self Care | Payer: BC Managed Care – PPO | Source: Ambulatory Visit | Attending: Physician Assistant | Admitting: Physician Assistant

## 2012-08-15 VITALS — BP 116/86 | HR 68 | Ht 71.0 in | Wt 348.4 lb

## 2012-08-15 DIAGNOSIS — Z4651 Encounter for fitting and adjustment of gastric lap band: Secondary | ICD-10-CM

## 2012-08-15 NOTE — Progress Notes (Signed)
  HISTORY: FINNICK OROSZ is a 46 y.o.male who received an AP-Large lap-band in August 2013 by Dr. Daphine Deutscher. He comes in with continued hunger and larger than desired portion sizes. He had an episode of solid food dysphagia while camping in the past month while camping which self-resolved once home. He also had a whole chicken breast followed by pie that he thought was too much at one sitting. This gave him concern for fluid leakage or slip. He says he felt restriction the day of his last adjustment but not much afterward.  VITAL SIGNS: Filed Vitals:   08/15/12 1123  BP: 116/86  Pulse: 68    PHYSICAL EXAM: Physical exam reveals a very well-appearing 46 y.o.male in no apparent distress Neurologic: Awake, alert, oriented Psych: Bright affect, conversant Respiratory: Breathing even and unlabored. No stridor or wheezing Abdomen: Soft, nontender, nondistended to palpation. Incisions well-healed. No incisional hernias. Port easily palpated. Extremities: Atraumatic, good range of motion.  ASSESMENT: 46 y.o.  male  s/p AP-Large lap-band.   PLAN: The patient's port was accessed with a 20G Huber needle without difficulty. Clear fluid was aspirated and 1 mL saline was added to the port. The patient was able to swallow water without difficulty following the procedure and was instructed to take clear liquids for the next 24-48 hours and advance slowly as tolerated. I asked him to get a KUB to assure ourselves of no slip given his larger portion sizes. We'll have him back in one month if everything looks in order.

## 2012-08-15 NOTE — Patient Instructions (Signed)
Take clear liquids tonight. Thin protein shakes are ok to start tomorrow morning. Slowly advance your diet thereafter. Call us if you have persistent vomiting or regurgitation, night cough or reflux symptoms. Return as scheduled or sooner if you notice no changes in hunger/portion sizes.  

## 2012-09-23 ENCOUNTER — Encounter: Payer: Self-pay | Admitting: *Deleted

## 2012-09-23 ENCOUNTER — Encounter: Payer: BC Managed Care – PPO | Attending: Surgery | Admitting: *Deleted

## 2012-09-23 VITALS — Ht 71.0 in | Wt 327.5 lb

## 2012-09-23 DIAGNOSIS — Z713 Dietary counseling and surveillance: Secondary | ICD-10-CM | POA: Insufficient documentation

## 2012-09-23 DIAGNOSIS — Z01818 Encounter for other preprocedural examination: Secondary | ICD-10-CM | POA: Insufficient documentation

## 2012-09-23 DIAGNOSIS — E669 Obesity, unspecified: Secondary | ICD-10-CM

## 2012-09-23 NOTE — Progress Notes (Addendum)
Follow-up visit:  4 Months Post-Operative LAGB Surgery  Medical Nutrition Therapy:  Appt start time: 830  End time:  900.  Primary concerns today: Post-operative Bariatric Surgery Nutrition Management. Band ok; no slippage or leaking.  Reports drinking 2 shakes and 1 meal a day since Thanksgiving in an effort to meet his goal of 100 lbs by Christmas.  Met goal, but has gained 12 lbs of FAT MASS.  Added 1/2 c of pecans daily (35g fat) which is likely the culprit.  In the yellow/green zone.  States he may need a small fill.   Surgery date: 05/07/12 Surgery type: LAGB Start weight at [facility]: 434.1 lbs (11/01/11)  Weight today: 327.5 lbs Weight change: 19.0 lbs Total weight lost: 106.6 lbs Weight goal: 275 lbs (first goal); 230-245 lbs (end goal by Aug 2014) % Weight goal met: 1st goal = 67%; End goal = 52-56%  TANITA  BODY COMP RESULTS  07/03/12 08/07/12 09/23/12   BMI (kg/m^2) 51.3 48.3 45.7   Fat Mass (lbs) 168.5 126.0 138.5   Fat Free Mass (lbs) 199.0 220.5 189.0   Total Body Water (lbs) 145.5 161.5 138.5   24-hr recall: B (7:30-8 AM): 20-25 oz water + 8 oz protein shake (made with milk) Snk (10:30-11 AM): Cheese stick L (12-12:30 PM): Protein shake (8 oz), cucumber (1 whole w/ no rind), SF jello Snk (PM):  Cheese stick or pecans Snk (PM): SF jello D (PM): 3-5 oz chicken breast or salmon w/ zucchini & squash Snk (PM): 5.3 oz [place] yogurt (12g) w/ 1/4 c walnuts  Fluid intake:  > 64 oz Estimated total protein intake: 85-95 g  Medications: Dulcolax Supplementation: Taking MVI daily as prescribed; no calcium today because he ran out.   Using straws: No Drinking while eating: No Hair loss: No Carbonated beverages: No N/V/D/C: Constipation continues, though not as painful. Taking dulcolax prn. Has fiber supplement, though not taking.  Last Lap-Band fill:  In green/yellow zone  Recent physical activity:  Walking stairs, walking at work;  Aims for 1-1.5 miles day over walking or  treadmill. Wants to get back into gym.   Progress Towards Goal(s):  In progress.   Nutritional Diagnosis:  [place]-3.3 Overweight/obesity As related to recent LAGB surgery.  As evidenced by patient following post-op nutrition guidelines for continued weight loss.    Intervention:  Nutrition education.  Monitoring/Evaluation:  Dietary intake, exercise, lap band fills, and body weight. Follow up in 2-3 months for 6-7 month post-op visit or sooner if needed.

## 2012-09-23 NOTE — Patient Instructions (Addendum)
Goals:  Follow Phase 3B: High Protein + Non-Starchy Vegetables  Increase lean protein foods to meet 80-100g goal  Continue fluid intake of 64oz +  Add 15 grams of carbohydrate (fruit, whole grain, starchy vegetable) with meals  Aim for >30 min of physical activity daily  Take your fiber supplement!! :)  TANITA  BODY COMP RESULTS  07/03/12 08/07/12 09/23/12   BMI (kg/m^2) 51.3 48.3 45.7   Fat Mass (lbs) 168.5 126.0 138.5   Fat Free Mass (lbs) 199.0 220.5 189.0   Total Body Water (lbs) 145.5 161.5 138.5

## 2012-09-26 ENCOUNTER — Encounter (INDEPENDENT_AMBULATORY_CARE_PROVIDER_SITE_OTHER): Payer: BC Managed Care – PPO

## 2012-10-10 ENCOUNTER — Encounter (INDEPENDENT_AMBULATORY_CARE_PROVIDER_SITE_OTHER): Payer: BC Managed Care – PPO

## 2012-11-09 ENCOUNTER — Other Ambulatory Visit: Payer: Self-pay

## 2012-11-22 ENCOUNTER — Ambulatory Visit: Payer: BC Managed Care – PPO | Admitting: *Deleted

## 2013-01-02 ENCOUNTER — Ambulatory Visit: Payer: BC Managed Care – PPO | Admitting: *Deleted

## 2013-01-08 ENCOUNTER — Ambulatory Visit: Payer: BC Managed Care – PPO | Admitting: *Deleted

## 2013-02-07 ENCOUNTER — Encounter (INDEPENDENT_AMBULATORY_CARE_PROVIDER_SITE_OTHER): Payer: Self-pay | Admitting: Surgery

## 2013-02-07 ENCOUNTER — Ambulatory Visit (INDEPENDENT_AMBULATORY_CARE_PROVIDER_SITE_OTHER): Payer: BC Managed Care – PPO | Admitting: Surgery

## 2013-02-07 VITALS — BP 130/82 | HR 68 | Temp 97.6°F | Resp 16 | Ht 71.0 in | Wt 365.4 lb

## 2013-02-07 DIAGNOSIS — Z9884 Bariatric surgery status: Secondary | ICD-10-CM

## 2013-02-07 NOTE — Patient Instructions (Addendum)
1. See Cameron Jones on May 29th.   2. Decreasing your carbohydrate intake will hasten your weight loss.  Rely more on proteins for your meals.  Avoid condiments that contain sweets such as Honey Mustard and sugary salad dressings.   3. Stay in the "green zone".  If you are regurgitating with meals, having night time reflux, and find yourself eating soft comfort foods (mashed potatoes, potato chips)...realize that you are developing "maladaptive eating".  You will not lose weight this way and may regain weight.  The GREEN ZONE is eating smaller portions and not regurgitating.  Hence we may need to withdraw fluid from your band. 4. Build exercise into your daily routine.  Walking is the best way to start but do something every day if you can.

## 2013-02-07 NOTE — Progress Notes (Signed)
Mr. Burnadette Pop called this morning after eating a BoBerry Muffin  and getting a brief food impaction.  I asked him to come over so we could check him. By the time he arrived he was able to drink a glass of water and was not obstructed. I felt was probably not a good idea to try to do any film manipulation today and he does have a plum with and he on the 29th. We will go and have him keep that appointment for consideration of a LAP-BAND refill. No charge today for this brief check

## 2013-02-20 ENCOUNTER — Encounter (INDEPENDENT_AMBULATORY_CARE_PROVIDER_SITE_OTHER): Payer: Self-pay

## 2013-02-20 ENCOUNTER — Ambulatory Visit (INDEPENDENT_AMBULATORY_CARE_PROVIDER_SITE_OTHER): Payer: BC Managed Care – PPO | Admitting: Physician Assistant

## 2013-02-20 VITALS — BP 122/84 | HR 71 | Temp 97.7°F | Resp 18 | Ht 71.0 in | Wt 362.8 lb

## 2013-02-20 DIAGNOSIS — Z4651 Encounter for fitting and adjustment of gastric lap band: Secondary | ICD-10-CM

## 2013-02-20 NOTE — Patient Instructions (Signed)
Take clear liquids tonight. Thin protein shakes are ok to start tomorrow morning. Slowly advance your diet thereafter. Call us if you have persistent vomiting or regurgitation, night cough or reflux symptoms. Return as scheduled or sooner if you notice no changes in hunger/portion sizes.  

## 2013-02-20 NOTE — Progress Notes (Signed)
  HISTORY: Cameron Jones is a 47 y.o.male who received an AP-Large lap-band in August 2013 by Dr. Daphine Deutscher. He comes in with close to 14 lbs weight gain since he was last adjusted in November 2013. He had an episode of solid food dysphagia earlier this month which self-resolved. He complains of his appetite being out of control and is concerned about weight gain. He denies persistent regurgitation or reflux but he's noted certain foods that cause trouble and avoids them. He wants a fill today.  VITAL SIGNS: Filed Vitals:   02/20/13 1605  BP: 122/84  Pulse: 71  Temp: 97.7 F (36.5 C)  Resp: 18    PHYSICAL EXAM: Physical exam reveals a very well-appearing 47 y.o.male in no apparent distress Neurologic: Awake, alert, oriented Psych: Bright affect, conversant Respiratory: Breathing even and unlabored. No stridor or wheezing Abdomen: Soft, nontender, nondistended to palpation. Incisions well-healed. No incisional hernias. Port easily palpated. Extremities: Atraumatic, good range of motion.  ASSESMENT: 47 y.o.  male  s/p AP-Large lap-band.   PLAN: The patient's port was accessed with a 20G Huber needle without difficulty. Clear fluid was aspirated and 0.75 mL saline was added to the port. The patient was able to swallow water without difficulty following the procedure and was instructed to take clear liquids for the next 24-48 hours and advance slowly as tolerated.

## 2013-03-20 ENCOUNTER — Encounter (INDEPENDENT_AMBULATORY_CARE_PROVIDER_SITE_OTHER): Payer: BC Managed Care – PPO

## 2013-04-17 ENCOUNTER — Encounter (INDEPENDENT_AMBULATORY_CARE_PROVIDER_SITE_OTHER): Payer: BC Managed Care – PPO

## 2013-04-24 ENCOUNTER — Telehealth (INDEPENDENT_AMBULATORY_CARE_PROVIDER_SITE_OTHER): Payer: Self-pay

## 2013-04-24 NOTE — Telephone Encounter (Signed)
The pt called in s/p lap band and states the last 2 weeks he has been experiencing headaches when he eats and becomes dizzy and light headed.  He asked if it's from the lap band.  I told him it should not be related.  I asked if he is eating enough and he says probably too much.  I asked him about liquid intake and he said he can work on that and probably doesn't drink enough.  I told him not to get dehydrated and to carry some water around with him to sip on all day.  I advised not to drink within 30 minutes before or after meals.  He will check with his primary md to see what might be causing it.

## 2013-05-07 ENCOUNTER — Ambulatory Visit
Admission: RE | Admit: 2013-05-07 | Discharge: 2013-05-07 | Disposition: A | Discharge status: Home / Self Care | Payer: BC Managed Care – PPO | Source: Ambulatory Visit | Attending: Family Medicine | Admitting: Family Medicine

## 2013-05-07 ENCOUNTER — Other Ambulatory Visit: Payer: Self-pay | Admitting: Family Medicine

## 2013-05-07 DIAGNOSIS — M545 Low back pain, unspecified: Secondary | ICD-10-CM

## 2013-05-07 DIAGNOSIS — M7918 Myalgia, other site: Secondary | ICD-10-CM

## 2013-05-08 ENCOUNTER — Other Ambulatory Visit: Payer: Self-pay | Admitting: Gastroenterology

## 2013-05-09 NOTE — Addendum Note (Signed)
Addended by: Ziomara Birenbaum JR. on: 05/09/2013 09:36 AM   Modules accepted: Orders  

## 2013-05-13 ENCOUNTER — Ambulatory Visit (HOSPITAL_COMMUNITY)
Admission: RE | Admit: 2013-05-13 | Discharge: 2013-05-13 | Disposition: A | Discharge status: Home / Self Care | Payer: BC Managed Care – PPO | Source: Ambulatory Visit | Attending: Gastroenterology | Admitting: Gastroenterology

## 2013-05-13 ENCOUNTER — Encounter (HOSPITAL_COMMUNITY): Payer: Self-pay | Admitting: *Deleted

## 2013-05-13 ENCOUNTER — Encounter (HOSPITAL_COMMUNITY)
Admission: RE | Disposition: A | Discharge status: Home / Self Care | Payer: Self-pay | Source: Ambulatory Visit | Attending: Gastroenterology

## 2013-05-13 DIAGNOSIS — Z9049 Acquired absence of other specified parts of digestive tract: Secondary | ICD-10-CM | POA: Insufficient documentation

## 2013-05-13 DIAGNOSIS — Z85038 Personal history of other malignant neoplasm of large intestine: Secondary | ICD-10-CM | POA: Insufficient documentation

## 2013-05-13 DIAGNOSIS — Z800 Family history of malignant neoplasm of digestive organs: Secondary | ICD-10-CM | POA: Insufficient documentation

## 2013-05-13 DIAGNOSIS — Z1211 Encounter for screening for malignant neoplasm of colon: Secondary | ICD-10-CM | POA: Insufficient documentation

## 2013-05-13 HISTORY — PX: COLONOSCOPY: SHX5424

## 2013-05-13 SURGERY — COLONOSCOPY
Anesthesia: Moderate Sedation

## 2013-05-13 MED ORDER — FENTANYL CITRATE 0.05 MG/ML IJ SOLN
INTRAMUSCULAR | Status: DC | PRN
  Administered 2013-05-13 (×2): 25 ug via INTRAVENOUS

## 2013-05-13 MED ORDER — MIDAZOLAM HCL 5 MG/5ML IJ SOLN
INTRAMUSCULAR | Status: DC | PRN
  Administered 2013-05-13: 2 mg via INTRAVENOUS
  Administered 2013-05-13: 1 mg via INTRAVENOUS
  Administered 2013-05-13: 2 mg via INTRAVENOUS

## 2013-05-13 MED ORDER — MIDAZOLAM HCL 10 MG/2ML IJ SOLN
INTRAMUSCULAR | Status: AC
  Filled 2013-05-13: qty 2

## 2013-05-13 MED ORDER — FENTANYL CITRATE 0.05 MG/ML IJ SOLN
INTRAMUSCULAR | Status: AC
  Filled 2013-05-13: qty 2

## 2013-05-13 MED ORDER — SODIUM CHLORIDE 0.9 % IV SOLN
INTRAVENOUS | Status: DC
  Administered 2013-05-13: 500 mL via INTRAVENOUS

## 2013-05-13 NOTE — H&P (Signed)
  Subjective:   Patient is a 47 y.o. male presents with history of right in the colectomy due to colon cancer 2007 for surveillance colonoscopy.. Procedure including risks and benefits discussed in office.  Patient Active Problem List   Diagnosis Date Noted  . 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 Colon cancer-right T3 N0, Oct 2007 10/06/2011  . Ventral hernia-midline 10/06/2011  . Obstructive sleep apnea of adult 10/06/2011  . Obesity BMI 61 10/06/2011   Past Medical History  Diagnosis Date  . Lower back pain   . Obesity   . Hernia   . Cancer     colon  . History of blood transfusion   . GERD (gastroesophageal reflux disease)   . Arthritis     Knee  . Fatty liver     per Dr Mayford Knife note  . Ventral hernia   . Carpal tunnel syndrome of right wrist   . Sleep apnea     severe per report 12/11  on chart    Past Surgical History  Procedure Laterality Date  . Colon surgery  06/2006    colectomy  . Breath tek h pylori  11/07/2011    Procedure: BREATH TEK H PYLORI;  Surgeon: Kandis Cocking, MD;  Location: [facility];  Service: General;  Laterality: N/A;  . Breath tek h pylori  01/02/2012    Procedure: BREATH TEK H PYLORI;  Surgeon: Valarie Merino, MD;  Location: [facility];  Service: General;  Laterality: N/A;  . Laparoscopic gastric banding  05/07/2012    Procedure: LAPAROSCOPIC GASTRIC BANDING;  Surgeon: Valarie Merino, MD;  Location: [facility];  Service: General;  Laterality: N/A;    Prescriptions prior to admission  Medication Sig Dispense Refill  . CALCIUM CITRATE PO Take 600 mg by mouth 2 (two) times daily.      . Multiple Vitamin (MULTIVITAMIN) tablet Take 1 tablet by mouth daily.       No Known Allergies  History  Substance Use Topics  . Smoking status: Never Smoker   . Smokeless tobacco: Never Used  . Alcohol Use: 0.5 oz/week    1 drink(s) per week     Comment: wine  1 glass evry 2-3 months    Family History  Problem Relation Age of Onset  . Cancer Mother      pancreatic  . Cancer Brother     colon  . Cancer Maternal Aunt     breast  . Alzheimer's disease Father   . Depression Brother      Objective:   Patient Vitals for the past 8 hrs:  BP Temp Temp src Resp SpO2 Height Weight  05/13/13 1314 137/77 mmHg 98.3 F (36.8 C) Oral 27 95 % 5\' 11"  (1.803 m) 171.006 kg (377 lb)         See MD Preop evaluation      Assessment:   1. 1. History of colon cancer status post right hemicolectomy for 5 years surveillance colonoscopy  Plan:   Will proceed with surveillance colonoscopy at this time.

## 2013-05-13 NOTE — Op Note (Signed)
[facility] [address] [place], [address]   COLONOSCOPY PROCEDURE REPORT  PATIENT: Cameron, Jones  MR#: [id number] BIRTHDATE: 24-Apr-1966 , 47  yrs. old GENDER: Male ENDOSCOPIST: Carman Ching, MD REFERRED BY:   Dr. Jillyn Hidden PROCEDURE DATE:  05/13/2013 PROCEDURE:  Colonoscopy ASA CLASS:    class 2 INDICATIONS:  surveillance, history of colon cancer status post right hemicolectomy. His brother also had colon cancer at age 45 MEDICATIONS:  DESCRIPTION OF PROCEDURE: The Pentax adult: the scope inserted and advanced. Prep excellent are able to reach the anastomosis using the abdominal pressure. The ileum anastomosis was somewhat angulated and cannot be passed.. The scope was slowly withdrawn in the colon carefully examine. No polyps or other lesions were seen. Internal hemorrhoids seen in the rectum. The patient tolerated procedure well there were no immediate complications.     COMPLICATIONS: None  ENDOSCOPIC IMPRESSION:  RECOMMENDATIONS: 1.Status Post Right Hemicolectomy for colon cancer. Normal surveillance colonoscopy. 2. Repeat colonoscopy in 5 years    _______________________________ eSigned:  Carman Ching, MD 05/13/2013 3:01 PM

## 2013-05-14 ENCOUNTER — Encounter (HOSPITAL_COMMUNITY): Payer: Self-pay | Admitting: Gastroenterology

## 2013-05-23 ENCOUNTER — Encounter (INDEPENDENT_AMBULATORY_CARE_PROVIDER_SITE_OTHER): Payer: Self-pay | Admitting: Surgery

## 2013-05-23 ENCOUNTER — Ambulatory Visit (INDEPENDENT_AMBULATORY_CARE_PROVIDER_SITE_OTHER): Payer: BC Managed Care – PPO | Admitting: Surgery

## 2013-05-23 ENCOUNTER — Other Ambulatory Visit (INDEPENDENT_AMBULATORY_CARE_PROVIDER_SITE_OTHER): Payer: Self-pay

## 2013-05-23 VITALS — BP 138/88 | HR 78 | Temp 96.6°F | Ht 71.0 in | Wt 374.0 lb

## 2013-05-23 DIAGNOSIS — R4702 Dysphasia: Secondary | ICD-10-CM

## 2013-05-23 DIAGNOSIS — Z4651 Encounter for fitting and adjustment of gastric lap band: Secondary | ICD-10-CM

## 2013-05-23 DIAGNOSIS — Z9884 Bariatric surgery status: Secondary | ICD-10-CM

## 2013-05-23 NOTE — Progress Notes (Signed)
Lapband Fill Encounter Problem List:   Patient Active Problem List   Diagnosis Date Noted  . 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 Colon cancer-right T3 N0, Oct 2007 10/06/2011  . Ventral hernia-midline 10/06/2011  . Obstructive sleep apnea of adult 10/06/2011  . Obesity BMI 61 10/06/2011    Carole Balaguer Body mass index is 52.19 kg/(m^2). Weight loss since surgery  41 pounds  Having regurgitation?:  yes  Feel that they need a fill?  Concern that he is too tight or something that happened.  Nocturnal reflux?  some  Amount of fill  -0.7     Instructions given and weight loss goals discussed.    Since last week Mr. Barro is noticed things hanging out. This was occurred with solids as well as some liquids. I went ahead and removed  0.7 cc from his  Band.  I want  to get upper GI to see if he has evidence of slippage.  Was able to drink much differently after are removed the fluid from his band. We will get an upper GI which was scheduled for September 9 will see him back after that he consider him for fill. We'll make an appointment with Mardelle Matte.    Matt B. Daphine Deutscher, MD, FACS

## 2013-05-23 NOTE — Patient Instructions (Signed)

## 2013-06-03 ENCOUNTER — Other Ambulatory Visit (INDEPENDENT_AMBULATORY_CARE_PROVIDER_SITE_OTHER): Payer: Self-pay | Admitting: Surgery

## 2013-06-03 ENCOUNTER — Ambulatory Visit
Admission: RE | Admit: 2013-06-03 | Discharge: 2013-06-03 | Disposition: A | Discharge status: Home / Self Care | Payer: BC Managed Care – PPO | Source: Ambulatory Visit | Attending: Surgery | Admitting: Surgery

## 2013-06-03 DIAGNOSIS — R4702 Dysphasia: Secondary | ICD-10-CM

## 2013-06-12 ENCOUNTER — Encounter (INDEPENDENT_AMBULATORY_CARE_PROVIDER_SITE_OTHER): Payer: Self-pay

## 2013-06-12 ENCOUNTER — Ambulatory Visit (INDEPENDENT_AMBULATORY_CARE_PROVIDER_SITE_OTHER): Payer: BC Managed Care – PPO | Admitting: Physician Assistant

## 2013-06-12 VITALS — BP 138/80 | HR 72 | Resp 16 | Ht 71.0 in | Wt 383.0 lb

## 2013-06-12 DIAGNOSIS — Z4651 Encounter for fitting and adjustment of gastric lap band: Secondary | ICD-10-CM

## 2013-06-12 NOTE — Progress Notes (Signed)
  HISTORY: Cameron Jones is a 47 y.o.male who received an AP-Large lap-band in August 2013 by Dr. Daphine Deutscher. He comes in with 9 lbs weight gain since 0.7 mL was removed in August due to sudden obstructive symptoms. He has no such symptoms now and is actually having increasing hunger and portion sizes. He had a rather stressful past three months which contributed to his weight gain but he is ready to get back on track.  VITAL SIGNS: Filed Vitals:   06/12/13 1404  BP: 138/80  Pulse: 72  Resp: 16    PHYSICAL EXAM: Physical exam reveals a very well-appearing 47 y.o.male in no apparent distress Neurologic: Awake, alert, oriented Psych: Bright affect, conversant Respiratory: Breathing even and unlabored. No stridor or wheezing Abdomen: Soft, nontender, nondistended to palpation. Incisions well-healed. No incisional hernias. Port easily palpated. Extremities: Atraumatic, good range of motion.  ASSESMENT: 47 y.o.  male  s/p AP-Large lap-band.   PLAN: The patient's port was accessed with a 20G Huber needle without difficulty. Clear fluid was aspirated and 0.5 mL saline was added to the port. The patient was able to swallow water without difficulty following the procedure and was instructed to take clear liquids for the next 24-48 hours and advance slowly as tolerated.

## 2013-06-12 NOTE — Patient Instructions (Signed)

## 2013-06-19 ENCOUNTER — Encounter (INDEPENDENT_AMBULATORY_CARE_PROVIDER_SITE_OTHER): Payer: BC Managed Care – PPO

## 2013-07-31 ENCOUNTER — Other Ambulatory Visit: Payer: Self-pay

## 2013-09-11 ENCOUNTER — Encounter (INDEPENDENT_AMBULATORY_CARE_PROVIDER_SITE_OTHER): Payer: BC Managed Care – PPO

## 2013-12-08 ENCOUNTER — Telehealth (INDEPENDENT_AMBULATORY_CARE_PROVIDER_SITE_OTHER): Payer: Self-pay | Admitting: General Surgery

## 2013-12-08 NOTE — Telephone Encounter (Signed)
Lap band pt of Dr. Hassell Done called to report he has just come from his PCP, Dr. Chapman Fitch at [facility], where he was seen for reflux.  She has started him on Dexilant and is scheduling him for UGI.  Made appt with pt for 12/18/13 to the [facility] and instructed him to bring a CD-Rom copy of the UGI imaging with him.  He understands to call back if his symptoms worsen.

## 2013-12-09 ENCOUNTER — Other Ambulatory Visit: Payer: Self-pay | Admitting: Gastroenterology

## 2013-12-12 ENCOUNTER — Encounter (INDEPENDENT_AMBULATORY_CARE_PROVIDER_SITE_OTHER): Payer: BC Managed Care – PPO | Admitting: Surgery

## 2013-12-18 ENCOUNTER — Encounter (INDEPENDENT_AMBULATORY_CARE_PROVIDER_SITE_OTHER): Payer: Self-pay

## 2013-12-18 ENCOUNTER — Ambulatory Visit (INDEPENDENT_AMBULATORY_CARE_PROVIDER_SITE_OTHER): Payer: Commercial Indemnity | Admitting: Physician Assistant

## 2013-12-18 ENCOUNTER — Encounter (INDEPENDENT_AMBULATORY_CARE_PROVIDER_SITE_OTHER): Payer: BC Managed Care – PPO

## 2013-12-18 VITALS — BP 150/90 | HR 72 | Temp 98.7°F | Resp 14 | Ht 71.0 in | Wt 396.8 lb

## 2013-12-18 DIAGNOSIS — Z4651 Encounter for fitting and adjustment of gastric lap band: Secondary | ICD-10-CM

## 2013-12-18 DIAGNOSIS — R1310 Dysphagia, unspecified: Secondary | ICD-10-CM

## 2013-12-18 NOTE — Patient Instructions (Signed)
Return in two weeks. Focus on good food choices as well as physical activity. Return sooner if you have an increase in hunger, portion sizes or weight. Return also for difficulty swallowing, night cough, reflux.   

## 2013-12-18 NOTE — Progress Notes (Addendum)
  HISTORY: Cameron Jones is a 48 y.o.male who received an AP-Large lap-band in August 2013 by Dr. Hassell Jones. He's had a rough month. Beginning about one month ago, he noticed increased reflux symptoms. He attributed this to stress due to studying for continuing education. Unfortunately this continued to progress. He recently saw Dr. Oletta Jones with GI who performed an upper endoscopy. A report from this is pending. Pathology report showed chronic gastritis but negative H. Pylori. He is still having solid food dysphagia and even liquids cause considerable discomfort.  VITAL SIGNS: Filed Vitals:   12/18/13 1439  BP: 150/90  Pulse: 72  Temp: 98.7 F (37.1 C)  Resp: 14    PHYSICAL EXAM: Physical exam reveals a very well-appearing 48 y.o.male in no apparent distress Neurologic: Awake, alert, oriented Psych: Bright affect, conversant Respiratory: Breathing even and unlabored. No stridor or wheezing Abdomen: Soft, nontender, nondistended to palpation. Incisions well-healed. No incisional hernias. Port easily palpated. Extremities: Atraumatic, good range of motion.  ASSESMENT: 48 y.o.  male  s/p AP-Large lap-band.   PLAN: The patient's port was accessed with a 20G Huber needle without difficulty. Clear fluid was aspirated and 5 mL saline was removed from the port. The patient was advised to concentrate on healthy food choices and to avoid slider foods high in fats and carbohydrates. He was able to drink water with far greater ease following fluid removal. I encouraged him to continue the [place] and to stay on liquids for the next couple of days. We'll have him back in two weeks.  [The patient had an normal appearing UGI in 9.05/2013 for similar sounding symptoms.   DN 12/19/2013]

## 2014-01-01 ENCOUNTER — Encounter (INDEPENDENT_AMBULATORY_CARE_PROVIDER_SITE_OTHER): Payer: Commercial Indemnity

## 2014-01-07 ENCOUNTER — Encounter (INDEPENDENT_AMBULATORY_CARE_PROVIDER_SITE_OTHER): Payer: Self-pay

## 2016-03-19 ENCOUNTER — Emergency Department (HOSPITAL_COMMUNITY)
Admission: EM | Admit: 2016-03-19 | Discharge: 2016-03-19 | Disposition: A | Discharge status: Home / Self Care | Payer: Managed Care, Other (non HMO) | Attending: Emergency Medicine | Admitting: Emergency Medicine

## 2016-03-19 ENCOUNTER — Emergency Department (HOSPITAL_COMMUNITY): Payer: Managed Care, Other (non HMO)

## 2016-03-19 ENCOUNTER — Encounter (HOSPITAL_COMMUNITY): Payer: Self-pay | Admitting: *Deleted

## 2016-03-19 DIAGNOSIS — R51 Headache: Secondary | ICD-10-CM | POA: Diagnosis present

## 2016-03-19 DIAGNOSIS — Z791 Long term (current) use of non-steroidal anti-inflammatories (NSAID): Secondary | ICD-10-CM | POA: Insufficient documentation

## 2016-03-19 DIAGNOSIS — Z79899 Other long term (current) drug therapy: Secondary | ICD-10-CM | POA: Diagnosis not present

## 2016-03-19 DIAGNOSIS — R519 Headache, unspecified: Secondary | ICD-10-CM

## 2016-03-19 DIAGNOSIS — R110 Nausea: Secondary | ICD-10-CM | POA: Diagnosis not present

## 2016-03-19 DIAGNOSIS — M1990 Unspecified osteoarthritis, unspecified site: Secondary | ICD-10-CM | POA: Insufficient documentation

## 2016-03-19 DIAGNOSIS — Z85038 Personal history of other malignant neoplasm of large intestine: Secondary | ICD-10-CM | POA: Diagnosis not present

## 2016-03-19 MED ORDER — METOCLOPRAMIDE HCL 5 MG/ML IJ SOLN
10.0000 mg | Freq: Once | INTRAMUSCULAR | Status: AC
  Administered 2016-03-19: 10 mg via INTRAVENOUS
  Filled 2016-03-19: qty 2

## 2016-03-19 MED ORDER — SODIUM CHLORIDE 0.9 % IV BOLUS (SEPSIS)
500.0000 mL | Freq: Once | INTRAVENOUS | Status: AC
  Administered 2016-03-19: 500 mL via INTRAVENOUS

## 2016-03-19 MED ORDER — DEXAMETHASONE SODIUM PHOSPHATE 10 MG/ML IJ SOLN
10.0000 mg | Freq: Once | INTRAMUSCULAR | Status: AC
  Administered 2016-03-19: 10 mg via INTRAVENOUS
  Filled 2016-03-19: qty 1

## 2016-03-19 MED ORDER — DIPHENHYDRAMINE HCL 50 MG/ML IJ SOLN
12.5000 mg | Freq: Once | INTRAMUSCULAR | Status: AC
  Administered 2016-03-19: 50 mg via INTRAVENOUS
  Filled 2016-03-19: qty 1

## 2016-03-19 NOTE — ED Notes (Signed)
Pt complains of headache, dry heaving and weakness since this morning. Pt denies abdominal pain or diarrhea.

## 2016-03-19 NOTE — Discharge Instructions (Signed)
General Headache Without Cause A headache is pain or discomfort felt around the head or neck area. There are many causes and types of headaches. In some cases, the cause may not be found.  HOME CARE  Managing Pain  Take over-the-counter and prescription medicines only as told by your doctor.  Lie down in a dark, quiet room when you have a headache.  If directed, apply ice to the head and neck area:  Put ice in a plastic bag.  Place a towel between your skin and the bag.  Leave the ice on for 20 minutes, 2-3 times per day.  Use a heating pad or hot shower to apply heat to the head and neck area as told by your doctor.  Keep lights dim if bright lights bother you or make your headaches worse. Eating and Drinking  Eat meals on a regular schedule.  Lessen how much alcohol you drink.  Lessen how much caffeine you drink, or stop drinking caffeine. General Instructions  Keep all follow-up visits as told by your doctor. This is important.  Keep a journal to find out if certain things bring on headaches. For example, write down:  What you eat and drink.  How much sleep you get.  Any change to your diet or medicines.  Relax by getting a massage or doing other relaxing activities.  Lessen stress.  Sit up straight. Do not tighten (tense) your muscles.  Do not use tobacco products. This includes cigarettes, chewing tobacco, or e-cigarettes. If you need help quitting, ask your doctor.  Exercise regularly as told by your doctor.  Get enough sleep. This often means 7-9 hours of sleep. GET HELP IF:  Your symptoms are not helped by medicine.  You have a headache that feels different than the other headaches.  You feel sick to your stomach (nauseous) or you throw up (vomit).  You have a fever. GET HELP RIGHT AWAY IF:   Your headache becomes really bad.  You keep throwing up.  You have a stiff neck.  You have trouble seeing.  You have trouble speaking.  You have  pain in the eye or ear.  Your muscles are weak or you lose muscle control.  You lose your balance or have trouble walking.  You feel like you will pass out (faint) or you pass out.  You have confusion.   This information is not intended to replace advice given to you by your health care provider. Make sure you discuss any questions you have with your health care provider.    Your CT of your head was normal today. Follow up with your primary care provider for re-evaluation. Return to the ED if you experience severe worsening of your symptoms, change in vision, weakness or numbness in an extremity, neck pain or stiffness, loss of consciousness.

## 2016-03-19 NOTE — ED Notes (Signed)
Pt resting in bed, c/o headache starting this morning in forehead area accompanied with dry heaves. No diarrhea, headache and dry heaves continue this afternoon.

## 2016-03-19 NOTE — ED Notes (Signed)
Pt has received some relief form combination of meds and fluids

## 2016-03-19 NOTE — ED Provider Notes (Signed)
CSN: [id number]     Arrival date & time 03/19/16  60 History   First MD Initiated Contact with Patient 03/19/16 1549     Chief Complaint  Patient presents with  . Headache  . Nausea     (Consider location/radiation/quality/duration/timing/severity/associated sxs/prior Treatment) HPI   Cameron Jones is a 50 y.o M with a pmhx of colon Ca, ventral hernia, obesity presents to the ED today c/o headache. Pt states that when he woke up this morning he had a mild frontal headache. Pt states it eventually eased off without treatment. However, several hours ago the headache came back and was much more painful. Pt states that it feels like pressure across his forehead and it radiates down the right side of his face. Pt reports associated nausea and dry heaving. He also had one episode of diarrhea. He has not tried taking anything for his symptoms. Pt has not had headaches similar to this in the past. He denies change in vision, weakness or numbness in an extremity, fever, neck pain or stiffness. No hx of HTN or CVA.   Past Medical History  Diagnosis Date  . Lower back pain   . Obesity   . Hernia   . Cancer ([place])     colon  . History of blood transfusion   . GERD (gastroesophageal reflux disease)   . Arthritis     Knee  . Fatty liver     per Dr Radford Pax note  . Ventral hernia   . Carpal tunnel syndrome of right wrist   . Sleep apnea     severe per report 12/11  on chart   Past Surgical History  Procedure Laterality Date  . Colon surgery  06/2006    colectomy  . Breath tek h pylori  11/07/2011    Procedure: BREATH TEK H PYLORI;  Surgeon: Shann Medal, MD;  Location: [facility];  Service: General;  Laterality: N/A;  . Breath tek h pylori  01/02/2012    Procedure: BREATH TEK H PYLORI;  Surgeon: Pedro Earls, MD;  Location: [facility];  Service: General;  Laterality: N/A;  . Laparoscopic gastric banding  05/07/2012    Procedure: LAPAROSCOPIC GASTRIC BANDING;  Surgeon: Pedro Earls, MD;  Location: [facility];  Service: General;  Laterality: N/A;  . Colonoscopy N/A 05/13/2013    Procedure: COLONOSCOPY;  Surgeon: Winfield Cunas., MD;  Location: [facility];  Service: Endoscopy;  Laterality: N/A;   Family History  Problem Relation Age of Onset  . Cancer Mother     pancreatic  . Cancer Brother     colon  . Cancer Maternal Aunt     breast  . Alzheimer's disease Father   . Depression Brother    Social History  Substance Use Topics  . Smoking status: Never Smoker   . Smokeless tobacco: Never Used  . Alcohol Use: 0.5 oz/week    1 drink(s) per week     Comment: wine  1 glass evry 2-3 months    Review of Systems  All other systems reviewed and are negative.     Allergies  Review of patient's allergies indicates no known allergies.  Home Medications   Prior to Admission medications   Medication Sig Start Date End Date Taking? Authorizing Provider  CALCIUM CITRATE PO Take 600 mg by mouth 2 (two) times daily.    Historical Provider, MD  Dexlansoprazole (DEXILANT) 30 MG capsule Take 30 mg by mouth daily.    Historical Provider,  MD  Multiple Vitamin (MULTIVITAMIN) tablet Take 1 tablet by mouth daily.    Historical Provider, MD  naproxen sodium (ANAPROX) 220 MG tablet Take 220 mg by mouth 2 (two) times daily with a meal.    Historical Provider, MD  omeprazole (PRILOSEC) 10 MG capsule Take 10 mg by mouth daily.    Historical Provider, MD   BP 151/75 mmHg  Pulse 79  Temp(Src) 97.6 F (36.4 C) (Oral)  Resp 18  SpO2 99% Physical Exam  Constitutional: He is oriented to person, place, and time. He appears well-developed and well-nourished. No distress.  HENT:  Head: Normocephalic and atraumatic.  Mouth/Throat: No oropharyngeal exudate.  Eyes: Conjunctivae and EOM are normal. Pupils are equal, round, and reactive to light. Right eye exhibits no discharge. Left eye exhibits no discharge. No scleral icterus.  Neck: Normal range of motion. Neck supple.  No  meningismus.  Cardiovascular: Normal rate, regular rhythm, normal heart sounds and intact distal pulses.  Exam reveals no gallop and no friction rub.   No murmur heard. Pulmonary/Chest: Effort normal and breath sounds normal. No respiratory distress. He has no wheezes. He has no rales. He exhibits no tenderness.  Abdominal: Soft. He exhibits no distension. There is no tenderness. There is no guarding.  Musculoskeletal: Normal range of motion. He exhibits no edema.  Neurological: He is alert and oriented to person, place, and time. He has normal reflexes. No cranial nerve deficit. He exhibits normal muscle tone.  Strength 5/5 throughout. No sensory deficits.  No dysmetria. No facial droop. No pronator drift. No gait abnormality.   Skin: Skin is warm and dry. No rash noted. He is not diaphoretic. No erythema. No pallor.  Psychiatric: He has a normal mood and affect. His behavior is normal.  Nursing note and vitals reviewed.   ED Course  Procedures (including critical care time) Labs Review Labs Reviewed - No data to display  Imaging Review Ct Head Wo Contrast  03/19/2016  CLINICAL DATA:  Headache, vomiting and weakness since this morning. EXAM: CT HEAD WITHOUT CONTRAST TECHNIQUE: Contiguous axial images were obtained from the base of the skull through the vertex without intravenous contrast. COMPARISON:  04/14/2007 FINDINGS: The ventricles are normal in size and configuration. No extra-axial fluid collections are identified. The gray-white differentiation is normal. No CT findings for acute intracranial process such as hemorrhage or infarction. No mass lesions. The brainstem and cerebellum are grossly normal. The bony structures are intact. The paranasal sinuses and mastoid air cells are clear. The globes are intact. IMPRESSION: Normal head CT. Electronically Signed   By: Marijo Sanes M.D.   On: 03/19/2016 17:56   I have personally reviewed and evaluated these images and lab results as part of  my medical decision-making.   EKG Interpretation None      MDM   Final diagnoses:  Nonintractable headache, unspecified chronicity pattern, unspecified headache type    50 y.o M with a pmhx of colon Ca presents to the ED c/o HA. Pt has hx of HA, but states that this feels different, causing him to vomit. No neuro deficits on exam. HA feels like pressure, not described as "thunderclap". Doubt SAH. No menigismus or fever. Pt given IV fluids, reglan, decadron, and benadryl with complete symptomatic relief. No further episodes of emesis while in ED. CT head obtained given hx of Ca and HA with different features from previous HA which was negative for any acute intracranial abnormality.   The patient appears reasonably screened and/or stabilized  for discharge and I doubt any other medical condition or other [facility] requiring further screening, evaluation, or treatment in the ED at this time prior to discharge. Case discussed with Dr. Laneta Simmers who agrees with treatment plan.     Dondra Spry [place], PA-C 03/23/16 1347  Leo Grosser, MD 03/27/16 [phone]

## 2016-08-05 LAB — BASIC METABOLIC PANEL: Glucose: 103 mg/dL

## 2016-12-05 ENCOUNTER — Encounter (HOSPITAL_COMMUNITY): Payer: Self-pay

## 2016-12-27 ENCOUNTER — Emergency Department (HOSPITAL_COMMUNITY): Payer: Managed Care, Other (non HMO)

## 2016-12-27 ENCOUNTER — Inpatient Hospital Stay (HOSPITAL_COMMUNITY)
Admission: EM | Admit: 2016-12-27 | Discharge: 2016-12-31 | DRG: 389 | Disposition: A | Discharge status: Home / Self Care | Payer: Managed Care, Other (non HMO) | Attending: Emergency Medicine | Admitting: Emergency Medicine

## 2016-12-27 ENCOUNTER — Encounter (HOSPITAL_COMMUNITY): Payer: Self-pay

## 2016-12-27 ENCOUNTER — Observation Stay (HOSPITAL_COMMUNITY): Payer: Managed Care, Other (non HMO)

## 2016-12-27 DIAGNOSIS — Z820 Family history of epilepsy and other diseases of the nervous system: Secondary | ICD-10-CM

## 2016-12-27 DIAGNOSIS — K4090 Unilateral inguinal hernia, without obstruction or gangrene, not specified as recurrent: Secondary | ICD-10-CM | POA: Diagnosis present

## 2016-12-27 DIAGNOSIS — Z85038 Personal history of other malignant neoplasm of large intestine: Secondary | ICD-10-CM

## 2016-12-27 DIAGNOSIS — K56600 Partial intestinal obstruction, unspecified as to cause: Principal | ICD-10-CM | POA: Diagnosis present

## 2016-12-27 DIAGNOSIS — Z0189 Encounter for other specified special examinations: Secondary | ICD-10-CM | POA: Diagnosis not present

## 2016-12-27 DIAGNOSIS — Z933 Colostomy status: Secondary | ICD-10-CM

## 2016-12-27 DIAGNOSIS — Z818 Family history of other mental and behavioral disorders: Secondary | ICD-10-CM

## 2016-12-27 DIAGNOSIS — K56609 Unspecified intestinal obstruction, unspecified as to partial versus complete obstruction: Secondary | ICD-10-CM | POA: Diagnosis present

## 2016-12-27 DIAGNOSIS — G4733 Obstructive sleep apnea (adult) (pediatric): Secondary | ICD-10-CM | POA: Diagnosis present

## 2016-12-27 DIAGNOSIS — Z6841 Body Mass Index (BMI) 40.0 and over, adult: Secondary | ICD-10-CM

## 2016-12-27 DIAGNOSIS — Z809 Family history of malignant neoplasm, unspecified: Secondary | ICD-10-CM

## 2016-12-27 DIAGNOSIS — K436 Other and unspecified ventral hernia with obstruction, without gangrene: Secondary | ICD-10-CM

## 2016-12-27 DIAGNOSIS — Z79899 Other long term (current) drug therapy: Secondary | ICD-10-CM

## 2016-12-27 DIAGNOSIS — K432 Incisional hernia without obstruction or gangrene: Secondary | ICD-10-CM | POA: Diagnosis present

## 2016-12-27 DIAGNOSIS — K219 Gastro-esophageal reflux disease without esophagitis: Secondary | ICD-10-CM | POA: Diagnosis present

## 2016-12-27 DIAGNOSIS — Z9884 Bariatric surgery status: Secondary | ICD-10-CM

## 2016-12-27 DIAGNOSIS — Z9049 Acquired absence of other specified parts of digestive tract: Secondary | ICD-10-CM

## 2016-12-27 DIAGNOSIS — M1990 Unspecified osteoarthritis, unspecified site: Secondary | ICD-10-CM | POA: Diagnosis present

## 2016-12-27 LAB — COMPREHENSIVE METABOLIC PANEL
ALBUMIN: 4.9 g/dL (ref 3.5–5.0)
ALT: 65 U/L — AB (ref 17–63)
AST: 44 U/L — AB (ref 15–41)
Alkaline Phosphatase: 40 U/L (ref 38–126)
Anion gap: 10 (ref 5–15)
BILIRUBIN TOTAL: 0.9 mg/dL (ref 0.3–1.2)
BUN: 14 mg/dL (ref 6–20)
CALCIUM: 10.1 mg/dL (ref 8.9–10.3)
CO2: 28 mmol/L (ref 22–32)
CREATININE: 1.06 mg/dL (ref 0.61–1.24)
Chloride: 101 mmol/L (ref 101–111)
Glucose, Bld: 134 mg/dL — ABNORMAL HIGH (ref 65–99)
Potassium: 3.7 mmol/L (ref 3.5–5.1)
Sodium: 139 mmol/L (ref 135–145)
TOTAL PROTEIN: 8 g/dL (ref 6.5–8.1)

## 2016-12-27 LAB — URINALYSIS, ROUTINE W REFLEX MICROSCOPIC
BILIRUBIN URINE: NEGATIVE
Glucose, UA: NEGATIVE mg/dL
HGB URINE DIPSTICK: NEGATIVE
Ketones, ur: 20 mg/dL — AB
Leukocytes, UA: NEGATIVE
Nitrite: NEGATIVE
PROTEIN: NEGATIVE mg/dL
Specific Gravity, Urine: 1.016 (ref 1.005–1.030)
pH: 8 (ref 5.0–8.0)

## 2016-12-27 LAB — I-STAT TROPONIN, ED: TROPONIN I, POC: 0.01 ng/mL (ref 0.00–0.08)

## 2016-12-27 LAB — CBC
HCT: 48.1 % (ref 39.0–52.0)
Hemoglobin: 16.2 g/dL (ref 13.0–17.0)
MCH: 28.9 pg (ref 26.0–34.0)
MCHC: 33.7 g/dL (ref 30.0–36.0)
MCV: 85.9 fL (ref 78.0–100.0)
PLATELETS: 189 10*3/uL (ref 150–400)
RBC: 5.6 MIL/uL (ref 4.22–5.81)
RDW: 13.7 % (ref 11.5–15.5)
WBC: 6.7 10*3/uL (ref 4.0–10.5)

## 2016-12-27 LAB — LIPASE, BLOOD: Lipase: 24 U/L (ref 11–51)

## 2016-12-27 LAB — I-STAT CG4 LACTIC ACID, ED: Lactic Acid, Venous: 2.54 mmol/L (ref 0.5–1.9)

## 2016-12-27 LAB — LACTIC ACID, PLASMA: Lactic Acid, Venous: 1.2 mmol/L (ref 0.5–1.9)

## 2016-12-27 MED ORDER — HYDROMORPHONE HCL 1 MG/ML IJ SOLN
1.0000 mg | INTRAMUSCULAR | Status: DC | PRN
  Administered 2016-12-27 – 2016-12-28 (×2): 1 mg via INTRAVENOUS
  Filled 2016-12-27 (×2): qty 1

## 2016-12-27 MED ORDER — ORAL CARE MOUTH RINSE
15.0000 mL | Freq: Two times a day (BID) | OROMUCOSAL | Status: DC
  Administered 2016-12-27 – 2016-12-30 (×6): 15 mL via OROMUCOSAL

## 2016-12-27 MED ORDER — PHENOL 1.4 % MT LIQD
1.0000 | OROMUCOSAL | Status: DC | PRN
  Filled 2016-12-27: qty 177

## 2016-12-27 MED ORDER — ONDANSETRON HCL 4 MG/2ML IJ SOLN
4.0000 mg | Freq: Once | INTRAMUSCULAR | Status: AC | PRN
  Administered 2016-12-27: 4 mg via INTRAVENOUS
  Filled 2016-12-27: qty 2

## 2016-12-27 MED ORDER — PANTOPRAZOLE SODIUM 40 MG IV SOLR
40.0000 mg | Freq: Every day | INTRAVENOUS | Status: DC
  Administered 2016-12-27 – 2016-12-30 (×4): 40 mg via INTRAVENOUS
  Filled 2016-12-27 (×4): qty 40

## 2016-12-27 MED ORDER — IOPAMIDOL (ISOVUE-300) INJECTION 61%
INTRAVENOUS | Status: AC
  Filled 2016-12-27: qty 100

## 2016-12-27 MED ORDER — SODIUM CHLORIDE 0.9 % IV BOLUS (SEPSIS)
1000.0000 mL | Freq: Once | INTRAVENOUS | Status: AC
  Administered 2016-12-27: 1000 mL via INTRAVENOUS

## 2016-12-27 MED ORDER — CHLORHEXIDINE GLUCONATE 0.12 % MT SOLN
15.0000 mL | Freq: Two times a day (BID) | OROMUCOSAL | Status: DC
  Administered 2016-12-27 – 2016-12-30 (×8): 15 mL via OROMUCOSAL
  Filled 2016-12-27 (×8): qty 15

## 2016-12-27 MED ORDER — ENOXAPARIN SODIUM 40 MG/0.4ML ~~LOC~~ SOLN
40.0000 mg | SUBCUTANEOUS | Status: DC
  Administered 2016-12-27: 40 mg via SUBCUTANEOUS
  Filled 2016-12-27: qty 0.4

## 2016-12-27 MED ORDER — KCL IN DEXTROSE-NACL 20-5-0.45 MEQ/L-%-% IV SOLN
INTRAVENOUS | Status: DC
  Administered 2016-12-27 (×2): via INTRAVENOUS
  Filled 2016-12-27 (×3): qty 1000

## 2016-12-27 MED ORDER — IOPAMIDOL (ISOVUE-300) INJECTION 61%
100.0000 mL | Freq: Once | INTRAVENOUS | Status: AC | PRN
  Administered 2016-12-27: 100 mL via INTRAVENOUS

## 2016-12-27 MED ORDER — ACETAMINOPHEN 10 MG/ML IV SOLN
1000.0000 mg | Freq: Four times a day (QID) | INTRAVENOUS | Status: AC
  Administered 2016-12-27 – 2016-12-28 (×4): 1000 mg via INTRAVENOUS
  Filled 2016-12-27 (×4): qty 100

## 2016-12-27 MED ORDER — ONDANSETRON 4 MG PO TBDP: 4.0000 mg | ORAL_TABLET | Freq: Four times a day (QID) | ORAL | Status: DC | PRN

## 2016-12-27 MED ORDER — DIPHENHYDRAMINE HCL 25 MG PO CAPS: 25.0000 mg | ORAL_CAPSULE | Freq: Four times a day (QID) | ORAL | Status: DC | PRN

## 2016-12-27 MED ORDER — DIPHENHYDRAMINE HCL 50 MG/ML IJ SOLN: 25.0000 mg | Freq: Four times a day (QID) | INTRAMUSCULAR | Status: DC | PRN

## 2016-12-27 MED ORDER — HYDROMORPHONE HCL 1 MG/ML IJ SOLN
1.0000 mg | Freq: Once | INTRAMUSCULAR | Status: AC
  Administered 2016-12-27: 1 mg via INTRAVENOUS
  Filled 2016-12-27: qty 1

## 2016-12-27 MED ORDER — ONDANSETRON HCL 4 MG/2ML IJ SOLN: 4.0000 mg | Freq: Four times a day (QID) | INTRAMUSCULAR | Status: DC | PRN

## 2016-12-27 NOTE — ED Provider Notes (Signed)
[facility] DEPT Provider Note   CSN: [id number] Arrival date & time: 12/27/16  7782     History   Chief Complaint Chief Complaint  Patient presents with  . Chest Pain  . Abdominal Pain    HPI Cameron Jones is a 51 y.o. male.  Patient presents to the emergency department with complaints of abdominal pain. Patient complaining of a sharp stabbing pain in the center of his upper abdomen that began after eating dinner. He reports that he laid down and the symptoms improve, then came back. Since then the pain is progressively worsened. He reports that the pain is now radiating up into the center of his chest. Patient reports that he did have a history of lap band procedure in 2013. He also has a history of colon cancer status post partial colectomy. Additionally, he has a history of a ventral hernia.      Past Medical History:  Diagnosis Date  . Arthritis    Knee  . Cancer ([place])    colon  . Carpal tunnel syndrome of right wrist   . Fatty liver    per Dr Radford Pax note  . GERD (gastroesophageal reflux disease)   . Hernia   . History of blood transfusion   . Lower back pain   . Obesity   . Sleep apnea    severe per report 12/11  on chart  . Ventral hernia     Patient Active Problem List   Diagnosis Date Noted  . 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 Colon cancer-right T3 N0, Oct 2007 10/06/2011  . Ventral hernia-midline 10/06/2011  . Obstructive sleep apnea of adult 10/06/2011  . Obesity BMI 61 10/06/2011    Past Surgical History:  Procedure Laterality Date  . BREATH TEK H PYLORI  11/07/2011   Procedure: BREATH TEK H PYLORI;  Surgeon: Shann Medal, MD;  Location: [facility];  Service: General;  Laterality: N/A;  . BREATH TEK H PYLORI  01/02/2012   Procedure: BREATH TEK H PYLORI;  Surgeon: Pedro Earls, MD;  Location: [facility];  Service: General;  Laterality: N/A;  . COLON SURGERY  06/2006   colectomy  . COLONOSCOPY N/A 05/13/2013   Procedure:  COLONOSCOPY;  Surgeon: Winfield Cunas., MD;  Location: [facility];  Service: Endoscopy;  Laterality: N/A;  . LAPAROSCOPIC GASTRIC BANDING  05/07/2012   Procedure: LAPAROSCOPIC GASTRIC BANDING;  Surgeon: Pedro Earls, MD;  Location: [facility];  Service: General;  Laterality: N/A;       Home Medications    Prior to Admission medications   Medication Sig Start Date End Date Taking? Authorizing Provider  Multiple Vitamin (MULTIVITAMIN) tablet Take 1 tablet by mouth daily.   Yes Historical Provider, MD  naproxen sodium (ANAPROX) 220 MG tablet Take 220 mg by mouth 2 (two) times daily as needed (pain).    Yes Historical Provider, MD    Family History Family History  Problem Relation Age of Onset  . Cancer Mother     pancreatic  . Cancer Brother     colon  . Alzheimer's disease Father   . Depression Brother   . Cancer Maternal Aunt     breast    Social History Social History  Substance Use Topics  . Smoking status: Never Smoker  . Smokeless tobacco: Never Used  . Alcohol use 0.5 oz/week    1 Standard drinks or equivalent per week     Comment: wine  1 glass evry 2-3 months  Allergies   Patient has no known allergies.   Review of Systems Review of Systems  Cardiovascular: Positive for chest pain.  Gastrointestinal: Positive for abdominal pain.  All other systems reviewed and are negative.    Physical Exam Updated Vital Signs BP 136/88 (BP Location: Right Arm)   Pulse 69   Temp 97.3 F (36.3 C) (Oral)   Resp (!) 24   SpO2 95%   Physical Exam  Constitutional: He is oriented to person, place, and time. He appears well-developed and well-nourished. No distress.  HENT:  Head: Normocephalic and atraumatic.  Right Ear: Hearing normal.  Left Ear: Hearing normal.  Nose: Nose normal.  Mouth/Throat: Oropharynx is clear and moist and mucous membranes are normal.  Eyes: Conjunctivae and EOM are normal. Pupils are equal, round, and reactive to light.  Neck:  Normal range of motion. Neck supple.  Cardiovascular: Regular rhythm, S1 normal and S2 normal.  Exam reveals no gallop and no friction rub.   No murmur heard. Pulmonary/Chest: Effort normal and breath sounds normal. No respiratory distress. He exhibits no tenderness.  Abdominal: Soft. Normal appearance and bowel sounds are normal. There is no hepatosplenomegaly. There is tenderness in the epigastric area. There is no rebound, no guarding, no tenderness at McBurney's point and negative Murphy's sign. No hernia.  Musculoskeletal: Normal range of motion.  Neurological: He is alert and oriented to person, place, and time. He has normal strength. No cranial nerve deficit or sensory deficit. Coordination normal. GCS eye subscore is 4. GCS verbal subscore is 5. GCS motor subscore is 6.  Skin: Skin is warm, dry and intact. No rash noted. No cyanosis.  Psychiatric: He has a normal mood and affect. His speech is normal and behavior is normal. Thought content normal.  Nursing note and vitals reviewed.    ED Treatments / Results  Labs (all labs ordered are listed, but only abnormal results are displayed) Labs Reviewed  COMPREHENSIVE METABOLIC PANEL - Abnormal; Notable for the following:       Result Value   Glucose, Bld 134 (*)    AST 44 (*)    ALT 65 (*)    All other components within normal limits  URINALYSIS, ROUTINE W REFLEX MICROSCOPIC - Abnormal; Notable for the following:    APPearance CLOUDY (*)    Ketones, ur 20 (*)    All other components within normal limits  I-STAT CG4 LACTIC ACID, ED - Abnormal; Notable for the following:    Lactic Acid, Venous 2.54 (*)    All other components within normal limits  LIPASE, BLOOD  CBC  I-STAT TROPOININ, ED    EKG  EKG Interpretation  Date/Time:  Wednesday December 27 2016 03:35:29 EDT Ventricular Rate:  61 PR Interval:    QRS Duration: 96 QT Interval:  391 QTC Calculation: 394 R Axis:   93 Text Interpretation:  Sinus rhythm Borderline right  axis deviation Minimal ST depression, inferior leads No significant change since last tracing Confirmed by POLLINA  MD, CHRISTOPHER [phone]) on 12/27/2016 4:02:49 AM Also confirmed by Betsey Holiday  MD, CHRISTOPHER [id number]), editor Drema Pry [phone])  on 12/27/2016 6:37:43 AM       Radiology Dg Chest 2 View  Result Date: 12/27/2016 CLINICAL DATA:  Sudden onset of upper abdominal pain extending into the mid chest EXAM: CHEST  2 VIEW COMPARISON:  10/23/2011 FINDINGS: The lungs are clear. The pulmonary vasculature is normal. Heart size is normal. Hilar and mediastinal contours are unremarkable. There is no pleural effusion. IMPRESSION:  No active cardiopulmonary disease. Electronically Signed   By: Andreas Newport M.D.   On: 12/27/2016 04:29   Ct Abdomen Pelvis W Contrast  Result Date: 12/27/2016 CLINICAL DATA:  51 y/o M; abdominal pain and vomiting. History of ventral hernia, GERD, and gastric banding in 2013. Colectomy due to colon cancer in 2007. EXAM: CT ABDOMEN AND PELVIS WITH CONTRAST TECHNIQUE: Multidetector CT imaging of the abdomen and pelvis was performed using the standard protocol following bolus administration of intravenous contrast. CONTRAST:  100 cc Isovue-300 COMPARISON:  11/10/2011 CT of abdomen and pelvis. FINDINGS: Lower chest: Small left posterior Bochdalek's hernia containing fat. Hepatobiliary: Hepatic steatosis. Normal gallbladder. No intra or extrahepatic biliary ductal dilatation. Pancreas: Unremarkable. No pancreatic ductal dilatation or surrounding inflammatory changes. Spleen: Normal in size without focal abnormality. Adrenals/Urinary Tract: 25 mm left adrenal adenoma stable from prior CT given differences in technique. Stable left kidney interpolar cyst. Otherwise no focal kidney lesion identified. No hydronephrosis. Normal bladder. Stomach/Bowel: Broad neck midline upper ventral abdominal hernia containing a portion of: Without proximal obstruction or inflammatory changes.  Partial colectomy. There is a smaller second midline ventral hernia just superior to the umbilicus containing a loop of dilated small bowel. Additionally, there is mild dilatation of upstream small bowel. Findings likely represent a component of obstruction. Vascular/Lymphatic: No significant vascular findings are present. No enlarged abdominal or pelvic lymph nodes. Reproductive: Prostate is unremarkable. Other: No abdominal wall hernia or abnormality. No abdominopelvic ascites. Musculoskeletal: Right progression of grade 1 L4-5 anterolisthesis and lumbar spondylosis. No acute osseous abnormality is identified. IMPRESSION: 1. Midline ventral abdominal hernia just superior to the umbilicus containing a dilated loop of small bowel with additional dilated loops of small bowel upstream to the hernia indicating a component of obstruction. 2. Second broad necked ventral abdominal hernia more superiorly containing a portion of colon without obstruction or inflammatory change. 3. Hepatic steatosis. 4. 25 mm stable left adrenal adenoma in comparison with prior CT when measured in a similar fashion. 5. Mild progression of lumbar spondylosis and grade 1 L4-5 anterolisthesis. Electronically Signed   By: Kristine Garbe M.D.   On: 12/27/2016 05:57    Procedures Procedures (including critical care time)  Medications Ordered in ED Medications  ondansetron (ZOFRAN) injection 4 mg (4 mg Intravenous Given 12/27/16 0440)  sodium chloride 0.9 % bolus 1,000 mL (0 mLs Intravenous Stopped 12/27/16 0612)  HYDROmorphone (DILAUDID) injection 1 mg (1 mg Intravenous Given 12/27/16 0440)  iopamidol (ISOVUE-300) 61 % injection 100 mL (100 mLs Intravenous Contrast Given 12/27/16 0533)     Initial Impression / Assessment and Plan / ED Course  I have reviewed the triage vital signs and the nursing notes.  Pertinent labs & imaging results that were available during my care of the patient were reviewed by me and considered in my  medical decision making (see chart for details).     Patient presents to the emergency department for evaluation of abdominal pain. Patient reports previous history of lap band procedure as well as a ventral hernia. Pain is in the area of his ventral hernia, although exam is difficult secondary to his obesity. Patient having some pain up into his chest, but does not appear cardiac. Cardiac evaluation negative. CT scan performed because of his previous surgeries. CT scan does show a loop of small bowel in his ventral hernia with dilated loops concerning for obstruction. Will consult general surgery.  Final Clinical Impressions(s) / ED Diagnoses   Final diagnoses:  Ventral hernia with obstruction  and without gangrene    New Prescriptions New Prescriptions   No medications on file     Orpah Greek, MD 12/27/16 [phone]

## 2016-12-27 NOTE — Consult Note (Deleted)
[facility] Surgery Consult Note  Cameron Jones 1965/12/15  [id number].    Requesting MD: Betsey Holiday, MD  Chief Complaint/Reason for Consult: pSBO, ventral hernia   HPI:  52 y/o male with multiple medical problems who presented to [facility] with a cc of abdominal pain. He is a surgical history of lap band placement (2013) and partial colectomy for colon CA(2007). Patient states his abdominal pain started yesterday and is described as intermittent, sharp, and stabbing. Patient initially attributed his pain to gas pain secondary to eating more meat at dinner time (something he normally doesn't do). He attempted to drink baking soda/water and have a small BM with no relief of symptoms. He presented to the ED early this morning (0300) due to worsening of pain and after his arrival he developed nausea and vomiting. He reports a history of small bowel obstruction secondary to known ventral hernia that has resolved with non-operative management in the past. He denies use of tobacco or EtoH. Denies use of blood thinning medications.  ED workup significant for elevated lactate (2.54) and CT Abd/Pelv w/ ventral hernia containing and dilated loop of small bowel with additional dilatation of more proximal small bowel. There is a second, more proximal, ventral hernia that contains non-obstructed colon. The patient is afebrile and CBC/BMET/UA are unremarkable.  ROS: Review of Systems  Constitutional: Negative for chills, fever and weight loss.  HENT: Negative.   Eyes: Negative.   Respiratory: Negative for cough, sputum production, shortness of breath and wheezing.   Cardiovascular: Negative for chest pain and palpitations.  Gastrointestinal: Positive for abdominal pain, nausea and vomiting.  Genitourinary: Negative.   Musculoskeletal: Negative.   Neurological: Negative.   All other systems reviewed and are negative.   Family History  Problem Relation Age of Onset  . Cancer Mother     pancreatic  .  Cancer Brother     colon  . Alzheimer's disease Father   . Depression Brother   . Cancer Maternal Aunt     breast    Past Medical History:  Diagnosis Date  . Arthritis    Knee  . Cancer ([place])    colon  . Carpal tunnel syndrome of right wrist   . Fatty liver    per Dr Radford Pax note  . GERD (gastroesophageal reflux disease)   . Hernia   . History of blood transfusion   . Lower back pain   . Obesity   . Sleep apnea    severe per report 12/11  on chart  . Ventral hernia     Past Surgical History:  Procedure Laterality Date  . BREATH TEK H PYLORI  11/07/2011   Procedure: BREATH TEK H PYLORI;  Surgeon: Shann Medal, MD;  Location: [facility];  Service: General;  Laterality: N/A;  . BREATH TEK H PYLORI  01/02/2012   Procedure: BREATH TEK H PYLORI;  Surgeon: Pedro Earls, MD;  Location: [facility];  Service: General;  Laterality: N/A;  . COLON SURGERY  06/2006   colectomy  . COLONOSCOPY N/A 05/13/2013   Procedure: COLONOSCOPY;  Surgeon: Winfield Cunas., MD;  Location: [facility];  Service: Endoscopy;  Laterality: N/A;  . LAPAROSCOPIC GASTRIC BANDING  05/07/2012   Procedure: LAPAROSCOPIC GASTRIC BANDING;  Surgeon: Pedro Earls, MD;  Location: [facility];  Service: General;  Laterality: N/A;    Social History:  reports that he has never smoked. He has never used smokeless tobacco. He reports that he drinks about 0.5 oz of alcohol per  week . He reports that he does not use drugs.  Allergies: No Known Allergies   (Not in a hospital admission)  Blood pressure 136/88, pulse 69, temperature 97.3 F (36.3 C), temperature source Oral, resp. rate (!) 24, SpO2 95 %. Physical Exam: Physical Exam  Constitutional: He is oriented to person, place, and time. He appears well-developed and well-nourished. No distress.  Morbidly obese  HENT:  Head: Normocephalic and atraumatic.  Eyes: EOM are normal. Pupils are equal, round, and reactive to light.  Neck: Normal range of motion.  No tracheal deviation present.  Cardiovascular: Normal rate, regular rhythm and normal heart sounds.   Pulmonary/Chest: Effort normal and breath sounds normal. No stridor. No respiratory distress. He has no wheezes. He has no rales.  Abdominal: Soft. He exhibits no distension. There is tenderness (TTP central abdomen superior to umbilicus without guarding or peritonitis. unable to appreciate ventral hernias 2/2 body habitus.). A hernia is present.  Musculoskeletal: Normal range of motion. He exhibits no deformity.  Neurological: He is alert and oriented to person, place, and time.  Skin: Skin is warm and dry. He is not diaphoretic.  Psychiatric: He has a normal mood and affect.    Results for orders placed or performed during the hospital encounter of 12/27/16 (from the past 48 hour(s))  Lipase, blood     Status: None   Collection Time: 12/27/16  4:38 AM  Result Value Ref Range   Lipase 24 11 - 51 U/L  Comprehensive metabolic panel     Status: Abnormal   Collection Time: 12/27/16  4:38 AM  Result Value Ref Range   Sodium 139 135 - 145 mmol/L   Potassium 3.7 3.5 - 5.1 mmol/L   Chloride 101 101 - 111 mmol/L   CO2 28 22 - 32 mmol/L   Glucose, Bld 134 (H) 65 - 99 mg/dL   BUN 14 6 - 20 mg/dL   Creatinine, Ser 1.06 0.61 - 1.24 mg/dL   Calcium 10.1 8.9 - 10.3 mg/dL   Total Protein 8.0 6.5 - 8.1 g/dL   Albumin 4.9 3.5 - 5.0 g/dL   AST 44 (H) 15 - 41 U/L   ALT 65 (H) 17 - 63 U/L   Alkaline Phosphatase 40 38 - 126 U/L   Total Bilirubin 0.9 0.3 - 1.2 mg/dL   GFR calc non Af Amer >60 >60 mL/min   GFR calc Af Amer >60 >60 mL/min    Comment: (NOTE) The eGFR has been calculated using the CKD EPI equation. This calculation has not been validated in all clinical situations. eGFR's persistently <60 mL/min signify possible Chronic Kidney Disease.    Anion gap 10 5 - 15  CBC     Status: None   Collection Time: 12/27/16  4:38 AM  Result Value Ref Range   WBC 6.7 4.0 - 10.5 K/uL   RBC 5.60  4.22 - 5.81 MIL/uL   Hemoglobin 16.2 13.0 - 17.0 g/dL   HCT 48.1 39.0 - 52.0 %   MCV 85.9 78.0 - 100.0 fL   MCH 28.9 26.0 - 34.0 pg   MCHC 33.7 30.0 - 36.0 g/dL   RDW 13.7 11.5 - 15.5 %   Platelets 189 150 - 400 K/uL  I-stat troponin, ED     Status: None   Collection Time: 12/27/16  4:48 AM  Result Value Ref Range   Troponin i, poc 0.01 0.00 - 0.08 ng/mL   Comment 3  Comment: Due to the release kinetics of cTnI, a negative result within the first hours of the onset of symptoms does not rule out myocardial infarction with certainty. If myocardial infarction is still suspected, repeat the test at appropriate intervals.   I-Stat CG4 Lactic Acid, ED     Status: Abnormal   Collection Time: 12/27/16  4:49 AM  Result Value Ref Range   Lactic Acid, Venous 2.54 (HH) 0.5 - 1.9 mmol/L   Comment NOTIFIED PHYSICIAN   Urinalysis, Routine w reflex microscopic     Status: Abnormal   Collection Time: 12/27/16  6:18 AM  Result Value Ref Range   Color, Urine YELLOW YELLOW   APPearance CLOUDY (A) CLEAR   Specific Gravity, Urine 1.016 1.005 - 1.030   pH 8.0 5.0 - 8.0   Glucose, UA NEGATIVE NEGATIVE mg/dL   Hgb urine dipstick NEGATIVE NEGATIVE   Bilirubin Urine NEGATIVE NEGATIVE   Ketones, ur 20 (A) NEGATIVE mg/dL   Protein, ur NEGATIVE NEGATIVE mg/dL   Nitrite NEGATIVE NEGATIVE   Leukocytes, UA NEGATIVE NEGATIVE   Dg Chest 2 View  Result Date: 12/27/2016 CLINICAL DATA:  Sudden onset of upper abdominal pain extending into the mid chest EXAM: CHEST  2 VIEW COMPARISON:  10/23/2011 FINDINGS: The lungs are clear. The pulmonary vasculature is normal. Heart size is normal. Hilar and mediastinal contours are unremarkable. There is no pleural effusion. IMPRESSION: No active cardiopulmonary disease. Electronically Signed   By: Andreas Newport M.D.   On: 12/27/2016 04:29   Ct Abdomen Pelvis W Contrast  Result Date: 12/27/2016 CLINICAL DATA:  51 y/o M; abdominal pain and vomiting. History of  ventral hernia, GERD, and gastric banding in 2013. Colectomy due to colon cancer in 2007. EXAM: CT ABDOMEN AND PELVIS WITH CONTRAST TECHNIQUE: Multidetector CT imaging of the abdomen and pelvis was performed using the standard protocol following bolus administration of intravenous contrast. CONTRAST:  100 cc Isovue-300 COMPARISON:  11/10/2011 CT of abdomen and pelvis. FINDINGS: Lower chest: Small left posterior Bochdalek's hernia containing fat. Hepatobiliary: Hepatic steatosis. Normal gallbladder. No intra or extrahepatic biliary ductal dilatation. Pancreas: Unremarkable. No pancreatic ductal dilatation or surrounding inflammatory changes. Spleen: Normal in size without focal abnormality. Adrenals/Urinary Tract: 25 mm left adrenal adenoma stable from prior CT given differences in technique. Stable left kidney interpolar cyst. Otherwise no focal kidney lesion identified. No hydronephrosis. Normal bladder. Stomach/Bowel: Broad neck midline upper ventral abdominal hernia containing a portion of: Without proximal obstruction or inflammatory changes. Partial colectomy. There is a smaller second midline ventral hernia just superior to the umbilicus containing a loop of dilated small bowel. Additionally, there is mild dilatation of upstream small bowel. Findings likely represent a component of obstruction. Vascular/Lymphatic: No significant vascular findings are present. No enlarged abdominal or pelvic lymph nodes. Reproductive: Prostate is unremarkable. Other: No abdominal wall hernia or abnormality. No abdominopelvic ascites. Musculoskeletal: Right progression of grade 1 L4-5 anterolisthesis and lumbar spondylosis. No acute osseous abnormality is identified. IMPRESSION: 1. Midline ventral abdominal hernia just superior to the umbilicus containing a dilated loop of small bowel with additional dilated loops of small bowel upstream to the hernia indicating a component of obstruction. 2. Second broad necked ventral  abdominal hernia more superiorly containing a portion of colon without obstruction or inflammatory change. 3. Hepatic steatosis. 4. 25 mm stable left adrenal adenoma in comparison with prior CT when measured in a similar fashion. 5. Mild progression of lumbar spondylosis and grade 1 L4-5 anterolisthesis. Electronically Signed   By: Mia Creek  [place]-Stratton M.D.   On: 12/27/2016 05:57   Assessment/Plan pSBO Ventral hernia x2 - admit to [facility] for medical management of pSBO - NG decompressing, bowel rest, IVF  - repeat lactate to further r/o bowel strangulation   - poor surgical candidate given body habitus and high likelihood for recurrent ventral hernia   GERD Morbid obesity OSA Arthritis  Hx Colon Cancer s/p partial colectomy/colostomy 2007    Jill Alexanders, [facility] Surgery 12/27/2016, 9:06 AM Pager: [phone] Consults: [phone] Mon-Fri 7:00 am-4:30 pm Sat-Sun 7:00 am-11:30 am

## 2016-12-27 NOTE — ED Triage Notes (Signed)
Pt complains of abdominal pain that radiates to his chest and down his arm since earlier today, no vomiting or diarrhea

## 2016-12-27 NOTE — ED Notes (Signed)
WILL TRANSPORT PT TO 5W 1527-1. AAOX4. PT IN NO APPARENT DISTRESS. NG-TUBE IN PLACE TO THE LEFT NARE.THE OPPORTUNITY TO ASK QUESTIONS WAS PROVIDED.

## 2016-12-27 NOTE — H&P (Signed)
[facility] Admission Note  Cameron Jones [birth year]  [id number].    Requesting MD: Betsey Holiday, MD  Chief Complaint/Reason for Consult: pSBO, ventral hernia   HPI:  51 y/o male with multiple medical problems who presented to [facility] with a cc of abdominal pain. He is a surgical history of lap band placement (2013) and partial colectomy for colon CA(2007). Patient states his abdominal pain started yesterday and is described as intermittent, sharp, and stabbing. Patient initially attributed his pain to gas pain secondary to eating more meat at dinner time (something he normally doesn't do). He attempted to drink baking soda/water and have a small BM with no relief of symptoms. He presented to the ED early this morning (0300) due to worsening of pain and after his arrival he developed nausea and vomiting. He denies flatus since 0300. Last BM was small BM yesterday. He reports a history of small bowel obstruction secondary to known ventral hernia that has resolved with non-operative management in the past. He denies use of tobacco or EtoH. Denies use of blood thinning medications.  ED workup significant for elevated lactate (2.54) and CT Abd/Pelv w/ ventral hernia containing and dilated loop of small bowel with additional dilatation of more proximal small bowel. There is a second, more proximal, ventral hernia that contains non-obstructed colon. The patient is afebrile and CBC/BMET/UA are unremarkable.  ROS: Review of Systems  Constitutional: Negative for chills, fever and weight loss.  HENT: Negative.   Eyes: Negative.   Respiratory: Negative for cough, sputum production, shortness of breath and wheezing.   Cardiovascular: Negative for chest pain and palpitations.  Gastrointestinal: Positive for abdominal pain, nausea and vomiting.  Genitourinary: Negative.   Musculoskeletal: Negative.   Neurological: Negative.   All other systems reviewed and are negative.   Family History  Problem  Relation Age of Onset  . Cancer Mother     pancreatic  . Cancer Brother     colon  . Alzheimer's disease Father   . Depression Brother   . Cancer Maternal Aunt     breast    Past Medical History:  Diagnosis Date  . Arthritis    Knee  . Cancer ([place])    colon  . Carpal tunnel syndrome of right wrist   . Fatty liver    per Dr Radford Pax note  . GERD (gastroesophageal reflux disease)   . Hernia   . History of blood transfusion   . Lower back pain   . Obesity   . Sleep apnea    severe per report 12/11  on chart  . Ventral hernia     Past Surgical History:  Procedure Laterality Date  . BREATH TEK H PYLORI  11/07/2011   Procedure: BREATH TEK H PYLORI;  Surgeon: Shann Medal, MD;  Location: [facility];  Service: General;  Laterality: N/A;  . BREATH TEK H PYLORI  01/02/2012   Procedure: BREATH TEK H PYLORI;  Surgeon: Pedro Earls, MD;  Location: [facility];  Service: General;  Laterality: N/A;  . COLON SURGERY  06/2006   colectomy  . COLONOSCOPY N/A 05/13/2013   Procedure: COLONOSCOPY;  Surgeon: Winfield Cunas., MD;  Location: [facility];  Service: Endoscopy;  Laterality: N/A;  . LAPAROSCOPIC GASTRIC BANDING  05/07/2012   Procedure: LAPAROSCOPIC GASTRIC BANDING;  Surgeon: Pedro Earls, MD;  Location: [facility];  Service: General;  Laterality: N/A;    Social History:  reports that he has never smoked. He has never used smokeless tobacco.  He reports that he drinks about 0.5 oz of alcohol per week . He reports that he does not use drugs.  Allergies: No Known Allergies   (Not in a hospital admission)  Blood pressure 136/88, pulse 69, temperature 97.3 F (36.3 C), temperature source Oral, resp. rate (!) 24, SpO2 95 %. Physical Exam: Physical Exam  Constitutional: He is oriented to person, place, and time. He appears well-developed and well-nourished. No distress.  Morbidly obese  HENT:  Head: Normocephalic and atraumatic.  Eyes: EOM are normal. Pupils are equal,  round, and reactive to light.  Neck: Normal range of motion. No tracheal deviation present.  Cardiovascular: Normal rate, regular rhythm and normal heart sounds.   Pulmonary/Chest: Effort normal and breath sounds normal. No stridor. No respiratory distress. He has no wheezes. He has no rales.  Abdominal: Soft. He exhibits no distension. There is tenderness (TTP central abdomen superior to umbilicus without guarding or peritonitis. unable to appreciate ventral hernias 2/2 body habitus.). A hernia is present.  Musculoskeletal: Normal range of motion. He exhibits no deformity.  Neurological: He is alert and oriented to person, place, and time.  Skin: Skin is warm and dry. He is not diaphoretic.  Psychiatric: He has a normal mood and affect.    Results for orders placed or performed during the hospital encounter of 12/27/16 (from the past 48 hour(s))  Lipase, blood     Status: None   Collection Time: 12/27/16  4:38 AM  Result Value Ref Range   Lipase 24 11 - 51 U/L  Comprehensive metabolic panel     Status: Abnormal   Collection Time: 12/27/16  4:38 AM  Result Value Ref Range   Sodium 139 135 - 145 mmol/L   Potassium 3.7 3.5 - 5.1 mmol/L   Chloride 101 101 - 111 mmol/L   CO2 28 22 - 32 mmol/L   Glucose, Bld 134 (H) 65 - 99 mg/dL   BUN 14 6 - 20 mg/dL   Creatinine, Ser 1.06 0.61 - 1.24 mg/dL   Calcium 10.1 8.9 - 10.3 mg/dL   Total Protein 8.0 6.5 - 8.1 g/dL   Albumin 4.9 3.5 - 5.0 g/dL   AST 44 (H) 15 - 41 U/L   ALT 65 (H) 17 - 63 U/L   Alkaline Phosphatase 40 38 - 126 U/L   Total Bilirubin 0.9 0.3 - 1.2 mg/dL   GFR calc non Af Amer >60 >60 mL/min   GFR calc Af Amer >60 >60 mL/min    Comment: (NOTE) The eGFR has been calculated using the CKD EPI equation. This calculation has not been validated in all clinical situations. eGFR's persistently <60 mL/min signify possible Chronic Kidney Disease.    Anion gap 10 5 - 15  CBC     Status: None   Collection Time: 12/27/16  4:38 AM   Result Value Ref Range   WBC 6.7 4.0 - 10.5 K/uL   RBC 5.60 4.22 - 5.81 MIL/uL   Hemoglobin 16.2 13.0 - 17.0 g/dL   HCT 48.1 39.0 - 52.0 %   MCV 85.9 78.0 - 100.0 fL   MCH 28.9 26.0 - 34.0 pg   MCHC 33.7 30.0 - 36.0 g/dL   RDW 13.7 11.5 - 15.5 %   Platelets 189 150 - 400 K/uL  I-stat troponin, ED     Status: None   Collection Time: 12/27/16  4:48 AM  Result Value Ref Range   Troponin i, poc 0.01 0.00 - 0.08 ng/mL   Comment  3            Comment: Due to the release kinetics of cTnI, a negative result within the first hours of the onset of symptoms does not rule out myocardial infarction with certainty. If myocardial infarction is still suspected, repeat the test at appropriate intervals.   I-Stat CG4 Lactic Acid, ED     Status: Abnormal   Collection Time: 12/27/16  4:49 AM  Result Value Ref Range   Lactic Acid, Venous 2.54 (HH) 0.5 - 1.9 mmol/L   Comment NOTIFIED PHYSICIAN   Urinalysis, Routine w reflex microscopic     Status: Abnormal   Collection Time: 12/27/16  6:18 AM  Result Value Ref Range   Color, Urine YELLOW YELLOW   APPearance CLOUDY (A) CLEAR   Specific Gravity, Urine 1.016 1.005 - 1.030   pH 8.0 5.0 - 8.0   Glucose, UA NEGATIVE NEGATIVE mg/dL   Hgb urine dipstick NEGATIVE NEGATIVE   Bilirubin Urine NEGATIVE NEGATIVE   Ketones, ur 20 (A) NEGATIVE mg/dL   Protein, ur NEGATIVE NEGATIVE mg/dL   Nitrite NEGATIVE NEGATIVE   Leukocytes, UA NEGATIVE NEGATIVE   Dg Chest 2 View  Result Date: 12/27/2016 CLINICAL DATA:  Sudden onset of upper abdominal pain extending into the mid chest EXAM: CHEST  2 VIEW COMPARISON:  10/23/2011 FINDINGS: The lungs are clear. The pulmonary vasculature is normal. Heart size is normal. Hilar and mediastinal contours are unremarkable. There is no pleural effusion. IMPRESSION: No active cardiopulmonary disease. Electronically Signed   By: Andreas Newport M.D.   On: 12/27/2016 04:29   Ct Abdomen Pelvis W Contrast  Result Date:  12/27/2016 CLINICAL DATA:  51 y/o M; abdominal pain and vomiting. History of ventral hernia, GERD, and gastric banding in 2013. Colectomy due to colon cancer in 2007. EXAM: CT ABDOMEN AND PELVIS WITH CONTRAST TECHNIQUE: Multidetector CT imaging of the abdomen and pelvis was performed using the standard protocol following bolus administration of intravenous contrast. CONTRAST:  100 cc Isovue-300 COMPARISON:  11/10/2011 CT of abdomen and pelvis. FINDINGS: Lower chest: Small left posterior Bochdalek's hernia containing fat. Hepatobiliary: Hepatic steatosis. Normal gallbladder. No intra or extrahepatic biliary ductal dilatation. Pancreas: Unremarkable. No pancreatic ductal dilatation or surrounding inflammatory changes. Spleen: Normal in size without focal abnormality. Adrenals/Urinary Tract: 25 mm left adrenal adenoma stable from prior CT given differences in technique. Stable left kidney interpolar cyst. Otherwise no focal kidney lesion identified. No hydronephrosis. Normal bladder. Stomach/Bowel: Broad neck midline upper ventral abdominal hernia containing a portion of: Without proximal obstruction or inflammatory changes. Partial colectomy. There is a smaller second midline ventral hernia just superior to the umbilicus containing a loop of dilated small bowel. Additionally, there is mild dilatation of upstream small bowel. Findings likely represent a component of obstruction. Vascular/Lymphatic: No significant vascular findings are present. No enlarged abdominal or pelvic lymph nodes. Reproductive: Prostate is unremarkable. Other: No abdominal wall hernia or abnormality. No abdominopelvic ascites. Musculoskeletal: Right progression of grade 1 L4-5 anterolisthesis and lumbar spondylosis. No acute osseous abnormality is identified. IMPRESSION: 1. Midline ventral abdominal hernia just superior to the umbilicus containing a dilated loop of small bowel with additional dilated loops of small bowel upstream to the hernia  indicating a component of obstruction. 2. Second broad necked ventral abdominal hernia more superiorly containing a portion of colon without obstruction or inflammatory change. 3. Hepatic steatosis. 4. 25 mm stable left adrenal adenoma in comparison with prior CT when measured in a similar fashion. 5. Mild progression of lumbar  spondylosis and grade 1 L4-5 anterolisthesis. Electronically Signed   By: Kristine Garbe M.D.   On: 12/27/2016 05:57   Assessment/Plan pSBO Ventral hernia x2 - admit to for medical management of pSBO - NG decompression, bowel rest, IVF  - repeat lactate to further r/o bowel strangulation   - poor surgical candidate given body habitus and high likelihood for recurrent ventral hernia   GERD Morbid obesity OSA Arthritis  Hx Colon Cancer s/p partial colectomy/colostomy 2007    Jill Alexanders, [facility] 12/27/2016, 9:06 AM Pager: [phone] Consults: [phone] Mon-Fri 7:00 am-4:30 pm Sat-Sun 7:00 am-11:30 am

## 2016-12-27 NOTE — ED Notes (Signed)
Pt vomited in triage

## 2016-12-28 ENCOUNTER — Observation Stay (HOSPITAL_COMMUNITY): Payer: Managed Care, Other (non HMO)

## 2016-12-28 DIAGNOSIS — G4733 Obstructive sleep apnea (adult) (pediatric): Secondary | ICD-10-CM | POA: Diagnosis present

## 2016-12-28 DIAGNOSIS — K56600 Partial intestinal obstruction, unspecified as to cause: Secondary | ICD-10-CM | POA: Diagnosis present

## 2016-12-28 DIAGNOSIS — Z9049 Acquired absence of other specified parts of digestive tract: Secondary | ICD-10-CM | POA: Diagnosis not present

## 2016-12-28 DIAGNOSIS — Z820 Family history of epilepsy and other diseases of the nervous system: Secondary | ICD-10-CM | POA: Diagnosis not present

## 2016-12-28 DIAGNOSIS — Z809 Family history of malignant neoplasm, unspecified: Secondary | ICD-10-CM | POA: Diagnosis not present

## 2016-12-28 DIAGNOSIS — K4090 Unilateral inguinal hernia, without obstruction or gangrene, not specified as recurrent: Secondary | ICD-10-CM | POA: Diagnosis present

## 2016-12-28 DIAGNOSIS — K219 Gastro-esophageal reflux disease without esophagitis: Secondary | ICD-10-CM | POA: Diagnosis present

## 2016-12-28 DIAGNOSIS — Z79899 Other long term (current) drug therapy: Secondary | ICD-10-CM | POA: Diagnosis not present

## 2016-12-28 DIAGNOSIS — Z85038 Personal history of other malignant neoplasm of large intestine: Secondary | ICD-10-CM | POA: Diagnosis not present

## 2016-12-28 DIAGNOSIS — Z9884 Bariatric surgery status: Secondary | ICD-10-CM | POA: Diagnosis not present

## 2016-12-28 DIAGNOSIS — M1990 Unspecified osteoarthritis, unspecified site: Secondary | ICD-10-CM | POA: Diagnosis present

## 2016-12-28 DIAGNOSIS — Z818 Family history of other mental and behavioral disorders: Secondary | ICD-10-CM | POA: Diagnosis not present

## 2016-12-28 DIAGNOSIS — Z933 Colostomy status: Secondary | ICD-10-CM | POA: Diagnosis not present

## 2016-12-28 DIAGNOSIS — Z6841 Body Mass Index (BMI) 40.0 and over, adult: Secondary | ICD-10-CM | POA: Diagnosis not present

## 2016-12-28 DIAGNOSIS — K432 Incisional hernia without obstruction or gangrene: Secondary | ICD-10-CM | POA: Diagnosis present

## 2016-12-28 DIAGNOSIS — Z0189 Encounter for other specified special examinations: Secondary | ICD-10-CM | POA: Diagnosis present

## 2016-12-28 LAB — HIV ANTIBODY (ROUTINE TESTING W REFLEX): HIV Screen 4th Generation wRfx: NONREACTIVE

## 2016-12-28 LAB — BASIC METABOLIC PANEL
ANION GAP: 6 (ref 5–15)
BUN: 7 mg/dL (ref 6–20)
CHLORIDE: 105 mmol/L (ref 101–111)
CO2: 27 mmol/L (ref 22–32)
Calcium: 8.9 mg/dL (ref 8.9–10.3)
Creatinine, Ser: 1.04 mg/dL (ref 0.61–1.24)
GFR calc non Af Amer: >60 mL/min (ref >60)
GLUCOSE: 111 mg/dL — AB (ref 65–99)
Potassium: 3.5 mmol/L (ref 3.5–5.1)
Sodium: 138 mmol/L (ref 135–145)

## 2016-12-28 LAB — CBC
HCT: 44.2 % (ref 39.0–52.0)
HEMOGLOBIN: 14.7 g/dL (ref 13.0–17.0)
MCH: 28.9 pg (ref 26.0–34.0)
MCHC: 33.3 g/dL (ref 30.0–36.0)
MCV: 86.8 fL (ref 78.0–100.0)
Platelets: 196 10*3/uL (ref 150–400)
RBC: 5.09 MIL/uL (ref 4.22–5.81)
RDW: 13.8 % (ref 11.5–15.5)
WBC: 5.9 10*3/uL (ref 4.0–10.5)

## 2016-12-28 LAB — MAGNESIUM: Magnesium: 1.8 mg/dL (ref 1.7–2.4)

## 2016-12-28 MED ORDER — KCL IN DEXTROSE-NACL 40-5-0.9 MEQ/L-%-% IV SOLN
INTRAVENOUS | Status: DC
  Administered 2016-12-28 – 2016-12-31 (×6): via INTRAVENOUS
  Filled 2016-12-28 (×8): qty 1000

## 2016-12-28 MED ORDER — ENOXAPARIN SODIUM 100 MG/ML ~~LOC~~ SOLN
100.0000 mg | SUBCUTANEOUS | Status: DC
  Administered 2016-12-28 – 2016-12-31 (×4): 100 mg via SUBCUTANEOUS
  Filled 2016-12-28 (×5): qty 1

## 2016-12-28 NOTE — Progress Notes (Signed)
Subjective: Chief complaint: Partial bowel obstruction/ventral hernia with abdominal pain, with abdominal pain nausea and vomiting.  He's very comfortable this morning. Abdominal pain has resolved. NG shows some drainage but not a great deal. 100 is recorded but he has about three quarters of the canister filled. I replaced the filter. No films so far I will order one now.  Objective: Vital signs in last 24 hours: Temp:  [98.1 F (36.7 C)-98.9 F (37.2 C)] 98.7 F (37.1 C) (04/05 0800) Pulse Rate:  [58-77] 77 (04/05 0800) Resp:  [14-17] 15 (04/05 0800) BP: (118-147)/(51-75) 141/59 (04/05 0800) SpO2:  [96 %-100 %] 100 % (04/05 0800) Weight:  [194.1 kg (428 lb)] 194.1 kg (428 lb) (04/04 1227) Last BM Date: 12/27/16 Nothing by mouth 1571 IV 1500 urine Afebrile vital signs are stable. Potassium is 3.5 remainder of BMP is normal. WBC is 5.9, H/H down to 14.7/44 with hydration. Platelets up at 4 96,000. Objective he is negative Single view film yesterday showed the NG tube and the tip of the antrum. Intake/Output from previous day: 04/04 0701 - 04/05 0700 In: 1571.7 [I.V.:1571.7] Out: 1650 [Urine:1500; Emesis/NG output:100] Intake/Output this shift: Total I/O In: -  Out: 1000 [Urine:1000]  General appearance: alert, cooperative and no distress Resp: clear to auscultation bilaterally GI: Soft, nontender. He does not appear distended, but has a very large abdomen. Few bowel sounds and some flatus.  Lab Results:   Recent Labs  12/27/16 0438 12/28/16 0432  WBC 6.7 5.9  HGB 16.2 14.7  HCT 48.1 44.2  PLT 189 196    BMET  Recent Labs  12/27/16 0438 12/28/16 0432  NA 139 138  K 3.7 3.5  CL 101 105  CO2 28 27  GLUCOSE 134* 111*  BUN 14 7  CREATININE 1.06 1.04  CALCIUM 10.1 8.9   PT/INR No results for input(s): LABPROT, INR in the last 72 hours.   Recent Labs Lab 12/27/16 0438  AST 44*  ALT 65*  ALKPHOS 40  BILITOT 0.9  PROT 8.0  ALBUMIN 4.9      Lipase     Component Value Date/Time   LIPASE 24 12/27/2016 0438     Studies/Results: Dg Chest 2 View  Result Date: 12/27/2016 CLINICAL DATA:  Sudden onset of upper abdominal pain extending into the mid chest EXAM: CHEST  2 VIEW COMPARISON:  10/23/2011 FINDINGS: The lungs are clear. The pulmonary vasculature is normal. Heart size is normal. Hilar and mediastinal contours are unremarkable. There is no pleural effusion. IMPRESSION: No active cardiopulmonary disease. Electronically Signed   By: Andreas Newport M.D.   On: 12/27/2016 04:29   Dg Abd 1 View  Result Date: 12/27/2016 CLINICAL DATA:  Nasogastric placement EXAM: ABDOMEN - 1 VIEW COMPARISON:  CT same day FINDINGS: Nasogastric tube enters the stomach with its tip in the antrum. Normal appearance of the lap band. IMPRESSION: Nasogastric tip in the antrum. Electronically Signed   By: Nelson Chimes M.D.   On: 12/27/2016 11:30   Ct Abdomen Pelvis W Contrast  Result Date: 12/27/2016 CLINICAL DATA:  51 y/o M; abdominal pain and vomiting. History of ventral hernia, GERD, and gastric banding in 2013. Colectomy due to colon cancer in 2007. EXAM: CT ABDOMEN AND PELVIS WITH CONTRAST TECHNIQUE: Multidetector CT imaging of the abdomen and pelvis was performed using the standard protocol following bolus administration of intravenous contrast. CONTRAST:  100 cc Isovue-300 COMPARISON:  11/10/2011 CT of abdomen and pelvis. FINDINGS: Lower chest: Small left posterior Bochdalek's hernia containing  fat. Hepatobiliary: Hepatic steatosis. Normal gallbladder. No intra or extrahepatic biliary ductal dilatation. Pancreas: Unremarkable. No pancreatic ductal dilatation or surrounding inflammatory changes. Spleen: Normal in size without focal abnormality. Adrenals/Urinary Tract: 25 mm left adrenal adenoma stable from prior CT given differences in technique. Stable left kidney interpolar cyst. Otherwise no focal kidney lesion identified. No hydronephrosis. Normal bladder.  Stomach/Bowel: Broad neck midline upper ventral abdominal hernia containing a portion of: Without proximal obstruction or inflammatory changes. Partial colectomy. There is a smaller second midline ventral hernia just superior to the umbilicus containing a loop of dilated small bowel. Additionally, there is mild dilatation of upstream small bowel. Findings likely represent a component of obstruction. Vascular/Lymphatic: No significant vascular findings are present. No enlarged abdominal or pelvic lymph nodes. Reproductive: Prostate is unremarkable. Other: No abdominal wall hernia or abnormality. No abdominopelvic ascites. Musculoskeletal: Right progression of grade 1 L4-5 anterolisthesis and lumbar spondylosis. No acute osseous abnormality is identified. IMPRESSION: 1. Midline ventral abdominal hernia just superior to the umbilicus containing a dilated loop of small bowel with additional dilated loops of small bowel upstream to the hernia indicating a component of obstruction. 2. Second broad necked ventral abdominal hernia more superiorly containing a portion of colon without obstruction or inflammatory change. 3. Hepatic steatosis. 4. 25 mm stable left adrenal adenoma in comparison with prior CT when measured in a similar fashion. 5. Mild progression of lumbar spondylosis and grade 1 L4-5 anterolisthesis. Electronically Signed   By: Kristine Garbe M.D.   On: 12/27/2016 05:57    Medications: . acetaminophen  1,000 mg Intravenous Q6H  . chlorhexidine  15 mL Mouth Rinse BID  . enoxaparin (LOVENOX) injection  100 mg Subcutaneous Q24H  . mouth rinse  15 mL Mouth Rinse q12n4p  . pantoprazole (PROTONIX) IV  40 mg Intravenous QHS   . dextrose 5 % and 0.45 % NaCl with KCl 20 mEq/L 100 mL/hr at 12/27/16 2252    Assessment/Plan Partial small bowel obstruction/ventral hernia History of colon cancer with partial colectomy/colostomy 2007 history of gastric banding 2013 GERD Morbid obesity Body mass  index is 56.47 kg/m. Obstructive sleep apnea Arthritis FEN: IV fluids/nothing by mouth =>> ice chips ID: No antibiotics DVT: Lovenox    Plan: 2 view film now. Ambulate in the halls. If his films okay start him on clears/ice chips. Possible NG clamping trials.  Check magnesium and replace potassium through IV fluids.   Earnstine Regal 12/28/2016 [phone]

## 2016-12-29 ENCOUNTER — Inpatient Hospital Stay (HOSPITAL_COMMUNITY): Payer: Managed Care, Other (non HMO)

## 2016-12-29 NOTE — Progress Notes (Signed)
[facility] Progress Note     Subjective: CC: throat discomfort from nasal drainage. Denies abdominal pain but endorses abdominal fullness/gas in his upper abdomen. Reports 2 episodes of flatus this AM. Denies BM. Plans to get up and go for a walk this AM.   Objective: Vital signs in last 24 hours: Temp:  [98 F (36.7 C)-99.5 F (37.5 C)] 99.5 F (37.5 C) (04/06 0546) Pulse Rate:  [61-86] 66 (04/06 0546) Resp:  [15-16] 16 (04/06 0546) BP: (133-146)/(65-74) 146/74 (04/06 0546) SpO2:  [98 %] 98 % (04/06 0546) Last BM Date: 12/26/16 (per pt)  Intake/Output from previous day: 04/05 0701 - 04/06 0700 In: 2500 [I.V.:2400; IV Piggyback:100] Out: 4300 [Urine:3500; Emesis/NG output:800] Intake/Output this shift: No intake/output data recorded.  PE: Gen:  Alert, NAD, pleasant Card:  Regular rate and rhythm, pedal pulses 2+ bilaterally  Pulm:  Normal respiratory effort, clear to auscultation bilaterally Abd: Soft, non-tender, non-distended, bowel sounds present in all 4 quadrants, no HSM, previous laparotomy incisions, palpable fullness at proximal-most aspect of abdominal scar.  NGT: 800 cc/24h Ext:  No erythema, edema, or tenderness   Lab Results:   Recent Labs  12/27/16 0438 12/28/16 0432  WBC 6.7 5.9  HGB 16.2 14.7  HCT 48.1 44.2  PLT 189 196   BMET  Recent Labs  12/27/16 0438 12/28/16 0432  NA 139 138  K 3.7 3.5  CL 101 105  CO2 28 27  GLUCOSE 134* 111*  BUN 14 7  CREATININE 1.06 1.04  CALCIUM 10.1 8.9   PT/INR No results for input(s): LABPROT, INR in the last 72 hours. CMP     Component Value Date/Time   NA 138 12/28/2016 0432   K 3.5 12/28/2016 0432   CL 105 12/28/2016 0432   CO2 27 12/28/2016 0432   GLUCOSE 111 (H) 12/28/2016 0432   BUN 7 12/28/2016 0432   CREATININE 1.04 12/28/2016 0432   CALCIUM 8.9 12/28/2016 0432   PROT 8.0 12/27/2016 0438   ALBUMIN 4.9 12/27/2016 0438   AST 44 (H) 12/27/2016 0438   ALT 65 (H) 12/27/2016 0438    ALKPHOS 40 12/27/2016 0438   BILITOT 0.9 12/27/2016 0438   GFRNONAA >60 12/28/2016 0432   GFRAA >60 12/28/2016 0432   Lipase     Component Value Date/Time   LIPASE 24 12/27/2016 0438   Studies/Results: Dg Abd 1 View  Result Date: 12/27/2016 CLINICAL DATA:  Nasogastric placement EXAM: ABDOMEN - 1 VIEW COMPARISON:  CT same day FINDINGS: Nasogastric tube enters the stomach with its tip in the antrum. Normal appearance of the lap band. IMPRESSION: Nasogastric tip in the antrum. Electronically Signed   By: Nelson Chimes M.D.   On: 12/27/2016 11:30   Dg Abd 2 Views  Result Date: 12/28/2016 CLINICAL DATA:  Follow-up small bowel obstruction EXAM: ABDOMEN - 2 VIEW COMPARISON:  12/27/2016 FINDINGS: NG tube in place with tip in distal stomach. Again noted lap band in proximal gastric region. Persistent mild segmental gaseous distension of small bowel loop in mid lower abdomen probable ileus or partial bowel obstruction. Some colonic gas and stool noted within distal colon. No evidence of free abdominal air. IMPRESSION: Persistent mild segmental gaseous distension of small bowel loop in mid lower abdomen probable ileus or partial bowel obstruction. Some colonic gas and stool noted within distal colon. No evidence of free abdominal air. Electronically Signed   By: Lahoma Crocker M.D.   On: 12/28/2016 11:06   Anti-infectives: Anti-infectives    None  Assessment/Plan Partial small bowel obstruction/ventral hernia x2 - NGT in place, high output - having small amounts of flatus  History of colon cancer with partial colectomy/colostomy 2007 history of gastric banding 2013 GERD Morbid obesity Body mass index is 56.47 kg/m. Obstructive sleep apnea Arthritis FEN: IV fluids/nothing by mouth =>> ice chips ID: No antibiotics DVT: Lovenox    Plan: continue NGT to intermittent wall suction, repeat AXR  May consider clamping/removing NGT this afternoon vs tomorrow morning if patient shows increasing  signs of bowel function.    LOS: 1 day    [facility] 12/29/2016, 9:37 AM Pager: [phone] Consults: [phone] Mon-Fri 7:00 am-4:30 pm Sat-Sun 7:00 am-11:30 am

## 2016-12-30 ENCOUNTER — Inpatient Hospital Stay (HOSPITAL_COMMUNITY): Payer: Managed Care, Other (non HMO)

## 2016-12-30 NOTE — Progress Notes (Signed)
  Subjective: Pt with BM x 2 overnight NGT removed   Objective: Vital signs in last 24 hours: Temp:  [98.3 F (36.8 C)-99.2 F (37.3 C)] 98.3 F (36.8 C) (04/07 0520) Pulse Rate:  [81-98] 81 (04/07 0520) Resp:  [16-20] 20 (04/07 0520) BP: (129-140)/(69-77) 129/77 (04/07 0520) SpO2:  [96 %-100 %] 96 % (04/07 0520) Last BM Date: 12/29/16  Intake/Output from previous day: 04/06 0701 - 04/07 0700 In: 6077.7 [I.V.:6077.7] Out: 2120 [Urine:1120; Emesis/NG output:1000] Intake/Output this shift: No intake/output data recorded.  General appearance: alert and cooperative GI: soft, non-tender; bowel sounds normal; no masses,  no organomegaly and palpable VH  Lab Results:   Recent Labs  12/28/16 0432  WBC 5.9  HGB 14.7  HCT 44.2  PLT 196   BMET  Recent Labs  12/28/16 0432  NA 138  K 3.5  CL 105  CO2 27  GLUCOSE 111*  BUN 7  CREATININE 1.04  CALCIUM 8.9   PT/INR No results for input(s): LABPROT, INR in the last 72 hours. ABG No results for input(s): PHART, HCO3 in the last 72 hours.  Invalid input(s): PCO2, PO2  Studies/Results: Dg Abd 1 View  Result Date: 12/29/2016 CLINICAL DATA:  Followup small bowel obstruction. EXAM: ABDOMEN - 1 VIEW COMPARISON:  12/28/2016.  12/27/2016. FINDINGS: Nasogastric tube tip in the antrum. Lap band projects over the abdomen. Bowel gas pattern is normal without evidence of ileus or obstruction presently. Hernia cannot be appreciated on these frontal views. No abnormal calcifications. Ordinary degenerative changes affect the lumbar spine. IMPRESSION: Gas pattern now normal. Hernia cannot be appreciated on the frontal views. Electronically Signed   By: Nelson Chimes M.D.   On: 12/29/2016 12:22   Dg Abd 2 Views  Result Date: 12/28/2016 CLINICAL DATA:  Follow-up small bowel obstruction EXAM: ABDOMEN - 2 VIEW COMPARISON:  12/27/2016 FINDINGS: NG tube in place with tip in distal stomach. Again noted lap band in proximal gastric region.  Persistent mild segmental gaseous distension of small bowel loop in mid lower abdomen probable ileus or partial bowel obstruction. Some colonic gas and stool noted within distal colon. No evidence of free abdominal air. IMPRESSION: Persistent mild segmental gaseous distension of small bowel loop in mid lower abdomen probable ileus or partial bowel obstruction. Some colonic gas and stool noted within distal colon. No evidence of free abdominal air. Electronically Signed   By: Lahoma Crocker M.D.   On: 12/28/2016 11:06     Assessment/Plan: Partial small bowel obstruction/ventral hernia x2 History of colon cancer with partial colectomy/colostomy 2007 history of gastric banding 2013 GERD Morbid obesity Body mass index is 56.47 kg/m. Obstructive sleep apnea Arthritis  1. Adv diet as tol 2. Mobilize 3. Hopefully home tomorrow if can tol PO    LOS: 2 days    Rosario Jacks., Anne Hahn 12/30/2016

## 2016-12-30 NOTE — Progress Notes (Signed)
Attempted to place 22 gauge IV to [place] without success. Pt tolerated well. Dressing applied and is c/d/I.

## 2016-12-31 LAB — CREATININE, SERUM
CREATININE: 0.97 mg/dL (ref 0.61–1.24)
GFR calc Af Amer: >60 mL/min (ref >60)

## 2016-12-31 LAB — CBC
HCT: 42.8 % (ref 39.0–52.0)
HEMOGLOBIN: 14.3 g/dL (ref 13.0–17.0)
MCH: 28.1 pg (ref 26.0–34.0)
MCHC: 33.4 g/dL (ref 30.0–36.0)
MCV: 84.3 fL (ref 78.0–100.0)
PLATELETS: 166 10*3/uL (ref 150–400)
RBC: 5.08 MIL/uL (ref 4.22–5.81)
RDW: 13.6 % (ref 11.5–15.5)
WBC: 5.2 10*3/uL (ref 4.0–10.5)

## 2016-12-31 NOTE — Progress Notes (Signed)
Discharged from floor ambulatory for transport home by car. Wife & belongings with pt. No changes in assessment. Brycen Bean, [employer]

## 2016-12-31 NOTE — Discharge Summary (Signed)
Physician Discharge Summary  Patient ID: Cameron Jones MRN: [id number] DOB/AGE: 1965-10-20 51 y.o.  Admit date: 12/27/2016 Discharge date: 12/31/2016  Admission Diagnoses: SBO, VH  Discharge Diagnoses:  Active Problems:   Partial small bowel obstruction Resolved  Discharged Condition: good  Hospital Course: Patient was admitted with a ventral hernia and this partial small bowel obstruction. Patient was diagnosed with a ventral hernia as well as right inguinal hernia. Patient's obstruction slowly resolved. Patient began having bowel function. Patient was slow transition from a liquid diet to a regular diet which was able to tolerate well without any issues.  The patient was not a candidate at this time for surgical repair of his ventral hernia. Patient will follow back up as an outpatient for surgical hernia repair.   Consults: None  Significant Diagnostic Studies: CT scan with partial small bowel junction   Treatments: none  Discharge Exam: Blood pressure (!) 144/74, pulse (!) 56, temperature 98.2 F (36.8 C), temperature source Oral, resp. rate 18, height 6\' 1"  (1.854 m), weight (!) 194.1 kg (428 lb), SpO2 97 %. General appearance: alert and cooperative Cardio: regular rate and rhythm, S1, S2 normal, no murmur, click, rub or gallop GI: soft, non-tender; bowel sounds normal; no masses,  no organomegaly  Disposition: 01-Home or Self Care  Discharge Instructions    Diet - low sodium heart healthy    Complete by:  As directed    Increase activity slowly    Complete by:  As directed      Allergies as of 12/31/2016   No Known Allergies     Medication List    TAKE these medications   multivitamin tablet Take 1 tablet by mouth daily.   naproxen sodium 220 MG tablet Commonly known as:  ANAPROX Take 220 mg by mouth 2 (two) times daily as needed (pain).      Follow-up [facility], [place]. Call.   Specialty:  General Surgery Why:  as  needed Contact information: [address] [place] [phone]          Signed: Rosario Jacks., Anne Hahn 12/31/2016, 8:25 AM

## 2017-01-08 ENCOUNTER — Other Ambulatory Visit: Payer: Self-pay | Admitting: *Deleted
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
## 2017-01-09 ENCOUNTER — Other Ambulatory Visit: Payer: Self-pay | Admitting: *Deleted

## 2017-01-09 ENCOUNTER — Encounter: Payer: Self-pay | Admitting: *Deleted

## 2017-01-09 NOTE — Patient Outreach (Addendum)
[facility]) Care Management  01/09/2017  Cameron Jones 1966/03/25 [id number]   Subjective: Telephone call to patient's mobile number, spoke with patient, and HIPAA verified.   Discussed [facility] Care Management [employer] Transition of care follow up, patient voiced understanding, and is in agreement to complete follow up. States he is doing well, will Engineer, technical sales, and primary MD's office to set up hospital follow up appointments.   States he is in the process of finding a new primary MD because his primary MD (Dr. Sharin Mons) has left the practice.   Patient states he is in need of some educational materials via email on healthy eating.  Patient in agreement to receive the following EMMI educational materials via email: [place].   Verified patient's email address is:  [email or web address]. Patient states he does not have any transition of care, care coordination, disease management, disease monitoring, transportation, community resource, or pharmacy needs at this time.  States he is appreciative of the follow up call and is in agreement to receive [place] Management information.  Objective: Per Sunny Schlein and chart review, patient hospitalized  12/27/16 - 12/31/16 for Partial small bowel obstruction, ventral hernia , and right inguinal hernia.    Assessment: Received [employer] Transition of care referral on 01/08/17. Transition of care follow up completed, no care management needs, and will proceed with case closure.    Plan: RNCM will send patient successful outreach letter, [facility] pamphlet,  [place], and instructions to access EMMI educational materials.  RNCM will send case closure due to follow up completed / no care management needs request to [facility] [place] [facility].   Kaylamarie Swickard H. Annia Friendly, BSN, [place] Management [facility] Telephonic CM Phone: [phone] Fax: [phone]

## 2017-06-26 ENCOUNTER — Encounter: Payer: Self-pay | Admitting: Neurology

## 2017-06-27 ENCOUNTER — Ambulatory Visit (INDEPENDENT_AMBULATORY_CARE_PROVIDER_SITE_OTHER): Payer: Managed Care, Other (non HMO) | Admitting: Neurology

## 2017-06-27 ENCOUNTER — Encounter: Payer: Self-pay | Admitting: Neurology

## 2017-06-27 VITALS — BP 149/95 | HR 59 | Ht 72.0 in | Wt >= 6400 oz

## 2017-06-27 DIAGNOSIS — Z6841 Body Mass Index (BMI) 40.0 and over, adult: Secondary | ICD-10-CM | POA: Diagnosis not present

## 2017-06-27 DIAGNOSIS — E662 Morbid (severe) obesity with alveolar hypoventilation: Secondary | ICD-10-CM

## 2017-06-27 DIAGNOSIS — K439 Ventral hernia without obstruction or gangrene: Secondary | ICD-10-CM | POA: Diagnosis not present

## 2017-06-27 DIAGNOSIS — G4733 Obstructive sleep apnea (adult) (pediatric): Secondary | ICD-10-CM | POA: Diagnosis not present

## 2017-06-27 DIAGNOSIS — R351 Nocturia: Secondary | ICD-10-CM

## 2017-06-27 DIAGNOSIS — E669 Obesity, unspecified: Secondary | ICD-10-CM | POA: Diagnosis not present

## 2017-06-27 DIAGNOSIS — R0683 Snoring: Secondary | ICD-10-CM

## 2017-06-27 NOTE — Progress Notes (Signed)
SLEEP MEDICINE CLINIC   Provider:  Larey Seat, [place] D  Primary Care Physician:  Osborne Casco Fransico Him, MD   Referring Provider: Nigel Mormon, MD    Chief Complaint  Patient presents with  . New Patient (Initial Visit)    pt alone, rm 10. pt had a sleep study over 5 years ago. diagnosed with CPAP and used a machine for some time. pt stopped using CPAP for at least a year now and here today to get re set up. pt states he knows he isnt getting a good nights rest.     HPI:  Cameron Jones is a 51 y.o. male , seen here as in a referral  from Dr. Virgina Jock for  Re-evaluation for sleep apnea. Cameron Jones is a 51 year old African-American gentleman who has an extensive medical history due to superobesity.  The patient had a BMI of over 55 in 2013 when he underwent a lap band surgery, initially losing 100 pounds he developed an ulcer and the LAP-BAND had to be released. Prior to this weight loss surgery he had a sleep study which confirmed the presence of sleep apnea at the time that he soon became noncompliant with CPAP. His very first sleep study took place in the year 2000 2003 but he developed frequent sinusitis when using CPAP, had  pressure headaches , sometimes also dizziness. I do not have access to the original sleep studies. He has been followed by Dr. Sheryn Bison  at [place] for primary care during those year- he had his sleep study at [facility]. His machine is now 49 years old and he has no longer supplies.    He has recently been followed by Dr. Virgina Jock at [facility] cardiovascular services. He had significant blood pressure elevations that appear chronic. He did not have abnormalities of cardiac function and there was no carotid artery disease noted. In order to get the patient on a healthier lifestyle patient was referred for psychological support, treatment of possible bulimia, instructions in the [facility] diet.   Chief complaint according to patient : " I  need to get my apnea retested"  - "I snore, but rarely gasp for air".   Sleep habits are as follows: Mrs. Beigel is currently working on [place], which has put a 6 hour time difference between the couple. Arranging family life and communications has let to his bedtime being delay to 1-2 AM, arising at 6 AM. The patient's bedroom is cool and quiet and dark. He sleeps in a flat bed but on multiple pillows to elevate his chest and head. Cameron Jones has 2-3 bathroom breaks each night, over a period of only about 5 hours of sleep. He is not known to sleep walk sleep talk, no vivid dreams. He does not feel refreshed and restored in the morning, feels fatigued and in need of more sleep. He relies on an alarm at 6 AM. He gets his children ready for school his work starts at 8:30. Naps in daytime on week ends.    Family sleep history: no family history of OSA. No history of tonsillectomy.   Social history:  Married, 4 children age between 25-14 , no tobacco use, ETOH none, caffeine use- tea only- on ocassions.  No history of third shift work.    Past history Result Date: 12/29/2016 CLINICAL DATA:  Followup small bowel obstruction. EXAM: ABDOMEN - 1 VIEW COMPARISON:  12/28/2016.  12/27/2016. FINDINGS: Nasogastric tube tip in the antrum. Lap band projects over  the abdomen. Bowel gas pattern is normal without evidence of ileus or obstruction presently. Hernia cannot be appreciated on these frontal views. No abnormal calcifications. Ordinary degenerative changes affect the lumbar spine. IMPRESSION: Gas pattern now normal. Hernia cannot be appreciated on the frontal views. Electronically Signed   By: Nelson Chimes M.D.   On: 12/29/2016 12:22   Dg Abd 2 Views  Result Date: 12/28/2016 CLINICAL DATA:  Follow-up small bowel obstruction EXAM: ABDOMEN - 2 VIEW COMPARISON:  12/27/2016 FINDINGS: NG tube in place with tip in distal stomach. Again noted lap band in proximal gastric region. Persistent mild segmental gaseous  distension of small bowel loop in mid lower abdomen probable ileus or partial bowel obstruction. Some colonic gas and stool noted within distal colon. No evidence of free abdominal air. IMPRESSION: Persistent mild segmental gaseous distension of small bowel loop in mid lower abdomen probable ileus or partial bowel obstruction. Some colonic gas and stool noted within distal colon. No evidence of free abdominal air. Electronically Signed   By: Lahoma Crocker M.D.   On: 12/28/2016 11:06  Assessment/Plan:Partial small bowel obstruction/ventral hernia x2 History of colon cancer with partial colectomy/colostomy 2007 history of gastric banding 2013 GERD Morbid obesity Body mass index is 56.47 kg/m. Obstructive sleep apnea Arthritis    Review of Systems: Out of a complete 14 system review, the patient complains of only the following symptoms, and all other reviewed systems are negative.  heart burn, ventral hernia, snoring, nocturia.   How likely are you to doze in the following situations: 0 = not likely, 1 = slight chance, 2 = moderate chance, 3 = high chance  Sitting and Reading?  3 Watching Television? 3 Sitting inactive in a public place (theater or meeting)?2 As a passenger in a car for an hour without a break? 2  Lying down in the afternoon when circumstances permit? 2 Sitting and talking to someone? 0 Sitting quietly after lunch without alcohol?0 In a car, while stopped for a few minutes in traffic? 1 Total = 13    , Fatigue severity score 38  , depression score n/a   No flowsheet data found.     Social History   Social History  . Marital status: Married    Spouse name: N/A  . Number of children: N/A  . Years of education: N/A   Occupational History  . Not on file.   Social History Main Topics  . Smoking status: Never Smoker  . Smokeless tobacco: Never Used  . Alcohol use 0.5 oz/week    1 Standard drinks or equivalent per week     Comment: wine  1 glass evry 2-3  months  . Drug use: No  . Sexual activity: Yes    Birth control/ protection: None   Other Topics Concern  . Not on file   Social History Narrative  . No narrative on file    Family History  Problem Relation Age of Onset  . Cancer Mother        pancreatic  . Cancer Brother        colon  . Alzheimer's disease Father   . Depression Brother   . Cancer Maternal Aunt        breast    Past Medical History:  Diagnosis Date  . Arthritis    Knee  . Cancer ([place])    colon  . Carpal tunnel syndrome of right wrist   . Fatty liver    per Dr Radford Pax note  .  GERD (gastroesophageal reflux disease)   . Hernia   . History of blood transfusion   . Lower back pain   . Obesity   . Sleep apnea    severe per report 12/11  on chart  . Ventral hernia     Past Surgical History:  Procedure Laterality Date  . BREATH TEK H PYLORI  11/07/2011   Procedure: BREATH TEK H PYLORI;  Surgeon: Shann Medal, MD;  Location: [facility];  Service: General;  Laterality: N/A;  . BREATH TEK H PYLORI  01/02/2012   Procedure: BREATH TEK H PYLORI;  Surgeon: Pedro Earls, MD;  Location: [facility];  Service: General;  Laterality: N/A;  . COLON SURGERY  06/2006   colectomy  . COLONOSCOPY N/A 05/13/2013   Procedure: COLONOSCOPY;  Surgeon: Winfield Cunas., MD;  Location: [facility];  Service: Endoscopy;  Laterality: N/A;  . LAPAROSCOPIC GASTRIC BANDING  05/07/2012   Procedure: LAPAROSCOPIC GASTRIC BANDING;  Surgeon: Pedro Earls, MD;  Location: [facility];  Service: General;  Laterality: N/A;    Current Outpatient Prescriptions  Medication Sig Dispense Refill  . magnesium oxide (MAG-OX) 400 MG tablet Take 400 mg by mouth 2 (two) times daily.    . Multiple Vitamin (MULTIVITAMIN) tablet Take 1 tablet by mouth daily.    . naproxen sodium (ANAPROX) 220 MG tablet Take 220 mg by mouth 2 (two) times daily as needed (pain).      No current facility-administered medications for this visit.     Allergies  as of 06/27/2017  . (No Known Allergies)    Vitals: BP (!) 149/95   Pulse (!) 59   Ht 6' (1.829 m)   Wt (!) 435 lb (197.3 kg)   BMI 59.00 kg/m  Last Weight:  Wt Readings from Last 1 Encounters:  06/27/17 (!) 435 lb (197.3 kg)   OEV:OJJK mass index is 59 kg/m.     Last Height:   Ht Readings from Last 1 Encounters:  06/27/17 6' (1.829 m)    Physical exam:  General: The patient is awake, alert and appears not in acute distress. The patient is well groomed. Head: Normocephalic, atraumatic. Neck is supple. Mallampati 5,  lingua geographica  neck circumference:21. Nasal airflow patent , TMJ click not evident . Retrognathia is seen.  Cardiovascular:  Regular rate and rhythm , without  murmurs or carotid bruit, and without distended neck veins. Respiratory: Lungs are clear to auscultation. Skin:  Without evidence of edema, or rash Trunk: BMI is super obese . The patient is portly.  Neurologic exam : The patient is awake and alert, oriented to place and time.   Attention span & concentration ability appears normal.  Speech is fluent,  without dysarthria, dysphonia or aphasia.  Mood and affect are appropriate.  Cranial nerves: Pupils are equal and briskly reactive to light. Funduscopic exam without evidence of pallor or edema.  Extraocular movements  in vertical and horizontal planes intact and without nystagmus. Visual fields by finger perimetry are intact. Hearing to finger rub intact.  Facial sensation intact to fine touch. Facial motor strength is symmetric and tongue and uvula move midline. Shoulder shrug was symmetrical.   Motor exam: Normal tone, muscle bulk and symmetric strength in all extremities.  Sensory:  Fine touch, pinprick and vibration were tested in all extremities.  Proprioception tested in the upper extremities was normal. Coordination:Finger-to-nose maneuver normal without evidence of ataxia, dysmetria or tremor.  Gait and station: Patient walks without  assistive device .  Strength within normal limits.  Stance is stable and normal. Tandem gait is unfragmented. Turns with  4 Steps. Romberg testing is negative.  Deep tendon reflexes: upper and lower extremitie's DTR are symmetric and intact. Babinski maneuver response is downgoing.    Assessment:  After physical and neurologic examination, review of laboratory studies,  Personal review of imaging studies, reports of other /same  Imaging studies, results of polysomnography and / or neurophysiology testing and pre-existing records as far as provided in visit., my assessment is   1) Cameron Jones carries a diagnosis of obstructive sleep apnea for well over 15 years, but has not been able to use CPAP compliantly. His last sleep study was performed before he underwent gastric surgery, in his case a band procedure- after which she was able to lose over 100 pounds body weight, which she has slowly gained back. His cardiologist has seen him for lightheadedness spells, elevated blood pressure without tachycardia. It is his interest to restore the patient to CPAP use, if necessary. Given the persistence of risk factors present which include some nonmodifiable risk factors such as African-American race, male gender, age close to 69, the additional risk factors are the superobesity, large neck circumference, high-grade Mallampati and the comorbidities of ventral hernia, hypertension and status post failed abdominal surgery, hernia following a colon resection with colon cancer.    All this necessitates the retesting for SLEEP apnea, and the attended sleep study SPLIT night protocol I will order. The patient is a late sleeper. He is sleep deprived and excessively daytime sleepy. 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    The patient was advised of the nature of the diagnosed disorder , the treatment options and the  risks for general health and wellness arising from  not treating the condition.   I spent more than 45 minutes of face to face time with the patient.  Greater than 50% of time was spent in counseling and coordination of care. We have discussed the diagnosis and differential and I answered the patient's questions.    Plan:  Treatment plan and additional workup :  SPLIT with CO2 peak values and O2 measures. Patient is bradycardic. Please obtain CO2 for obesity hypoventilation risk patient with ventral hernia and BMI in excess I of 55.     Larey Seat, MD 00/11/4915, 9:15 AM  Certified in Neurology by ABPN Certified in [place] by [facility] [address], [place], [place] [address]

## 2017-07-26 ENCOUNTER — Telehealth: Payer: Self-pay | Admitting: Neurology

## 2017-07-26 DIAGNOSIS — G4733 Obstructive sleep apnea (adult) (pediatric): Secondary | ICD-10-CM | POA: Insufficient documentation

## 2017-07-26 NOTE — Telephone Encounter (Signed)
[employer] denied Split sleep study and approved a HST.  Can I get an order for HST? °

## 2017-08-03 ENCOUNTER — Other Ambulatory Visit: Payer: Self-pay | Admitting: Gastroenterology

## 2017-08-15 ENCOUNTER — Encounter (HOSPITAL_COMMUNITY): Payer: Self-pay | Admitting: Emergency Medicine

## 2017-08-15 ENCOUNTER — Other Ambulatory Visit: Payer: Self-pay

## 2017-08-28 ENCOUNTER — Telehealth: Payer: Self-pay | Admitting: Neurology

## 2017-08-28 NOTE — Telephone Encounter (Signed)
We have attempted to call the patient 2 times to schedule sleep study. Patient has been unavailable at the phone numbers we have on file and has not returned our calls. At this point we will send a letter asking pt to please contact the sleep lab to schedule their sleep study. If patient calls back we will schedule them for their sleep study. ° °

## 2017-08-30 ENCOUNTER — Ambulatory Visit (HOSPITAL_COMMUNITY): Payer: Managed Care, Other (non HMO) | Admitting: Anesthesiology

## 2017-08-30 ENCOUNTER — Encounter (HOSPITAL_COMMUNITY)
Admission: RE | Disposition: A | Discharge status: Home / Self Care | Payer: Self-pay | Source: Ambulatory Visit | Attending: Gastroenterology

## 2017-08-30 ENCOUNTER — Ambulatory Visit (HOSPITAL_COMMUNITY)
Admission: RE | Admit: 2017-08-30 | Discharge: 2017-08-30 | Disposition: A | Discharge status: Home / Self Care | Payer: Managed Care, Other (non HMO) | Source: Ambulatory Visit | Attending: Gastroenterology | Admitting: Gastroenterology

## 2017-08-30 ENCOUNTER — Other Ambulatory Visit: Payer: Self-pay

## 2017-08-30 ENCOUNTER — Encounter (HOSPITAL_COMMUNITY): Payer: Self-pay | Admitting: *Deleted

## 2017-08-30 DIAGNOSIS — Z85038 Personal history of other malignant neoplasm of large intestine: Secondary | ICD-10-CM | POA: Diagnosis not present

## 2017-08-30 DIAGNOSIS — M1710 Unilateral primary osteoarthritis, unspecified knee: Secondary | ICD-10-CM | POA: Insufficient documentation

## 2017-08-30 DIAGNOSIS — K760 Fatty (change of) liver, not elsewhere classified: Secondary | ICD-10-CM | POA: Diagnosis not present

## 2017-08-30 DIAGNOSIS — Z1211 Encounter for screening for malignant neoplasm of colon: Secondary | ICD-10-CM | POA: Diagnosis present

## 2017-08-30 DIAGNOSIS — K219 Gastro-esophageal reflux disease without esophagitis: Secondary | ICD-10-CM | POA: Insufficient documentation

## 2017-08-30 DIAGNOSIS — I10 Essential (primary) hypertension: Secondary | ICD-10-CM | POA: Insufficient documentation

## 2017-08-30 DIAGNOSIS — Z6841 Body Mass Index (BMI) 40.0 and over, adult: Secondary | ICD-10-CM | POA: Diagnosis not present

## 2017-08-30 DIAGNOSIS — Z980 Intestinal bypass and anastomosis status: Secondary | ICD-10-CM | POA: Insufficient documentation

## 2017-08-30 DIAGNOSIS — G4733 Obstructive sleep apnea (adult) (pediatric): Secondary | ICD-10-CM | POA: Diagnosis not present

## 2017-08-30 DIAGNOSIS — Z9049 Acquired absence of other specified parts of digestive tract: Secondary | ICD-10-CM | POA: Insufficient documentation

## 2017-08-30 DIAGNOSIS — Z800 Family history of malignant neoplasm of digestive organs: Secondary | ICD-10-CM | POA: Insufficient documentation

## 2017-08-30 HISTORY — DX: Essential (primary) hypertension: I10

## 2017-08-30 HISTORY — PX: COLONOSCOPY: SHX5424

## 2017-08-30 SURGERY — COLONOSCOPY
Anesthesia: Monitor Anesthesia Care

## 2017-08-30 MED ORDER — PROPOFOL 10 MG/ML IV BOLUS
INTRAVENOUS | Status: DC | PRN
  Administered 2017-08-30: 50 mg via INTRAVENOUS
  Administered 2017-08-30: 20 mg via INTRAVENOUS

## 2017-08-30 MED ORDER — PROPOFOL 10 MG/ML IV BOLUS
INTRAVENOUS | Status: AC
  Filled 2017-08-30: qty 40

## 2017-08-30 MED ORDER — LIDOCAINE 2% (20 MG/ML) 5 ML SYRINGE
INTRAMUSCULAR | Status: DC | PRN
  Administered 2017-08-30: 75 mg via INTRAVENOUS
  Administered 2017-08-30: 80 mg via INTRAVENOUS
  Administered 2017-08-30: 50 mg via INTRAVENOUS

## 2017-08-30 MED ORDER — LACTATED RINGERS IV SOLN
INTRAVENOUS | Status: DC
  Administered 2017-08-30: 1000 mL via INTRAVENOUS

## 2017-08-30 MED ORDER — SODIUM CHLORIDE 0.9 % IV SOLN: INTRAVENOUS | Status: DC

## 2017-08-30 MED ORDER — PROPOFOL 10 MG/ML IV BOLUS
INTRAVENOUS | Status: AC
  Filled 2017-08-30: qty 20

## 2017-08-30 MED ORDER — PROPOFOL 500 MG/50ML IV EMUL
INTRAVENOUS | Status: DC | PRN
  Administered 2017-08-30: 80 ug/kg/min via INTRAVENOUS

## 2017-08-30 MED ORDER — KETAMINE HCL 10 MG/ML IJ SOLN
INTRAMUSCULAR | Status: DC | PRN
  Administered 2017-08-30: 20 mg via INTRAVENOUS

## 2017-08-30 NOTE — Transfer of Care (Signed)
Immediate Anesthesia Transfer of Care Note  Patient: Cameron Jones  Procedure(s) Performed: Procedure(s): COLONOSCOPY (N/A)  Patient Location: PACU  Anesthesia Type:MAC  Level of Consciousness:  sedated, patient cooperative and responds to stimulation  Airway & Oxygen Therapy:Patient Spontanous Breathing and Patient connected to face mask oxgen  Post-op Assessment:  Report given to PACU RN and Post -op Vital signs reviewed and stable  Post vital signs:  Reviewed and stable  Last Vitals:  Vitals:   08/30/17 1014  BP: (!) 188/89  Resp: (!) 24  Temp: 36.7 C  SpO2: 58%    Complications: No apparent anesthesia complications

## 2017-08-30 NOTE — H&P (Signed)
Subjective:   Patient is a 51 y.o. male presents with a history of colon cancer with a right hemicolectomy 10/07. Last colonoscopy 8/14. Strong family history of colon cancer as well with his brother having colon cancer at age 11.. Procedure including risks and benefits discussed in office.  Patient Active Problem List   Diagnosis Date Noted  . OSA (obstructive sleep apnea) 07/26/2017  . Partial small bowel obstruction ([place]) 12/27/2016  . 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 Colon cancer-right T3 N0, Oct 2007 10/06/2011  . Ventral hernia-midline 10/06/2011  . Obstructive sleep apnea of adult 10/06/2011  . Obesity BMI 61 10/06/2011   Past Medical History:  Diagnosis Date  . Arthritis    Knee  . Cancer ([place])    colon  . Carpal tunnel syndrome of right wrist   . Fatty liver    per Dr Radford Pax note  . GERD (gastroesophageal reflux disease)   . Hernia   . History of blood transfusion   . Hypertension   . Lower back pain   . Obesity   . Sleep apnea    severe per report 12/11  on chart  . Ventral hernia     Past Surgical History:  Procedure Laterality Date  . BREATH TEK H PYLORI  11/07/2011   Procedure: BREATH TEK H PYLORI;  Surgeon: Shann Medal, MD;  Location: [facility];  Service: General;  Laterality: N/A;  . BREATH TEK H PYLORI  01/02/2012   Procedure: BREATH TEK H PYLORI;  Surgeon: Pedro Earls, MD;  Location: [facility];  Service: General;  Laterality: N/A;  . COLON SURGERY  06/2006   colectomy  . COLONOSCOPY N/A 05/13/2013   Procedure: COLONOSCOPY;  Surgeon: Winfield Cunas., MD;  Location: [facility];  Service: Endoscopy;  Laterality: N/A;  . LAPAROSCOPIC GASTRIC BANDING  05/07/2012   Procedure: LAPAROSCOPIC GASTRIC BANDING;  Surgeon: Pedro Earls, MD;  Location: [facility];  Service: General;  Laterality: N/A;    Medications Prior to Admission  Medication Sig Dispense Refill Last Dose  . magnesium oxide (MAG-OX) 400 MG tablet Take 800 mg by mouth at  bedtime.   Past Week at Unknown time  . Multiple Vitamin (MULTIVITAMIN WITH MINERALS) TABS tablet Take 1 tablet by mouth at bedtime.   Past Month at Unknown time   No Known Allergies  Social History   Tobacco Use  . Smoking status: Never Smoker  . Smokeless tobacco: Never Used  Substance Use Topics  . Alcohol use: Yes    Alcohol/week: 0.5 oz    Types: 1 Standard drinks or equivalent per week    Comment: wine  1 glass evry 2-3 months    Family History  Problem Relation Age of Onset  . Cancer Mother        pancreatic  . Cancer Brother        colon  . Alzheimer's disease Father   . Depression Brother   . Cancer Maternal Aunt        breast     Objective:   Patient Vitals for the past 8 hrs:  BP Temp Temp src Resp SpO2 Height Weight  08/30/17 1014 (!) 188/89 98.1 F (36.7 C) Oral (!) 24 98 % 6' (1.829 m) (!) 197.3 kg (435 lb)   No intake/output data recorded. No intake/output data recorded.   See MD Preop evaluation      Assessment:   1. History of colon cancer. Status post right hemicolectomy 2. Strong family  history of colon cancer 3. Morbid obesity  Plan:   Will proceed with colonoscopy. Patient has had this several times and will undergo polypectomy or biopsy as needed.

## 2017-08-30 NOTE — Op Note (Signed)
[facility] Patient Name: Cameron Jones Procedure Date: 08/30/2017 MRN: [id number] Attending MD: Nancy Fetter Dr., MD Date of Birth: 1966-08-01 CSN: [id number] Age: 51 Admit Type: Outpatient Procedure:                Colonoscopy Indications:              High risk colon cancer surveillance: Personal                            history of colon cancer, Last colonoscopy: 2014 Providers:                Joyice Faster. Daizy Outen Dr., MD, Carolynn Comment RN, RN,                            Cherylynn Ridges, Technician, Anne Fu CRNA,                            CRNA Referring MD:              Medicines:                Monitored Anesthesia Care Complications:            No immediate complications. Estimated Blood Loss:     Estimated blood loss: none. Procedure:                Pre-Anesthesia Assessment:                           - Prior to the procedure, a History and Physical                            was performed, and patient medications and                            allergies were reviewed. The patient's tolerance of                            previous anesthesia was also reviewed. The risks                            and benefits of the procedure and the sedation                            options and risks were discussed with the patient.                            All questions were answered, and informed consent                            was obtained. Prior Anticoagulants: The patient has                            taken no previous anticoagulant or antiplatelet  agents. ASA Grade Assessment: III - A patient with                            severe systemic disease. After reviewing the risks                            and benefits, the patient was deemed in                            satisfactory condition to undergo the procedure.                           After obtaining informed consent, the colonoscope                            was  passed under direct vision. Throughout the                            procedure, the patient's blood pressure, pulse, and                            oxygen saturations were monitored continuously. The                            Colonoscope was introduced through the anus and                            advanced to the the ileocolonic anastomosis. The                            colonoscopy was performed without difficulty. The                            patient tolerated the procedure well. The quality                            of the bowel preparation was good. Surgical                            anastamosis were photographed. Scope In: 11:25:08 AM Scope Out: 11:42:34 AM Scope Withdrawal Time: 0 hours 11 minutes 34 seconds  Total Procedure Duration: 0 hours 17 minutes 26 seconds  Findings:      The perianal and digital rectal examinations were normal.      There is no endoscopic evidence of bleeding, diverticula, erythema,       mass, polyps or tumor in the recto-sigmoid colon, in the descending       colon, in the splenic flexure, in the transverse colon and in the       hepatic flexure.      The retroflexed view of the distal rectum and anal verge was normal and       showed no anal or rectal abnormalities. Impression:               - The distal rectum and anal verge are normal on  retroflexion view.                           - No specimens collected.                           - Personal history of malignant neoplasm of the                            colon. Moderate Sedation:      See anesthesia note, no moderate sedation. Recommendation:           - Patient has a contact number available for                            emergencies. The signs and symptoms of potential                            delayed complications were discussed with the                            patient. Return to normal activities tomorrow.                            Written discharge  instructions were provided to the                            patient.                           - Resume previous diet.                           - Continue present medications.                           - Repeat colonoscopy in 5 years for surveillance. Procedure Code(s):        --- Professional ---                           [phone], Colonoscopy, flexible; diagnostic, including                            collection of specimen(s) by brushing or washing,                            when performed (separate procedure) Diagnosis Code(s):        --- Professional ---                           W73.710, Personal history of other malignant                            neoplasm of large intestine CPT copyright 2016 American Medical Association. All rights reserved. The codes documented in this report are preliminary and upon coder review may  be revised to meet current compliance requirements. Nancy Fetter Dr.,  MD 08/30/2017 11:58:36 AM This report has been signed electronically. Number of Addenda: 0

## 2017-08-30 NOTE — Anesthesia Postprocedure Evaluation (Signed)
Anesthesia Post Note  Patient: Cameron Jones  Procedure(s) Performed: COLONOSCOPY (N/A )     Patient location during evaluation: Endoscopy Anesthesia Type: MAC Level of consciousness: awake and alert Pain management: pain level controlled Vital Signs Assessment: post-procedure vital signs reviewed and stable Respiratory status: spontaneous breathing, nonlabored ventilation, respiratory function stable and patient connected to nasal cannula oxygen Cardiovascular status: stable and blood pressure returned to baseline Postop Assessment: no apparent nausea or vomiting Anesthetic complications: no    Last Vitals:  Vitals:   08/30/17 1149 08/30/17 1150  BP: 123/69 133/84  Pulse: 69 92  Resp: (!) 5 (!) 21  Temp: 36.6 C   SpO2: 100% 100%    Last Pain:  Vitals:   08/30/17 1149  TempSrc: Oral                 Catalia Massett,JAMES TERRILL

## 2017-08-30 NOTE — Discharge Instructions (Signed)

## 2017-08-30 NOTE — Anesthesia Preprocedure Evaluation (Signed)
Anesthesia Evaluation  Patient identified by MRN, date of birth, ID band Patient awake    Reviewed: Allergy & Precautions, NPO status , Patient's Chart, lab work & pertinent test results  Airway Mallampati: I  TM Distance: >3 FB Neck ROM: Full    Dental   Pulmonary sleep apnea ,    breath sounds clear to auscultation       Cardiovascular hypertension,  Rhythm:Regular Rate:Normal     Neuro/Psych    GI/Hepatic GERD  ,  Endo/Other  Morbid obesity  Renal/GU      Musculoskeletal  (+) Arthritis ,   Abdominal (+) + obese,   Peds  Hematology   Anesthesia Other Findings   Reproductive/Obstetrics                             Anesthesia Physical Anesthesia Plan  ASA: III  Anesthesia Plan: MAC   Post-op Pain Management:    Induction: Intravenous  PONV Risk Score and Plan: 1 and Treatment may vary due to age or medical condition  Airway Management Planned: Natural Airway and Simple Face Mask  Additional Equipment:   Intra-op Plan:   Post-operative Plan:   Informed Consent: I have reviewed the patients History and Physical, chart, labs and discussed the procedure including the risks, benefits and alternatives for the proposed anesthesia with the patient or authorized representative who has indicated his/her understanding and acceptance.     Plan Discussed with: CRNA  Anesthesia Plan Comments:         Anesthesia Quick Evaluation

## 2017-09-12 ENCOUNTER — Ambulatory Visit (INDEPENDENT_AMBULATORY_CARE_PROVIDER_SITE_OTHER): Payer: Managed Care, Other (non HMO) | Admitting: Neurology

## 2017-09-12 DIAGNOSIS — G4733 Obstructive sleep apnea (adult) (pediatric): Secondary | ICD-10-CM

## 2017-09-18 ENCOUNTER — Other Ambulatory Visit: Payer: Self-pay | Admitting: Neurology

## 2017-09-18 DIAGNOSIS — Z9884 Bariatric surgery status: Secondary | ICD-10-CM

## 2017-09-18 DIAGNOSIS — G4733 Obstructive sleep apnea (adult) (pediatric): Secondary | ICD-10-CM

## 2017-09-18 DIAGNOSIS — G4734 Idiopathic sleep related nonobstructive alveolar hypoventilation: Principal | ICD-10-CM

## 2017-09-18 NOTE — Procedures (Signed)
[facility] [place], [place] [address] NAME: Cameron Jones                               DOB: Feb 19, 1966 MEDICAL RECORD [id number]                      DOS: 09/12/2017 REFERRING PHYSICIAN: Vernell Leep, M.D.  STUDY PERFORMED: HST, apnea link HISTORY: [name] is a 51 y.o. male patient, seen here as in a referral from Dr. Virgina Jock for a Re-evaluation for sleep apnea. Mr. Needle is a 51 year old African-American gentleman who has an extensive medical history due to super-obesity. Prior to his lap band weight loss surgery in 2013 at a BMI of 58 he had a sleep study which confirmed the presence of sleep apnea - as did a previous study in 2003- but he soon became noncompliant with CPAP. After lap band surgery he was able initially to lose 100 pounds, but he developed an ulcer and the LAP-BAND had to be released.  He has been followed by Dr. Sheryn Bison at [place] for primary care during those year- sleep study at [facility]. His machine is now 49 years old and he has no longer supplies.  Epworth Sleepiness score endorsed at 13 points, Fatigue severity score 38 points. BMI 58.8    STUDY RESULTS: Total Recording Time: 9 hours, 32 minutes  Total Apnea/Hypopnea Index (AHI): 20.8 /hour, and RDI was 22.9/hr. Mostly OSA.  Average Oxygen Saturation: SpO2 92 %; Lowest Oxygen Desaturation: 78 %  Total Time Oxygen Saturation Below 89%: 31 minutes  Average Heart Rate: 86 (56-138 bpm)  IMPRESSION: Moderate Severe Degree of OSA, loud snoring and hypoxemia.  RECOMMENDATION: PAP therapy is needed to correct all three factors named above. Order auto titration CPAP 5-18 cm water, 3 cm EPR and heated humidity, mask of choice.  I certify that I have reviewed the raw data recording prior to the issuance of this report in accordance with the standards of the American Academy of Sleep Medicine (AASM). Larey Seat, M.D.     09-18-2017   Medical  Director of [facility] at [facility], [place] of the ABPN and ABSM and accredited by AASM

## 2017-09-19 ENCOUNTER — Telehealth: Payer: Self-pay | Admitting: Neurology

## 2017-09-19 NOTE — Telephone Encounter (Signed)
Pt returned call. I advised pt that Dr. Brett Fairy reviewed their sleep study results and found that pt has sleep apnea. Dr. Brett Fairy recommends that pt starts a auto CPAP. I reviewed PAP compliance expectations with the pt. Pt is agreeable to starting a CPAP. I advised pt that an order will be sent to a DME, [facility], and [facility] will call the pt within about one week after they file with the pt's insurance. [facility] will show the pt how to use the machine, fit for masks, and troubleshoot the CPAP if needed. A follow up appt was made for insurance purposes with Cecille Rubin, NP on March 21, 8:15 am . Pt verbalized understanding to arrive 15 minutes early and bring their CPAP. A letter with all of this information in it will be mailed to the pt as a reminder. I verified with the pt that the address we have on file is correct. Pt verbalized understanding of results. Pt had no questions at this time but was encouraged to call back if questions arise.

## 2017-09-19 NOTE — Telephone Encounter (Signed)
Called patient to discuss sleep study results. No answer at this time. LVM for the patient to call back.   

## 2017-09-19 NOTE — Telephone Encounter (Signed)
-----   Message from Larey Seat, MD sent at 09/18/2017  3:17 PM EST ----- IMPRESSION: 1)Moderate Severe Degree of OSA, 2)loud snoring and  3) sleep related hypoxemia.  RECOMMENDATION: PAP therapy is needed to correct all three  factors named above. Order auto titration CPAP 5-18 cm water, 3  cm EPR and heated humidity, mask of choice.   The patient was encouraged to enroll in a medical weight loss program - CD

## 2017-11-22 ENCOUNTER — Telehealth: Payer: Self-pay | Admitting: Neurology

## 2017-11-22 NOTE — Telephone Encounter (Signed)
Received a notification form [facility] in regards to the pt now starting CPAP.  "I have contacted this patient 3 times with no returned call. I am voiding sales order."  The patient can call [facility] at [phone] when they are ready to initiate CPAP therapy

## 2017-12-11 NOTE — Progress Notes (Deleted)
[facility]  PATIENT: Cameron Jones DOB: March 26, 1966   REASON FOR VISIT: Follow-up for newly diagnosed obstructive sleep apnea with initial CPAP compliance HISTORY FROM:    HISTORY OF PRESENT ILLNESS: Cameron Jones is a 52 y.o. male , seen here as in a referral  from Dr. Virgina Jock for  Re-evaluation for sleep apnea. Cameron Jones is a 52 year old African-American gentleman who has an extensive medical history due to superobesity.  The patient had a BMI of over 55 in 2013 when he underwent a lap band surgery, initially losing 100 pounds he developed an ulcer and the LAP-BAND had to be released. Prior to this weight loss surgery he had a sleep study which confirmed the presence of sleep apnea at the time that he soon became noncompliant with CPAP. His very first sleep study took place in the year 2000 2003 but he developed frequent sinusitis when using CPAP, had  pressure headaches , sometimes also dizziness. I do not have access to the original sleep studies. He has been followed by Dr. Sheryn Bison  at [place] for primary care during those year- he had his sleep study at [facility]. His machine is now 50 years old and he has no longer supplies.    He has recently been followed by Dr. Virgina Jock at [facility] cardiovascular services. He had significant blood pressure elevations that appear chronic. He did not have abnormalities of cardiac function and there was no carotid artery disease noted. In order to get the patient on a healthier lifestyle patient was referred for psychological support, treatment of possible bulimia, instructions in the [facility] diet.   Chief complaint according to patient : " I need to get my apnea retested"  - "I snore, but rarely gasp for air".   Sleep habits are as follows: Mrs. Montesinos is currently working on [place], which has put a 6 hour time difference between the couple. Arranging family life and communications has let to his  bedtime being delay to 1-2 AM, arising at 6 AM. The patient's bedroom is cool and quiet and dark. He sleeps in a flat bed but on multiple pillows to elevate his chest and head. Cameron Jones has 2-3 bathroom breaks each night, over a period of only about 5 hours of sleep. He is not known to sleep walk sleep talk, no vivid dreams. He does not feel refreshed and restored in the morning, feels fatigued and in need of more sleep. He relies on an alarm at 6 AM. He gets his children ready for school his work starts at 8:30. Naps in daytime on week ends.    REVIEW OF SYSTEMS: Full 14 system review of systems performed and notable only for those listed, all others are neg:  Constitutional: neg  Cardiovascular: neg Ear/Nose/Throat: neg  Skin: neg Eyes: neg Respiratory: neg Gastroitestinal: neg  Hematology/Lymphatic: neg  Endocrine: neg Musculoskeletal:neg Allergy/Immunology: neg Neurological: neg Psychiatric: neg Sleep : neg   ALLERGIES: No Known Allergies  HOME MEDICATIONS: Outpatient Medications Prior to Visit  Medication Sig Dispense Refill  . magnesium oxide (MAG-OX) 400 MG tablet Take 800 mg by mouth at bedtime.    . Multiple Vitamin (MULTIVITAMIN WITH MINERALS) TABS tablet Take 1 tablet by mouth at bedtime.     No facility-administered medications prior to visit.     PAST MEDICAL HISTORY: Past Medical History:  Diagnosis Date  . Arthritis    Knee  . Cancer ([facility])    colon  . Carpal tunnel syndrome  of right wrist   . Fatty liver    per Dr Radford Pax note  . GERD (gastroesophageal reflux disease)   . Hernia   . History of blood transfusion   . Hypertension   . Lower back pain   . Obesity   . Sleep apnea    severe per report 12/11  on chart  . Ventral hernia     PAST SURGICAL HISTORY: Past Surgical History:  Procedure Laterality Date  . BREATH TEK H PYLORI  11/07/2011   Procedure: BREATH TEK H PYLORI;  Surgeon: Shann Medal, MD;  Location: [facility];  Service:  General;  Laterality: N/A;  . BREATH TEK H PYLORI  01/02/2012   Procedure: BREATH TEK H PYLORI;  Surgeon: Pedro Earls, MD;  Location: [facility];  Service: General;  Laterality: N/A;  . COLON SURGERY  06/2006   colectomy  . COLONOSCOPY N/A 05/13/2013   Procedure: COLONOSCOPY;  Surgeon: Winfield Cunas., MD;  Location: [facility];  Service: Endoscopy;  Laterality: N/A;  . COLONOSCOPY N/A 08/30/2017   Procedure: COLONOSCOPY;  Surgeon: Laurence Spates, MD;  Location: [facility] ENDOSCOPY;  Service: Endoscopy;  Laterality: N/A;  . LAPAROSCOPIC GASTRIC BANDING  05/07/2012   Procedure: LAPAROSCOPIC GASTRIC BANDING;  Surgeon: Pedro Earls, MD;  Location: [facility] ORS;  Service: General;  Laterality: N/A;    FAMILY HISTORY: Family History  Problem Relation Age of Onset  . Cancer Mother        pancreatic  . Cancer Brother        colon  . Alzheimer's disease Father   . Depression Brother   . Cancer Maternal Aunt        breast    SOCIAL HISTORY: Social History   Socioeconomic History  . Marital status: Married    Spouse name: Not on file  . Number of children: Not on file  . Years of education: Not on file  . Highest education level: Not on file  Social Needs  . Financial resource strain: Not on file  . Food insecurity - worry: Not on file  . Food insecurity - inability: Not on file  . Transportation needs - medical: Not on file  . Transportation needs - non-medical: Not on file  Occupational History  . Not on file  Tobacco Use  . Smoking status: Never Smoker  . Smokeless tobacco: Never Used  Substance and Sexual Activity  . Alcohol use: Yes    Alcohol/week: 0.5 oz    Types: 1 Standard drinks or equivalent per week    Comment: wine  1 glass evry 2-3 months  . Drug use: No  . Sexual activity: Yes    Birth control/protection: None  Other Topics Concern  . Not on file  Social History Narrative  . Not on file     PHYSICAL EXAM  There were no vitals filed for this  visit. There is no height or weight on file to calculate BMI.  Generalized: Well developed, in no acute distress  Head: normocephalic and atraumatic,. Oropharynx benign  Neck: Supple, no carotid bruits  Cardiac: Regular rate rhythm, no murmur  Musculoskeletal: No deformity   Neurological examination   Mentation: Alert oriented to time, place, history taking. Attention span and concentration appropriate. Recent and remote memory intact.  Follows all commands speech and language fluent.   Cranial nerve II-XII: Fundoscopic exam reveals sharp disc margins.Pupils were equal round reactive to light extraocular movements were full, visual field were full on confrontational test.  Facial sensation and strength were normal. hearing was intact to finger rubbing bilaterally. Uvula tongue midline. head turning and shoulder shrug were normal and symmetric.Tongue protrusion into cheek strength was normal. Motor: normal bulk and tone, full strength in the BUE, BLE, fine finger movements normal, no pronator drift. No focal weakness Sensory: normal and symmetric to light touch, pinprick, and  Vibration, proprioception  Coordination: finger-nose-finger, heel-to-shin bilaterally, no dysmetria Reflexes: Brachioradialis 2/2, biceps 2/2, triceps 2/2, patellar 2/2, Achilles 2/2, plantar responses were flexor bilaterally. Gait and Station: Rising up from seated position without assistance, normal stance,  moderate stride, good arm swing, smooth turning, able to perform tiptoe, and heel walking without difficulty. Tandem gait is steady  DIAGNOSTIC DATA (LABS, IMAGING, TESTING) - I reviewed patient records, labs, notes, testing and imaging myself where available.  Lab Results  Component Value Date   WBC 5.2 12/31/2016   HGB 14.3 12/31/2016   HCT 42.8 12/31/2016   MCV 84.3 12/31/2016   PLT 166 12/31/2016      Component Value Date/Time   NA 138 12/28/2016 0432   K 3.5 12/28/2016 0432   CL 105 12/28/2016 0432    CO2 27 12/28/2016 0432   GLUCOSE 111 (H) 12/28/2016 0432   BUN 7 12/28/2016 0432   CREATININE 0.97 12/31/2016 0441   CALCIUM 8.9 12/28/2016 0432   PROT 8.0 12/27/2016 0438   ALBUMIN 4.9 12/27/2016 0438   AST 44 (H) 12/27/2016 0438   ALT 65 (H) 12/27/2016 0438   ALKPHOS 40 12/27/2016 0438   BILITOT 0.9 12/27/2016 0438   GFRNONAA >60 12/31/2016 0441   GFRAA >60 12/31/2016 0441   No results found for: CHOL, HDL, LDLCALC, LDLDIRECT, TRIG, CHOLHDL No results found for: HGBA1C No results found for: VITAMINB12 Lab Results  Component Value Date   TSH 1.548 02/23/2012    ***  ASSESSMENT AND PLAN  52 y.o. year old male  has a past medical history of Arthritis, Cancer ([place]), Carpal tunnel syndrome of right wrist, Fatty liver, GERD (gastroesophageal reflux disease), Hernia, History of blood transfusion, Hypertension, Lower back pain, Obesity, Sleep apnea, and Ventral hernia. here with ***    Rayburn Ma, [facility], APRN  [facility] [address] [place], [place] [address] [phone]

## 2017-12-13 ENCOUNTER — Telehealth: Payer: Self-pay | Admitting: *Deleted

## 2017-12-13 ENCOUNTER — Ambulatory Visit: Payer: Self-pay | Admitting: Nurse Practitioner

## 2017-12-13 NOTE — Telephone Encounter (Signed)
Patient was no show for FU with NP today. 

## 2017-12-14 ENCOUNTER — Encounter: Payer: Self-pay | Admitting: Nurse Practitioner

## 2018-05-02 ENCOUNTER — Encounter (HOSPITAL_COMMUNITY): Payer: Self-pay

## 2018-05-02 ENCOUNTER — Emergency Department (HOSPITAL_COMMUNITY)
Admission: EM | Admit: 2018-05-02 | Discharge: 2018-05-03 | Disposition: A | Discharge status: Home / Self Care | Payer: Managed Care, Other (non HMO) | Attending: Emergency Medicine | Admitting: Emergency Medicine

## 2018-05-02 ENCOUNTER — Other Ambulatory Visit: Payer: Self-pay

## 2018-05-02 ENCOUNTER — Emergency Department (HOSPITAL_COMMUNITY): Payer: Managed Care, Other (non HMO)

## 2018-05-02 DIAGNOSIS — Z85038 Personal history of other malignant neoplasm of large intestine: Secondary | ICD-10-CM | POA: Insufficient documentation

## 2018-05-02 DIAGNOSIS — Z79899 Other long term (current) drug therapy: Secondary | ICD-10-CM | POA: Insufficient documentation

## 2018-05-02 DIAGNOSIS — I10 Essential (primary) hypertension: Secondary | ICD-10-CM | POA: Diagnosis not present

## 2018-05-02 DIAGNOSIS — R1013 Epigastric pain: Secondary | ICD-10-CM | POA: Diagnosis present

## 2018-05-02 DIAGNOSIS — K439 Ventral hernia without obstruction or gangrene: Secondary | ICD-10-CM

## 2018-05-02 LAB — CBC
HCT: 47.4 % (ref 39.0–52.0)
Hemoglobin: 16.1 g/dL (ref 13.0–17.0)
MCH: 29.3 pg (ref 26.0–34.0)
MCHC: 34 g/dL (ref 30.0–36.0)
MCV: 86.2 fL (ref 78.0–100.0)
PLATELETS: 226 10*3/uL (ref 150–400)
RBC: 5.5 MIL/uL (ref 4.22–5.81)
RDW: 14.1 % (ref 11.5–15.5)
WBC: 6.5 10*3/uL (ref 4.0–10.5)

## 2018-05-02 LAB — COMPREHENSIVE METABOLIC PANEL
ALBUMIN: 4.3 g/dL (ref 3.5–5.0)
ALK PHOS: 45 U/L (ref 38–126)
ALT: 80 U/L — AB (ref 0–44)
AST: 51 U/L — AB (ref 15–41)
Anion gap: 9 (ref 5–15)
BILIRUBIN TOTAL: 0.8 mg/dL (ref 0.3–1.2)
BUN: 9 mg/dL (ref 6–20)
CALCIUM: 9.5 mg/dL (ref 8.9–10.3)
CO2: 27 mmol/L (ref 22–32)
CREATININE: 0.89 mg/dL (ref 0.61–1.24)
Chloride: 105 mmol/L (ref 98–111)
GFR calc Af Amer: >60 mL/min (ref >60)
GLUCOSE: 109 mg/dL — AB (ref 70–99)
Potassium: 4 mmol/L (ref 3.5–5.1)
Sodium: 141 mmol/L (ref 135–145)
Total Protein: 7.4 g/dL (ref 6.5–8.1)

## 2018-05-02 LAB — I-STAT TROPONIN, ED: Troponin i, poc: 0 ng/mL (ref 0.00–0.08)

## 2018-05-02 LAB — LIPASE, BLOOD: Lipase: 26 U/L (ref 11–51)

## 2018-05-02 MED ORDER — HYDROMORPHONE HCL 1 MG/ML IJ SOLN
1.0000 mg | Freq: Once | INTRAMUSCULAR | Status: AC
  Administered 2018-05-02: 1 mg via INTRAVENOUS
  Filled 2018-05-02: qty 1

## 2018-05-02 MED ORDER — MORPHINE SULFATE (PF) 4 MG/ML IV SOLN
4.0000 mg | Freq: Once | INTRAVENOUS | Status: AC
Start: 2018-05-02 — End: 2018-05-02
  Administered 2018-05-02: 4 mg via INTRAVENOUS
  Filled 2018-05-02: qty 1

## 2018-05-02 MED ORDER — ONDANSETRON HCL 4 MG/2ML IJ SOLN
4.0000 mg | Freq: Once | INTRAMUSCULAR | Status: AC
  Administered 2018-05-02: 4 mg via INTRAVENOUS
  Filled 2018-05-02: qty 2

## 2018-05-02 MED ORDER — IOPAMIDOL (ISOVUE-300) INJECTION 61%
100.0000 mL | Freq: Once | INTRAVENOUS | Status: AC | PRN
  Administered 2018-05-02: 100 mL via INTRAVENOUS

## 2018-05-02 MED ORDER — OXYCODONE-ACETAMINOPHEN 5-325 MG PO TABS: 2.0000 | ORAL_TABLET | ORAL | 0 refills | Status: DC | PRN

## 2018-05-02 MED ORDER — ONDANSETRON HCL 4 MG PO TABS: 4.0000 mg | ORAL_TABLET | Freq: Three times a day (TID) | ORAL | 0 refills | Status: DC | PRN

## 2018-05-02 MED ORDER — SODIUM CHLORIDE 0.9 % IV BOLUS
1000.0000 mL | Freq: Once | INTRAVENOUS | Status: AC
  Administered 2018-05-02: 1000 mL via INTRAVENOUS

## 2018-05-02 MED ORDER — IOPAMIDOL (ISOVUE-300) INJECTION 61%
INTRAVENOUS | Status: AC
  Filled 2018-05-02: qty 100

## 2018-05-02 NOTE — ED Triage Notes (Signed)
Pt states that he has a hernia, and he believes it's "popped through the wall". Pt states this has happened to him before, and it feels the same. Pt was at work when this occurred.

## 2018-05-02 NOTE — ED Notes (Signed)
Patient states 10/10 abd pain prior to Zofran and morphine-patient and family updated on plan of care-pain control and CT scan abd

## 2018-05-02 NOTE — ED Notes (Signed)
Patient brought back to Res B from waiting room-patient is profusely diaphoretic-states he has a hernia and is having severe pain-states he laid down on the floor because he felt like "I was going to pass out"-patient placed on stretcher and undressed-patient has large abdominal habitus with previous scarring to mid abd from previous surgery-PIV started by [employer] and patient placed on monitor-Dr. Hillard Danker made aware and in to do MSE.

## 2018-05-02 NOTE — ED Notes (Signed)
Wife and daughter at bedside. 

## 2018-05-02 NOTE — ED Provider Notes (Signed)
[facility] DEPT Provider Note   CSN: [id number] Arrival date & time: 05/02/18  1853     History   Chief Complaint Chief Complaint  Patient presents with  . Hernia    HPI Cameron Jones is a 52 y.o. male with a history of colon cancer status post bowel colectomy (2007) with a prior lap banding (2013) with a history of a ventral hernia who presents emergency department today for abdominal pain.  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 He reports he tried to lay down for his symptoms without any relief. He has not tried any medications for his symptoms. He reports that he has nausea and has been dry heaving with this. He denies any emesis.  He reports he has not been able to pass gas since the onset of his symptoms.  On the waiting room patient notes that his pain dramatically increased and he was found to be profusely diaphoretic and laid on the floor because he felt he was going to pass out because the pain was so severe. Patient denies any associated chest, neck or back pain.  He denies any shortness of breath with this.  Patient denies any urinary symptoms other complaints at this time.   HPI  Past Medical History:  Diagnosis Date  . Arthritis    Knee  . Cancer ([place])    colon  . Carpal tunnel syndrome of right wrist   . Fatty liver    per Dr Radford Pax note  . GERD (gastroesophageal reflux disease)   . Hernia   . History of blood transfusion   . Hypertension   . Lower back pain   . Obesity   . Sleep apnea    severe per report 12/11  on chart  . Ventral hernia     Patient Active Problem List   Diagnosis Date Noted  . OSA (obstructive sleep apnea) 07/26/2017  . Partial small bowel obstruction ([place]) 12/27/2016  . 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 Colon cancer-right T3 N0, Oct 2007  10/06/2011  . Ventral hernia-midline 10/06/2011  . Obstructive sleep apnea of adult 10/06/2011  . Obesity BMI 61 10/06/2011    Past Surgical History:  Procedure Laterality Date  . BREATH TEK H PYLORI  11/07/2011   Procedure: BREATH TEK H PYLORI;  Surgeon: Shann Medal, MD;  Location: [facility];  Service: General;  Laterality: N/A;  . BREATH TEK H PYLORI  01/02/2012   Procedure: BREATH TEK H PYLORI;  Surgeon: Pedro Earls, MD;  Location: [facility];  Service: General;  Laterality: N/A;  . COLON SURGERY  06/2006   colectomy  . COLONOSCOPY N/A 05/13/2013   Procedure: COLONOSCOPY;  Surgeon: Winfield Cunas., MD;  Location: [facility];  Service: Endoscopy;  Laterality: N/A;  . COLONOSCOPY N/A 08/30/2017   Procedure: COLONOSCOPY;  Surgeon: Laurence Spates, MD;  Location: [facility];  Service: Endoscopy;  Laterality: N/A;  . LAPAROSCOPIC GASTRIC BANDING  05/07/2012   Procedure: LAPAROSCOPIC GASTRIC BANDING;  Surgeon: Pedro Earls, MD;  Location: [facility];  Service: General;  Laterality: N/A;        Home Medications    Prior to Admission medications   Medication Sig Start Date End Date Taking? Authorizing Provider  magnesium oxide (MAG-OX) 400 MG tablet Take 800 mg by mouth at bedtime.  [provider]  Multiple Vitamin (MULTIVITAMIN WITH MINERALS) TABS tablet Take 1 tablet by mouth at bedtime.    [provider]    Family History Family History  Problem Relation Age of Onset  . Cancer Mother        pancreatic  . Cancer Brother        colon  . Alzheimer's disease Father   . Depression Brother   . Cancer Maternal Aunt        breast    Social History Social History   Tobacco Use  . Smoking status: Never Smoker  . Smokeless tobacco: Never Used  Substance Use Topics  . Alcohol use: Yes    Alcohol/week: 1.0 standard drinks    Types: 1 Standard drinks or equivalent per week    Comment: wine  1 glass evry 2-3 months  . Drug use: No      Allergies   Patient has no known allergies.   Review of Systems Review of Systems  All other systems reviewed and are negative.    Physical Exam Updated Vital Signs BP 137/76 (BP Location: Right Arm)   Pulse 78   Temp 98.2 F (36.8 C) (Oral)   Resp (!) 22   Ht 6' (1.829 m)   Wt (!) 197.3 kg   SpO2 99%   BMI 59.00 kg/m   Physical Exam  Constitutional: He appears well-developed and well-nourished.  Morbidly obese male appears uncomfortable  HENT:  Head: Normocephalic and atraumatic.  Right Ear: External ear normal.  Left Ear: External ear normal.  Nose: Nose normal.  Mouth/Throat: Uvula is midline, oropharynx is clear and moist and mucous membranes are normal. No tonsillar exudate.  Eyes: Pupils are equal, round, and reactive to light. Right eye exhibits no discharge. Left eye exhibits no discharge. No scleral icterus.  Neck: Trachea normal. Neck supple. No spinous process tenderness present. No neck rigidity. Normal range of motion present.  Cardiovascular: Normal rate, regular rhythm and intact distal pulses.  No murmur heard. Pulses:      Radial pulses are 2+ on the right side, and 2+ on the left side.       Dorsalis pedis pulses are 2+ on the right side, and 2+ on the left side.       Posterior tibial pulses are 2+ on the right side, and 2+ on the left side.  No lower extremity swelling or edema. Calves symmetric in size bilaterally.  Pulmonary/Chest: Effort normal and breath sounds normal. He exhibits no tenderness.  Abdominal: Soft. Bowel sounds are decreased. There is tenderness in the epigastric area. There is guarding (voluntary). There is no rigidity, no rebound and no CVA tenderness.  Large ventral hernia in the epigastric area that is firm to the touch does not appear to be reducible.  He has notable tenderness of this area.  Exam is somewhat limited due to patient's body habitus. Large midline abdominal scar without surrounding signs of infection.    Musculoskeletal: He exhibits no edema.  Lymphadenopathy:    He has no cervical adenopathy.  Neurological: He is alert.  Skin: Skin is warm and dry. No rash noted.  Psychiatric: He has a normal mood and affect.  Nursing note and vitals reviewed.    ED Treatments / Results  Labs (all labs ordered are listed, but only abnormal results are displayed) Labs Reviewed  COMPREHENSIVE METABOLIC PANEL - Abnormal; Notable for the following components:      Result Value   Glucose, Bld 109 (*)  AST 51 (*)    ALT 80 (*)    All other components within normal limits  LIPASE, BLOOD  CBC  URINALYSIS, ROUTINE W REFLEX MICROSCOPIC  I-STAT TROPONIN, ED    EKG EKG Interpretation  Date/Time:  Thursday May 02 2018 21:18:58 EDT Ventricular Rate:  81 PR Interval:    QRS Duration: 92 QT Interval:  383 QTC Calculation: 445 R Axis:   72 Text Interpretation:  Sinus rhythm nonspecific st changes No significant change since last tracing Confirmed by Dorie Rank [phone]) on 05/02/2018 9:30:55 PM   Radiology Ct Abdomen Pelvis W Contrast  Result Date: 05/02/2018 CLINICAL DATA:  Ventral hernia. EXAM: CT ABDOMEN AND PELVIS WITH CONTRAST TECHNIQUE: Multidetector CT imaging of the abdomen and pelvis was performed using the standard protocol following bolus administration of intravenous contrast. CONTRAST:  141mL ISOVUE-300 IOPAMIDOL (ISOVUE-300) INJECTION 61% COMPARISON:  12/27/2016 FINDINGS: Lower chest: Heart is normal in size. No pericardial effusion. Bibasilar atelectasis. Small fat containing left-sided Bochdalek deck hernia. Hepatobiliary: Hepatic steatosis. No biliary dilatation. Normal gallbladder. Pancreas: Normal Spleen: Normal Adrenals/Urinary Tract: Stable approximately 2.2 cm hypodense left adrenal nodule. Stable left interpolar renal cyst. No additional renal lesions are identified. No hydronephrosis or nephrolithiasis. The urinary bladder is unremarkable for the degree of distention.  Stomach/Bowel: Gastric band device is in place. Partial colectomy. No inflammation. Please see comments regarding bowel containing ventral hernias below. Vascular/Lymphatic: No significant vascular findings are present. No enlarged abdominal or pelvic lymph nodes. Reproductive: Prostate is unremarkable. Other: Two adjacent ventral hernias are identified, both supraumbilical in location, the more caudal omental fat and small bowel containing hernia measures 5.8 cm transverse by 4.5 cm craniocaudad and demonstrates dilated fluid-filled small bowel and omental fat. Within the hernia, the fluid-filled small intestine measures up to 4.5 cm. More proximal upstream small bowel fluid-filled dilatation is identified leading into the hernia. No mural thickening or pneumatosis. More proximal upstream fluid-filled dilatation of bowel is identified secondary to this hernia. A larger omental fat, colon and small bowel containing hernia measuring 6.4 cm transverse by 6.5 cm craniocaudad demonstrates no significant bowel dilatation. This is seen just above the previously described small bowel containing hernia above. Musculoskeletal: Minimal grade 1 anterolisthesis of L4-5 likely on the basis of degenerative facet arthropathy. Moderate degenerative disc disease L5-S1. IMPRESSION: 1. Redemonstration of two separate midline supraumbilical ventral hernias, the more cephalad containing omental fat, small and large bowel without obstruction and the more caudad containing dilated loops of fluid-filled small bowel similar to prior measuring up to 4.5 cm within the hernia and with similar fluid-filled distention of more upstream small intestine leading into the hernia. No mural thickening or pneumatosis to suggest incarceration. 2. Hepatic steatosis. 3. 2.2 cm hypodensity compatible with a left adrenal adenoma. 4. Lower lumbar spondylosis. 5. Gastric band device in place. Electronically Signed   By: Ashley Royalty M.D.   On: 05/02/2018 23:01     Procedures Procedures (including critical care time)  Medications Ordered in ED Medications  iopamidol (ISOVUE-300) 61 % injection (has no administration in time range)  oxyCODONE-acetaminophen (PERCOCET/ROXICET) 5-325 MG per tablet 1 tablet (has no administration in time range)  morphine 4 MG/ML injection 4 mg (4 mg Intravenous Given 05/02/18 2133)  sodium chloride 0.9 % bolus 1,000 mL (1,000 mLs Intravenous New Bag/Given 05/02/18 2137)  ondansetron (ZOFRAN) injection 4 mg (4 mg Intravenous Given 05/02/18 2133)  iopamidol (ISOVUE-300) 61 % injection 100 mL (100 mLs Intravenous Contrast Given 05/02/18 2149)  HYDROmorphone (DILAUDID) injection 1  mg (1 mg Intravenous Given 05/02/18 2224)     Initial Impression / Assessment and Plan / ED Course  I have reviewed the triage vital signs and the nursing notes.  Pertinent labs & imaging results that were available during my care of the patient were reviewed by me and considered in my medical decision making (see chart for details).     52 y.o. male with a history of colon cancer status post bowel colectomy (2007) with a prior lap banding (2013) with a history of a ventral hernia for pain of her hernia.  Reports that the pain began earlier today.  He notes some nausea and dry heaving but no emesis.  He reports he has had complications with this hernia before including obstruction in 2018.  Patient notes he has not passed gas since that time.  He does increase pain while in the waiting room that caused him to become diaphoretic and brought back to the recess room.  On my evaluation the patient.  At home.  He denies any chest pain or shortness of breath.  EKG is without any significant change.  No evidence of STEMI.  Troponin checked and within normal limits.  His symptoms improved after pain control.  On initial exam patient does have noted ventral hernia in the epigastric area.  Area is firm and patient has voluntary guarding. Will obtain Ct scan to  evaluate. Pain medication and fluid given. Ice applied.   CT scan with noted midline supraumbilical ventral hernia without evidence of obstruction, incarceration or gangrene.  After pain medication if patient lies flat his hernia is reducible.  Hernia is redemonstrated when patient sits upright.  Given reassuring CT scan and ability to reduce the hernia in the department will discharge home.  Patient will follow-up with [facility] surgery as an outpatient.  He is to call tomorrow for follow-up.  Will provide course of pain medication at home.  Patient was reviewed in the [place] controlled substance database reporting system without any discrepancies found.  Strict return precautions were discussed.  Patient agreement with plan.  Final Clinical Impressions(s) / ED Diagnoses   Final diagnoses:  Ventral hernia without obstruction or gangrene    ED Discharge Orders         Ordered    oxyCODONE-acetaminophen (PERCOCET/ROXICET) 5-325 MG tablet  Every 4 hours PRN     05/02/18 2342    ondansetron (ZOFRAN) 4 MG tablet  Every 8 hours PRN     05/02/18 2342           Lorelle Gibbs 05/03/18 0029    Dorie Rank, MD 05/03/18 [phone]

## 2018-05-02 NOTE — Discharge Instructions (Signed)
You were seen here today for a hernia of your abdominal wall.  Your CT scan shows a ventral hernia without incarceration or obstruction. Please follow attached handout. Please call [facility] tomorrow to schedule a follow up appointment.  Get help right away if: You have a fever. You have abdominal pain that is getting worse. You feel nauseous or you vomit. You cannot push the hernia back in place by gently pressing on it while you are lying down. The hernia: Changes in shape or size. Is stuck outside the abdomen. Becomes discolored. Feels hard or tender.  For breakthrough pain you may take Percocet. Do not drink alcohol drive or operate heavy machinery when taking. You are being provided a prescription for opiates (also known as narcotics) for pain control on an as needed basis.  Opiates can be addictive and should only be used when absolutely necessary for pain control when other alternatives do not work.  We recommend you only use them for the recommended amount of time and only as prescribed.  Please do not take with other sedative medications or alcohol.  Please do not drive, operate machinery, or make important decisions while taking opiates.  Please note that these medications can be addictive and have high abuse potential.  Please keep these medications locked away from children, teenagers or any family members with history of substance abuse. Additionally, these medications may cause constipation - take over the counter stool softeners or add fiber to your diet to treat this (Metamucil, Psyllium Fiber, Colace, Miralax) Further refills will need to be obtained from your primary care doctor and will not be prescribed through the Emergency Department. You will test positive on most drug tests while taking this medication.

## 2018-05-03 LAB — URINALYSIS, ROUTINE W REFLEX MICROSCOPIC
BILIRUBIN URINE: NEGATIVE
GLUCOSE, UA: NEGATIVE mg/dL
HGB URINE DIPSTICK: NEGATIVE
KETONES UR: 5 mg/dL — AB
Leukocytes, UA: NEGATIVE
Nitrite: NEGATIVE
PH: 7 (ref 5.0–8.0)
PROTEIN: NEGATIVE mg/dL
Specific Gravity, Urine: 1.036 — ABNORMAL HIGH (ref 1.005–1.030)

## 2018-05-03 LAB — I-STAT CG4 LACTIC ACID, ED
Lactic Acid, Venous: 2.01 mmol/L (ref 0.5–1.9)
Lactic Acid, Venous: 1.69 mmol/L (ref 0.5–1.9)

## 2018-05-03 MED ORDER — MORPHINE SULFATE (PF) 4 MG/ML IV SOLN
4.0000 mg | Freq: Once | INTRAVENOUS | Status: AC
  Administered 2018-05-03: 4 mg via INTRAVENOUS
  Filled 2018-05-03: qty 1

## 2018-05-03 MED ORDER — SODIUM CHLORIDE 0.9 % IV BOLUS
1000.0000 mL | Freq: Once | INTRAVENOUS | Status: AC
  Administered 2018-05-03: 1000 mL via INTRAVENOUS

## 2018-05-03 MED ORDER — OXYCODONE-ACETAMINOPHEN 5-325 MG PO TABS
1.0000 | ORAL_TABLET | Freq: Once | ORAL | Status: AC
  Administered 2018-05-03: 1 via ORAL
  Filled 2018-05-03: qty 1

## 2018-05-03 MED ORDER — HYDROMORPHONE HCL 1 MG/ML IJ SOLN
1.0000 mg | Freq: Once | INTRAMUSCULAR | Status: AC
  Administered 2018-05-03: 1 mg via INTRAVENOUS
  Filled 2018-05-03: qty 1

## 2018-05-03 NOTE — ED Provider Notes (Signed)
Patient discharged by previous shift.  Asked to reevaluate patient by nursing staff due to his severe pain.  Patient with 2 ventral hernias that were reducible.  CT scan did not show any evidence of incarceration or strangulation.  Patient had nausea but no vomiting.  Patient concerned that his pain is uncontrolled and is going to return if he tries to go home.  Patient's presentation discussed with Dr. Marcello Moores of surgery.  She agrees the CT read is not consistent with obstruction.  There is no evidence of incarceration or strangulation.  Does not feel patient needs admission or NG tube decompression of the dilated loops in his caudad hernia.  She feels patient needs to follow-up in the office to have this electively repaired and does not need admission tonight.  Lactate is minimally elevated at 2.0.  We will aggressively hydrate and treat pain.  Lactate has normalized.  Patient is tolerating p.o. and his pain is improved.  He will be discharged per previous care team's plan.  He instructed to call surgery office first thing in the morning for an appointment.  Return to the ED with worsening pain, hernia does not reduce, vomiting, not passing gas or stool or any other concerns.   Ezequiel Essex, MD 05/03/18 [phone]

## 2018-08-20 ENCOUNTER — Other Ambulatory Visit (HOSPITAL_COMMUNITY): Payer: Self-pay | Admitting: Surgery

## 2018-08-27 ENCOUNTER — Ambulatory Visit (HOSPITAL_COMMUNITY)
Admission: RE | Admit: 2018-08-27 | Discharge: 2018-08-27 | Disposition: A | Discharge status: Home / Self Care | Payer: Managed Care, Other (non HMO) | Source: Ambulatory Visit | Attending: Surgery | Admitting: Surgery

## 2018-08-30 ENCOUNTER — Encounter: Payer: Self-pay | Admitting: Dietician

## 2018-08-30 ENCOUNTER — Encounter: Payer: Managed Care, Other (non HMO) | Attending: Surgery | Admitting: Dietician

## 2018-08-30 VITALS — Ht 73.0 in | Wt >= 6400 oz

## 2018-08-30 DIAGNOSIS — K219 Gastro-esophageal reflux disease without esophagitis: Secondary | ICD-10-CM | POA: Diagnosis not present

## 2018-08-30 DIAGNOSIS — Z713 Dietary counseling and surveillance: Secondary | ICD-10-CM | POA: Insufficient documentation

## 2018-08-30 DIAGNOSIS — E669 Obesity, unspecified: Secondary | ICD-10-CM

## 2018-08-30 DIAGNOSIS — I10 Essential (primary) hypertension: Secondary | ICD-10-CM | POA: Insufficient documentation

## 2018-08-30 DIAGNOSIS — Z9884 Bariatric surgery status: Secondary | ICD-10-CM | POA: Insufficient documentation

## 2018-08-30 DIAGNOSIS — Z6841 Body Mass Index (BMI) 40.0 and over, adult: Secondary | ICD-10-CM | POA: Diagnosis not present

## 2018-08-30 DIAGNOSIS — G4733 Obstructive sleep apnea (adult) (pediatric): Secondary | ICD-10-CM | POA: Diagnosis not present

## 2018-08-30 NOTE — Progress Notes (Signed)
Bariatric Pre-Op Nutrition Assessment for Planned Sleeve Gastrectomy Surgery Medical Nutrition Therapy  Appt Start Time: 8:05am  End time: 9:05am  Patient was seen on 08/30/2018 for Pre-Operative Nutrition Assessment. Assessment and letter of approval faxed to [facility] Surgery Bariatric Surgery Program coordinator on 08/30/2018.   Pt expectation of surgery: To act as a tool to facilitate weight loss.  Pt expectation of dietitian: To provide guidance for how to eat.  Anthropometrics  Start weight at [facility]: 421.6 lbs Height: 73 in BMI: 55.6 kg/m2    Clinical  Medical Hx: obesity, high blood pressure, sleep apnea, GERD, colon cancer, hernias Surgeries: lap band (2013), colectomy (2007)  Allergies: N/A Medications: amlodipine, chlorthalidone  Psychosocial/Lifestyle Pt works as a Psychologist, occupational. Pt has 3 children living at home with him and his wife, 1 of which has special needs. Pt states he has experienced some feelings of depression, but he is seeing a therapist. Pt has a hx of gastric surgery (lap band in 2013) which was unsuccessful for him. Pt states that, looking back, it was an inappropriate time for him to have surgery and now believes that the gastric sleeve is more appropriate for him.    24-Hr Dietary Recall First Meal: 2 boiled eggs + 2 sausage links OR oatmeal + banana + walnuts OR Slim Fast Snack: granola peanut butter bar OR grapes OR apples  Second Meal: sometimes skips OR chicken salad + crackers OR [employer]'s 4 for 4 Snack: usually skips OR grapes OR peanut butter + banana OR Activia yogurt  Third Meal: rice pilaf + sauteed spinach/onions + air fried chicken/pork chop  Snack: none Beverages: water + sometimes lemonade + sometimes 100% cranberry juice + rarely sodas + milk  Food & Nutrition Related Hx Dietary Hx: Pt states he is a stress eater. Pt states he sometimes misses breakfast or lunch dt busy schedule. Pt states he has an air fryer he and his wife  use at home, and they typically eat dinner pretty late which is also often a very large meal. Pt states he already avoids drinking while eating. Pt states he does not drink alcohol or caffeine. Pt states he drinks 32 ounces of water in the morning before breakfast. Pt downloaded the [employer] app during today's apt. Estimated Daily Fluid Intake: > 64 oz GI / Other Notable Symptoms: Acid reflux caused by lap band. Pt states he can recognize certain foods that worsen reflux and he knows some strategies to alleviate this (such as sleeping in certain positions, drinking milk, etc.)    Physical Activity  Current average weekly physical activity: walking   Estimated Energy Needs Calories: 2200 Carbohydrate: 248g Protein: 165g Fat: 61g  Pre-Op Goals Reviewed with the Patient . Track food and beverage intake (try MyFitness Pal or the [employer]it[employer] app) . Make healthy food choices . Avoid concentrated sugars and fried foods . Keep fat & sugar in the single digits per serving on food labels . Practice CHEWING your food (aim for applesauce consistency) . Practice not drinking 15 minutes before, during, and 30 minutes after each meal and snack . Avoid all carbonated beverages (ex: soda, sparkling beverages)  . Limit caffeinated beverages (ex: coffee, tea, energy drinks) . Avoid all sugar-sweetened beverages (ex: regular soda, sports drinks)  . Avoid alcohol  . Consume 3 meals per day or try to eat every 3-5 hours . Make a list of non-food related activities . Aim for 64-100 ounces of FLUID daily (with at least half of fluid intake  being plain water)  . Aim for at least 60-80 grams of PROTEIN daily . Look for a liquid protein source that contains ?15 g protein and ?5 g carbohydrate (ex: shakes, drinks, shots) . Physical activity is an important part of a healthy lifestyle so keep it moving! The goal is to reach 150 minutes of exercise per week, including cardiovascular and weight baring  activity.  *Goals that are bolded indicate the pt would like to start working towards these  Handouts Provided Include  . Pre-Op Goals . Bariatric Surgery Protein Shakes  Learning Style & Readiness for Change Teaching method utilized: Visual & Auditory  Demonstrated degree of understanding via: Teach Back  Barriers to learning/adherence to lifestyle change: None Identified  Next Steps Supervised Weight Loss (SWL) Visits Needed: 6  RD's Notes for Next SWL Visit:   Pt needs Bariatric Vitamins & Minerals Recommendations handout   Provide Bariatric Tips for Eating Out handout   Assess progress made on goals previously set and identify 1-2 more to start working on   Provide guidance for incorporating healthy, filling snacks to help achieve goal of eating every 3-5 hours and to avoid eating large nighttime meal (which will also address acid reflux concerns)    Patient is to call [facility] to enroll in Pre-Op Class (>2 weeks before surgery) and Post-Op Class (2 weeks after surgery) for further nutrition education when surgery date is scheduled.   Patient is to return to [facility] in 1 month for 1st SWL visit.

## 2018-08-30 NOTE — Patient Instructions (Signed)
.   Practice CHEWING your food (aim for applesauce consistency) . Consume 3 meals per day or try to eat every 3-5 hours . Make a list of non-food related activities . Aim for at least 60-80 grams of PROTEIN daily

## 2018-10-01 ENCOUNTER — Encounter: Payer: Self-pay | Admitting: Dietician

## 2018-10-01 ENCOUNTER — Encounter: Payer: BLUE CROSS/BLUE SHIELD | Attending: Surgery | Admitting: Dietician

## 2018-10-01 VITALS — Wt >= 6400 oz

## 2018-10-01 DIAGNOSIS — Z6841 Body Mass Index (BMI) 40.0 and over, adult: Secondary | ICD-10-CM | POA: Insufficient documentation

## 2018-10-01 DIAGNOSIS — I10 Essential (primary) hypertension: Secondary | ICD-10-CM | POA: Diagnosis not present

## 2018-10-01 DIAGNOSIS — K219 Gastro-esophageal reflux disease without esophagitis: Secondary | ICD-10-CM | POA: Diagnosis not present

## 2018-10-01 DIAGNOSIS — E669 Obesity, unspecified: Secondary | ICD-10-CM | POA: Diagnosis not present

## 2018-10-01 DIAGNOSIS — Z713 Dietary counseling and surveillance: Secondary | ICD-10-CM | POA: Diagnosis not present

## 2018-10-01 DIAGNOSIS — Z9884 Bariatric surgery status: Secondary | ICD-10-CM | POA: Diagnosis not present

## 2018-10-01 DIAGNOSIS — G4733 Obstructive sleep apnea (adult) (pediatric): Secondary | ICD-10-CM | POA: Diagnosis not present

## 2018-10-01 NOTE — Patient Instructions (Signed)
New Pre-Op Goals to Work On  Aim for 64-100 ounces of FLUID daily (with at least half of fluid intake being plain water)

## 2018-10-01 NOTE — Progress Notes (Signed)
Bariatric Supervised Weight Loss Visit Appt Start Time: 7:35am  End Time: 8:00am  Planned Surgery: Sleeve Gastrectomy   1st out of 6 SWL Appointments   NUTRITION ASSESSMENT  Anthropometrics  Start weight at [facility]: 421.6 lbs (08/30/2018) Today's weight: 416.6 lbs Weight change: -5 lbs since previous visit on 08/30/2018 BMI: 55 kg/m2    Clinical  Medical Hx: obesity, high blood pressure, sleep apnea, GERD, colon cancer, hernias Surgeries: lap band (2013), colectomy (2007) Medications: amlodipine, chlorthalidone   Psychosocial/Lifestyle Pt works as a Psychologist, occupational. Pt has 3 children living at home with him and his wife, 1 of which has special needs. Pt states he has experienced some feelings of depression, but he is seeing a therapist. Pt has a hx of gastric surgery (lap band in 2013) which was unsuccessful for him. Pt states that, looking back, it was an inappropriate time for him to have surgery and now believes that the gastric sleeve is more appropriate for him.   24-Hr Dietary Recall First Meal: 2 eggs + 2 sausage/bacon (+ grits sometimes) Snack: banana + peanut butter (or breakfast bar) Second Meal: [employer] 4 for $4 (or wings) Snack: none Third Meal: sauteed spinach (or salad) + rice + hamburger Snack: ice cream Beverages: water  Food & Nutrition Related Hx Dietary Hx:  -From 08/30/2018: Pt states he is a stress eater. Pt states he sometimes misses breakfast or lunch dt busy schedule. Pt states he has an air fryer he and his wife use at home, and they typically eat dinner pretty late which is also often a very large meal. Pt states he already avoids drinking while eating. Pt states he does not drink alcohol or caffeine. Pt states he drinks 32 ounces of water in the morning before breakfast. Pt downloaded the [employer] app during today's apt. -Update 10/01/2018: Pt states he still eats late at night sometimes. Pt states he believes he needs to do better at drinking  more water, and has made progress by structuring it around his devotional time in the morning (drinks 16 oz before + 16 oz after) then throughout the day. Pt states he has been eating less sweets by keeping them out of the house. Pt states he is an emotional eater, and will often eat ice cream or frozen yogurt late at night (10pm) after dinner.  Estimated Daily Fluid Intake: 32-48 oz water GI / Other Notable Symptoms: none stated (previously, pt stated acid reflux caused by lap band. Pt states he can recognize certain foods that worsen reflux and he knows some strategies to alleviate this such as sleeping in certain positions, drinking milk, etc.)  Physical Activity  Current average weekly physical activity: walking  Estimated Energy Needs Calories: 2200 Carbohydrate: 248g Protein: 165g Fat: 61g  Progress Towards Pre-Op Goals Previously Chosen . Chewing food thoroughly is still progressing, but making conscious efforts.  . Eating 3+ times per day.  . Estimated to be reaching 80g protein goal. . Continues to eat a large dinner meal late at night plus dessert afterwards.    NUTRITION DIAGNOSIS  Overweight/obesity ([place]-3.3) related to past poor dietary habits and physical inactivity as evidenced by patient w/ planned Sleeve Gastrectomy surgery following dietary guidelines for continued weight loss.    NUTRITION INTERVENTION  Nutrition counseling (C-1) and education (E-2) to facilitate bariatric surgery goals.  New Pre-Op Goals to Work On  Aim for 64-100 ounces of FLUID daily (with at least half of fluid intake being plain water)   Handouts Provided  Include   Bariatric Surgery Nutrition Visits   Bariatric Surgery Support Group 2020 Schedule   Low Carbohydrate Snack Suggestions   Bariatric Tips for Eating Out   Learning Style & Readiness for Change Teaching method utilized: Visual & Auditory  Demonstrated degree of understanding via: Teach Back  Barriers to learning/adherence to  lifestyle change: Contemplative Stage of Change (emotional barriers, such as stress eating and emotional eating)    MONITORING & EVALUATION Dietary intake, weekly physical activity, body weight, and pre-op goals in 1 month.   Next Steps  Notes for next SWL Visit:   Provide information about BELT (per pt request)   Vitamins and Mineral Recommendations handout   Assess progress made on goals previously chosen and choose 1-2 more to begin working on  Provide protein shake coupons   Further stress the importance of eating more throughout the day to avoid large meal and dessert late at night (which will also address reflux concerns)  Patient is to return to [facility] in 1 month for next SWL visit.  *Patient stated his insurance has changed, which may impact the number of SWL visits he is required to attend.

## 2018-10-04 ENCOUNTER — Ambulatory Visit: Payer: Self-pay | Admitting: Dietician

## 2018-10-15 ENCOUNTER — Telehealth: Payer: Self-pay | Admitting: Nurse Practitioner

## 2018-10-15 NOTE — Telephone Encounter (Signed)
Error

## 2018-10-31 ENCOUNTER — Encounter: Payer: Self-pay | Admitting: Dietician

## 2018-10-31 ENCOUNTER — Encounter: Payer: BLUE CROSS/BLUE SHIELD | Attending: Surgery | Admitting: Dietician

## 2018-10-31 VITALS — Wt >= 6400 oz

## 2018-10-31 DIAGNOSIS — Z6841 Body Mass Index (BMI) 40.0 and over, adult: Secondary | ICD-10-CM | POA: Insufficient documentation

## 2018-10-31 DIAGNOSIS — I10 Essential (primary) hypertension: Secondary | ICD-10-CM | POA: Insufficient documentation

## 2018-10-31 DIAGNOSIS — G4733 Obstructive sleep apnea (adult) (pediatric): Secondary | ICD-10-CM | POA: Insufficient documentation

## 2018-10-31 DIAGNOSIS — K219 Gastro-esophageal reflux disease without esophagitis: Secondary | ICD-10-CM | POA: Insufficient documentation

## 2018-10-31 DIAGNOSIS — Z9884 Bariatric surgery status: Secondary | ICD-10-CM | POA: Diagnosis not present

## 2018-10-31 DIAGNOSIS — E669 Obesity, unspecified: Secondary | ICD-10-CM

## 2018-10-31 DIAGNOSIS — Z713 Dietary counseling and surveillance: Secondary | ICD-10-CM | POA: Diagnosis not present

## 2018-10-31 NOTE — Patient Instructions (Signed)
   Track food and beverage intake at least 1-2 days per week.   Continue to aim for 64-100 ounces of FLUID daily (with at least half of fluid intake being plain water.)   Continue eating 3+ times per day.   Keep chewing!  GREAT progress thus far, keep up the good work! Use the snack ideas and resources ideas sheet for new snacks and apps to help track your food/beverage intake.   See you next month!

## 2018-10-31 NOTE — Progress Notes (Signed)
Bariatric Supervised Weight Loss Visit Appt Start Time: 8:10am  End Time: 8:25am  Planned Surgery: Sleeve Gastrectomy   2nd out of 6 SWL Appointments   NUTRITION ASSESSMENT  Anthropometrics  Start weight at [facility]: 421.6 lbs (08/30/2018) Today's weight: 416 lbs Weight change: -0.6 lbs since previous visit on 10/01/2018 BMI: 54.9 kg/m2    Clinical  Medical Hx: obesity, high blood pressure, sleep apnea, GERD, colon cancer, hernias Surgeries: lap band (2013), colectomy (2007) Medications: amlodipine, chlorthalidone   Psychosocial/Lifestyle Pt works as a Psychologist, occupational. Pt has 3 children living at home with him and his wife, 1 of which has special needs. Pt states he has experienced some feelings of depression, but he is seeing a therapist. Pt has a hx of gastric surgery (lap band in 2013) which was unsuccessful for him. Pt states that, looking back, it was an inappropriate time for him to have surgery and now believes that the gastric sleeve is more appropriate for him.   Pt states his niece recently had a heart attack and has been on life support, so he and his family have been busy traveling back and forth to [facility] throughout the past few days visiting family.   24-Hr Dietary Recall First Meal: Slim Fast shake (or 2 eggs + 2 sausage/bacon (+ grits sometimes) Snack: banana + peanut butter (or breakfast bar) Second Meal: salad (or Wendy's 4 for $4, or wings, or [employer]) Snack: none Third Meal: sauteed spinach (or salad) + rice + hamburger Snack: banana + peanut butter Beverages: water, diet soda  Food & Nutrition Related Hx Dietary Hx:  Pt states he is a stress eater, but has consciously been making better food choices when he is stressed. Pt states he sometimes misses breakfast or lunch dt busy schedule, but is getting better about not missing meals. Pt states he has an air fryer he and his wife use at home, and they typically eat dinner pretty late which is also often a  very large meal. Pt states he already avoids drinking while eating. Pt states he does not drink alcohol or caffeine. Pt states he drinks 32 ounces of water in the morning before breakfast. Pt downloaded the [employer] app. Pt states he still eats late at night sometimes but has been eating dinner earlier in the evening more often. Pt states he believes he needs to do better at drinking more water, and has made progress by structuring it around his devotional time in the morning (drinks 16 oz before + 16 oz after) then throughout the day. Pt states he has been eating less sweets by keeping them out of the house. Pt states he is an emotional eater, and used to eat ice cream or frozen yogurt late at night (10pm) after dinner. Pt states now he will eat a banana with peanut butter if he has a snack after dinner.   Estimated Daily Fluid Intake: 32-48 oz water + 12-16 oz diet soda  GI / Other Notable Symptoms: none stated (previously, pt stated acid reflux caused by lap band. Pt states he can recognize certain foods that worsen reflux and he knows some strategies to alleviate this such as sleeping in certain positions, drinking milk, etc.)  Physical Activity  Current average weekly physical activity: walking (increasing weekly)   Estimated Energy Needs Calories: 2200 Carbohydrate: 248g Protein: 165g Fat: 61g  Progress Towards Pre-Op Goals Previously Chosen . Chewing food thoroughly is still progressing, but making conscious efforts.  . Eating 3+ times per day.  Marland Kitchen  Estimated to be reaching or getting close to 80g protein goal. . Dinner meal is eaten a little earlier in the evening, not as large, and if snack/dessert afterwards it is banana with peanut butter instead of ice cream.     NUTRITION DIAGNOSIS  Overweight/obesity ([place]-3.3) related to past poor dietary habits and physical inactivity as evidenced by patient w/ planned Sleeve Gastrectomy surgery following dietary guidelines for continued weight  loss.    NUTRITION INTERVENTION  Nutrition counseling (C-1) and education (E-2) to facilitate bariatric surgery goals.  New Pre-Op Goals to Work On  [employer] and beverage intake.   Handouts Provided Include   Bariatric Snack Ideas   Post-Op Resources (Apps and Websites)  Learning Style & Readiness for Change Teaching method utilized: Visual & Auditory  Demonstrated degree of understanding via: Teach Back  Barriers to learning/adherence to lifestyle change: Contemplative Stage of Change (emotional barriers, such as stress eating and emotional eating)    MONITORING & EVALUATION Dietary intake, weekly physical activity, body weight, and pre-op goals in 1 month.   Next Steps  Notes for next SWL Visit:   Vitamins and Mineral Recommendations handout   Assess progress made on goals previously chosen and choose 1-2 more to begin working on  Further stress the importance of eating more throughout the day to avoid large meal and dessert late at night (which will also address reflux concerns)  Patient is to return to [facility] in 1 month for next SWL visit.  *Patient stated during his visit on 10/01/2018 that his insurance has changed, which may impact the number of SWL visits he is required to attend. No updates as of 10/31/2018.

## 2018-11-20 ENCOUNTER — Other Ambulatory Visit: Payer: Self-pay | Admitting: Cardiology

## 2018-11-22 ENCOUNTER — Encounter: Payer: Self-pay | Admitting: Cardiology

## 2018-11-22 ENCOUNTER — Ambulatory Visit (INDEPENDENT_AMBULATORY_CARE_PROVIDER_SITE_OTHER): Payer: BLUE CROSS/BLUE SHIELD | Admitting: Cardiology

## 2018-11-22 VITALS — BP 132/78 | HR 78 | Ht 72.0 in | Wt >= 6400 oz

## 2018-11-22 DIAGNOSIS — I10 Essential (primary) hypertension: Secondary | ICD-10-CM

## 2018-11-22 NOTE — Progress Notes (Signed)
Patient is here for follow up visit.  Subjective:   Cameron Jones, male    DOB: Sep 03, 1966, 53 y.o.   MRN: [id number]   Chief Complaint  Patient presents with  . Hypertension  . Follow-up    6 MTH, LAST EKG 05/20/18     HPI  53 year old African-American male with morbid obesity s/p lab band surgery in 2015, h/o colon cancer s/p resection in 2005, hypertension, here for 6 month follow-up.  At last visit, I had started chlorthalidone 25 mg daily.  His blood pressure is very well controlled.  He denies chest pain, shortness of breath, palpitations, leg edema, orthopnea, PND, TIA/syncope.  He is working with a nutritionist and is considering repeat weight loss surgery.   Past Medical History:  Diagnosis Date  . Arthritis    Knee  . Cancer ([place])    colon  . Carpal tunnel syndrome of right wrist   . Fatty liver    per Dr Radford Pax note  . GERD (gastroesophageal reflux disease)   . Hernia   . History of blood transfusion   . Hypertension   . Lower back pain   . Obesity   . Sleep apnea    severe per report 12/11  on chart  . Ventral hernia      Past Surgical History:  Procedure Laterality Date  . BREATH TEK H PYLORI  11/07/2011   Procedure: BREATH TEK H PYLORI;  Surgeon: Shann Medal, MD;  Location: [facility];  Service: General;  Laterality: N/A;  . BREATH TEK H PYLORI  01/02/2012   Procedure: BREATH TEK H PYLORI;  Surgeon: Pedro Earls, MD;  Location: [facility];  Service: General;  Laterality: N/A;  . COLON SURGERY  06/2006   colectomy  . COLONOSCOPY N/A 05/13/2013   Procedure: COLONOSCOPY;  Surgeon: Winfield Cunas., MD;  Location: [facility];  Service: Endoscopy;  Laterality: N/A;  . COLONOSCOPY N/A 08/30/2017   Procedure: COLONOSCOPY;  Surgeon: Laurence Spates, MD;  Location: [facility];  Service: Endoscopy;  Laterality: N/A;  . LAPAROSCOPIC GASTRIC BANDING  05/07/2012   Procedure: LAPAROSCOPIC GASTRIC BANDING;  Surgeon: Pedro Earls, MD;   Location: [facility];  Service: General;  Laterality: N/A;     Social History   Socioeconomic History  . Marital status: Married    Spouse name: Not on file  . Number of children: 4  . Years of education: Not on file  . Highest education level: Not on file  Occupational History  . Not on file  Social Needs  . Financial resource strain: Not on file  . Food insecurity:    Worry: Not on file    Inability: Not on file  . Transportation needs:    Medical: Not on file    Non-medical: Not on file  Tobacco Use  . Smoking status: Never Smoker  . Smokeless tobacco: Never Used  Substance and Sexual Activity  . Alcohol use: Yes    Alcohol/week: 1.0 standard drinks    Types: 1 Standard drinks or equivalent per week    Comment: wine  1 glass evry 2-3 months  . Drug use: No  . Sexual activity: Yes    Birth control/protection: None  Lifestyle  . Physical activity:    Days per week: Not on file    Minutes per session: Not on file  . Stress: Not on file  Relationships  . Social connections:    Talks on phone: Not on file  Gets together: Not on file    Attends religious service: Not on file    Active member of club or organization: Not on file    Attends meetings of clubs or organizations: Not on file    Relationship status: Not on file  . Intimate partner violence:    Fear of current or ex partner: Not on file    Emotionally abused: Not on file    Physically abused: Not on file    Forced sexual activity: Not on file  Other Topics Concern  . Not on file  Social History Narrative  . Not on file     Current Outpatient Medications on File Prior to Visit  Medication Sig Dispense Refill  . amLODipine (NORVASC) 10 MG tablet Take 10 mg by mouth daily.    . chlorthalidone (HYGROTON) 25 MG tablet TAKE 1 TABLET BY MOUTH EVERY DAY 90 tablet 1  . LOSARTAN POTASSIUM PO Take 25 mg by mouth daily.    . Multiple Vitamins-Minerals (MULTIVITAMIN WITH MINERALS) tablet Take 4 tablets by mouth  daily. gummies    . KLOR-CON M20 20 MEQ tablet Take 2 tablets by mouth daily.    Marland Kitchen losartan (COZAAR) 25 MG tablet Take 25 mg by mouth daily.    . ondansetron (ZOFRAN) 4 MG tablet Take 1 tablet (4 mg total) by mouth every 8 (eight) hours as needed for nausea or vomiting. (Patient not taking: Reported on 11/22/2018) 4 tablet 0  . oxyCODONE-acetaminophen (PERCOCET/ROXICET) 5-325 MG tablet Take 2 tablets by mouth every 4 (four) hours as needed for severe pain. (Patient not taking: Reported on 11/22/2018) 12 tablet 0   No current facility-administered medications on file prior to visit.     Cardiovascular studies:   EKG 11/22/2018: Sinus  Rhythm  Normal EKG   Labs 07/03/2018: Glucose 102.  BUN/creatinine 15/0.98.  EGFR normal.  Sodium 140, potassium 3.7.  Labs 06/17/2018: Glucose 106. BUN/Cr 15/1.08. eGFR 80. Na/K 140/3.3  Labs 05/14/2018: Glucose 106.  BUN/creatinine 11/1.0.  EGFR 79/95.  Sodium 141, potassium 4.2. ALT 66, mildly elevated H/H 15/50. MCV 89. Platelets 212.  Chol 173, TG 123, HDL 40, LDL 108  Review of Systems  Constitution: Negative for decreased appetite, malaise/fatigue, weight gain and weight loss.  HENT: Negative for congestion.   Eyes: Negative for visual disturbance.  Cardiovascular: Negative for chest pain, claudication, dyspnea on exertion, leg swelling, palpitations and syncope.  Respiratory: Negative for shortness of breath.   Endocrine: Negative for cold intolerance.  Hematologic/Lymphatic: Does not bruise/bleed easily.  Skin: Negative for itching and rash.  Musculoskeletal: Negative for myalgias.  Gastrointestinal: Negative for abdominal pain, nausea and vomiting.  Genitourinary: Negative for dysuria.  Neurological: Negative for dizziness and weakness.  Psychiatric/Behavioral: The patient is not nervous/anxious.   All other systems reviewed and are negative.      Objective:    Vitals:   11/22/18 0852  BP: 132/78  Pulse: 78  SpO2: 96%      Physical Exam  Constitutional: He is oriented to person, place, and time. He appears well-developed and well-nourished. No distress.  Morbidly obese  HENT:  Head: Normocephalic and atraumatic.  Eyes: Pupils are equal, round, and reactive to light. Conjunctivae are normal.  Neck: No JVD present.  Cardiovascular: Normal rate, regular rhythm and intact distal pulses.  No murmur heard. Pulmonary/Chest: Effort normal and breath sounds normal. He has no wheezes. He has no rales.  Abdominal: Soft. Bowel sounds are normal. There is no rebound.  Laparotomy scar  Musculoskeletal:        General: No edema.  Lymphadenopathy:    He has no cervical adenopathy.  Neurological: He is alert and oriented to person, place, and time. No cranial nerve deficit.  Skin: Skin is warm and dry.  Psychiatric: He has a normal mood and affect.  Nursing note and vitals reviewed.       Assessment & Recommendations:   53 year old African-American male with morbid obesity s/p lab band surgery in 2015, h/o colon cancer s/p resection in 2005, hypertension, here for 6 month follow-up.   Hypertension: Well-controlled.  Continue current antihypertensive therapy.   Morbid obesity: Continue working with nutritionist and weight loss Psychologist, sport and exercise.  In future, if he decides to proceed with weight loss surgery, recommend echocardiogram for baseline cardiac function assessment.  Given his lack of symptoms of angina or angina equivalent, I do not think he needs stress testing.  OSA: Continue CPAP use  I will see him back in 1 year.  Nigel Mormon, MD [facility] Pager: [phone] Office: [phone] If no answer Cell [phone]

## 2018-11-23 ENCOUNTER — Encounter: Payer: Self-pay | Admitting: Cardiology

## 2018-11-23 DIAGNOSIS — I10 Essential (primary) hypertension: Secondary | ICD-10-CM | POA: Insufficient documentation

## 2018-11-29 ENCOUNTER — Encounter: Payer: Self-pay | Admitting: Dietician

## 2018-11-29 ENCOUNTER — Encounter: Payer: BLUE CROSS/BLUE SHIELD | Attending: Internal Medicine | Admitting: Dietician

## 2018-11-29 VITALS — Wt >= 6400 oz

## 2018-11-29 DIAGNOSIS — E669 Obesity, unspecified: Secondary | ICD-10-CM

## 2018-11-29 DIAGNOSIS — K219 Gastro-esophageal reflux disease without esophagitis: Secondary | ICD-10-CM | POA: Diagnosis not present

## 2018-11-29 DIAGNOSIS — I10 Essential (primary) hypertension: Secondary | ICD-10-CM | POA: Diagnosis not present

## 2018-11-29 DIAGNOSIS — G4733 Obstructive sleep apnea (adult) (pediatric): Secondary | ICD-10-CM | POA: Insufficient documentation

## 2018-11-29 DIAGNOSIS — Z9884 Bariatric surgery status: Secondary | ICD-10-CM | POA: Diagnosis not present

## 2018-11-29 DIAGNOSIS — Z6841 Body Mass Index (BMI) 40.0 and over, adult: Secondary | ICD-10-CM | POA: Insufficient documentation

## 2018-11-29 DIAGNOSIS — Z713 Dietary counseling and surveillance: Secondary | ICD-10-CM | POA: Diagnosis not present

## 2018-11-29 NOTE — Patient Instructions (Addendum)
Reach out to insurance company about nutrition visits required prior to surgery and anything else that may have changed since switching companies.   Continue working through your Pre-Op Goals sheet. Keep up the great work! Focus on the following this coming month:   Physical activity- keep it moving! The goal is 150 minutes per week.

## 2018-11-29 NOTE — Progress Notes (Signed)
Bariatric Supervised Weight Loss Visit Appt Start Time: 8:20am  End Time: 8:40am  Planned Surgery: Sleeve Gastrectomy   3rd out of 6 SWL Appointments   NUTRITION ASSESSMENT  Anthropometrics  Start weight at [facility]: 421.6 lbs (08/30/2018) Today's weight: 419.9 lbs Weight change: +3.9 lbs since previous visit on 10/31/2018 BMI: 56.95 kg/m2    Clinical  Medical Hx: obesity, high blood pressure, sleep apnea, GERD, colon cancer, hernias Surgeries: lap band (2013), colectomy (2007) Medications: amlodipine, chlorthalidone   Psychosocial/Lifestyle Pt works as a Psychologist, occupational. Pt has 3 children living at home with him and his wife, 1 of which has special needs. Pt states he has experienced some feelings of depression, but he is seeing a therapist. Pt has a hx of gastric surgery (lap band in 2013) which was unsuccessful for him. Pt states that, looking back, it was an inappropriate time for him to have surgery and now believes that the gastric sleeve is more appropriate for him.   Pt states his niece who recently had a heart attack and was on life support passed away within the last month, which has been hard for him and his family. Pt states work has also been very busy and especially stressful lately, and involves sitting through long meetings, traveling/riding in the car for long periods of time, and is overall very sedentary.    24-Hr Dietary Recall First Meal: Slim Fast shake (or 2 eggs + 2 sausage/bacon + grits sometimes) Snack: pretzels (or breakfast bar) Second Meal: skips (or pretzels/popcorn + banana + peanut butter + oranges, or salad, or wrap) Snack: skips (or Slim Fast shake) Third Meal: sauteed spinach (or salad, or broccoli) + rice + chicken Snack: banana + peanut butter Beverages: water, flavored water, sweet tea  Food & Nutrition Related Hx Dietary Hx:  Pt states he is a stress eater, but has consciously been making better food choices when he is stressed.  Previously pt would skip breakfast and/or lunch dt busy work schedule which improved for a while, but now back to skipping lunch several times throughout the week. For example, pt states he does not eat during training, which he has ~3 days per week and lasts ~7 hours/day. Pt states he has an air fryer he and his wife use at home, and they are getting better about not eating too late/too large of a meal in the evening. Pt states he already avoids drinking while eating. Pt states he does not drink alcohol or caffeine. Pt downloaded the [place] app and now tracks fluid with Fit[employer] app. Pt states he believes he needs to do better at drinking more water, and is considering structuring it around his devotional time in the morning (16 oz before + 16 oz after) then throughout the day. Pt states he has been eating less sweets by keeping them out of the house. Pt states he is an emotional eater, and used to eat ice cream or frozen yogurt late at night (10pm) after dinner. Pt states now he will eat a banana with peanut butter if he has a snack after dinner.  Pt states he has been doing more Slim Fast protein shakes (10g protein.)  Estimated Daily Fluid Intake: ~32 oz water   GI / Other Notable Symptoms: none stated (previously, pt stated acid reflux caused by lap band. Pt states he can recognize certain foods that worsen reflux and he knows some strategies to alleviate this such as sleeping in certain positions, drinking milk, etc.)  Physical Activity  Current average weekly physical activity: walking    Estimated Energy Needs Calories: 2200 Carbohydrate: 248g Protein: 165g Fat: 61g  Progress Towards Pre-Op Goals Previously Chosen . Chewing food thoroughly is still progressing, but making conscious efforts.  . Eating 3+ times per day.  . Estimated to be reaching or getting close to 80g protein goal. . Dinner meal is eaten a little earlier in the evening, not as large, and if snack/dessert afterwards it  is banana with peanut butter instead of ice cream.   . Not drinking sodas/diet sodas.  . Tracking fluid intake on Fit[employer] app.    NUTRITION DIAGNOSIS  Overweight/obesity ([place]-3.3) related to past poor dietary habits and physical inactivity as evidenced by patient w/ planned Sleeve Gastrectomy surgery following dietary guidelines for continued weight loss.    NUTRITION INTERVENTION  Nutrition counseling (C-1) and education (E-2) to facilitate bariatric surgery goals.  New Pre-Op Goals to Work On  Physical activity    Handouts Provided Include   Vitamins & Minerals   Learning Style & Readiness for Change Teaching method utilized: Visual & Auditory  Demonstrated degree of understanding via: Teach Back  Barriers to learning/adherence to lifestyle change: Contemplative Stage of Change (emotional barriers, such as stress eating and emotional eating)    MONITORING & EVALUATION Dietary intake, weekly physical activity, body weight, and pre-op goals in 1 month.   Next Steps  Notes for next SWL Visit:   Assess progress made on goals previously chosen and choose 1-2 more to begin working on  Further stress the importance of eating more throughout the day to avoid large meal and dessert late at night (which will also address reflux concerns)  Patient is to return to [facility] in 1 month for next SWL visit.  *Patient stated during his visit on 10/01/2018 that his insurance has changed, which may impact the number of SWL visits he is required to attend. No updates as of 11/29/2018.

## 2018-12-04 DIAGNOSIS — R42 Dizziness and giddiness: Secondary | ICD-10-CM | POA: Diagnosis not present

## 2018-12-04 DIAGNOSIS — J019 Acute sinusitis, unspecified: Secondary | ICD-10-CM | POA: Diagnosis not present

## 2018-12-04 DIAGNOSIS — Z6841 Body Mass Index (BMI) 40.0 and over, adult: Secondary | ICD-10-CM | POA: Diagnosis not present

## 2018-12-27 ENCOUNTER — Ambulatory Visit: Payer: Self-pay | Admitting: Dietician

## 2019-01-02 ENCOUNTER — Other Ambulatory Visit: Payer: Self-pay

## 2019-01-02 ENCOUNTER — Encounter: Payer: BLUE CROSS/BLUE SHIELD | Attending: Internal Medicine | Admitting: Skilled Nursing Facility1

## 2019-01-02 DIAGNOSIS — Z6841 Body Mass Index (BMI) 40.0 and over, adult: Secondary | ICD-10-CM | POA: Insufficient documentation

## 2019-01-02 DIAGNOSIS — K219 Gastro-esophageal reflux disease without esophagitis: Secondary | ICD-10-CM | POA: Diagnosis not present

## 2019-01-02 DIAGNOSIS — Z9884 Bariatric surgery status: Secondary | ICD-10-CM | POA: Insufficient documentation

## 2019-01-02 DIAGNOSIS — I10 Essential (primary) hypertension: Secondary | ICD-10-CM | POA: Insufficient documentation

## 2019-01-02 DIAGNOSIS — Z713 Dietary counseling and surveillance: Secondary | ICD-10-CM | POA: Insufficient documentation

## 2019-01-02 DIAGNOSIS — G4733 Obstructive sleep apnea (adult) (pediatric): Secondary | ICD-10-CM | POA: Insufficient documentation

## 2019-01-02 DIAGNOSIS — E669 Obesity, unspecified: Secondary | ICD-10-CM | POA: Insufficient documentation

## 2019-01-02 NOTE — Progress Notes (Signed)
Bariatric Supervised Weight Loss Visit Appt Start Time: 8:20am  End Time: 8:40am  Planned Surgery: Sleeve Gastrectomy   4th out of 6 SWL Appointments   NUTRITION ASSESSMENT  Anthropometrics  Start weight at [facility]: 421.6 lbs (08/30/2018) Today's weight: 419.9 lbs Weight change: +1.1 lbs since previous visit on 10/31/2018 BMI: 56.95 kg/m2    Clinical  Medical Hx: obesity, high blood pressure, sleep apnea, GERD, colon cancer, hernias Surgeries: lap band (2013), colectomy (2007) Medications: amlodipine, chlorthalidone   Psychosocial/Lifestyle Pt works as a Psychologist, occupational. Pt has 3 children living at home with him and his wife, 1 of which has special needs. Pt states he has experienced some feelings of depression, but he is seeing a therapist. Pt has a hx of gastric surgery (lap band in 2013) which was unsuccessful for him. Pt states that, looking back, it was an inappropriate time for him to have surgery and now believes that the gastric sleeve is more appropriate for him.   Pt states his niece who recently had a heart attack and was on life support passed away within the last month, which has been hard for him and his family. Pt states work has also been very busy and especially stressful lately, and involves sitting through long meetings, traveling/riding in the car for long periods of time.  24-Hr Dietary Recall First Meal: Slim Fast shake (or 2 eggs + 2 sausage/bacon + grits sometimes) or banana and peanut butter or cornflakes  Snack: pretzels (or breakfast bar) or skips Second Meal: rotisserie chicken sandwich with mayo on white bread with popcorn or 2 hotdogs  Snack: banana Third Meal 7pm: sauteed spinach (or salad, or broccoli) + rice + chicken or roast beef with grazy mac n cheese and green beans or hotdog and hamburger and coslaw and salad Snack: banana + peanut butter Beverages: water, flavored water, sweet tea, lemonade, 2% milk  Food & Nutrition Related Hx Dietary  Hx:  Pt states he is a stress eater, but has consciously been making better food choices when he is stressed. Previously pt would skip breakfast and/or lunch dt busy work schedule which improved for a while, but now back to skipping lunch several times throughout the week. For example, pt states he does not eat during training, which he has ~3 days per week and lasts ~7 hours/day. Pt states he has an air fryer he and his wife use at home, and they are getting better about not eating too late/too large of a meal in the evening. Pt states he already avoids drinking while eating. Pt states he does not drink alcohol or caffeine. Pt now tracks fluid with Fitbit app. Pt states he has started walking and riding his stationary bike.  Pt states he believes he needs to do better at drinking more water and is working on it. Pt states he has been eating less sweets by keeping them out of the house. Pt states he is an emotional eater, and eating ice cream or frozen yogurt late at night (10pm) after dinner. Pt states now he will eat a banana with peanut butter if he has a snack after dinner.  Pt states he has been conducting a lot of stress eating. Pt states he knows he is stress eater but does not know what do about it. Pt states his worst time is at night. Pt states he has not seen his therapist in 2 months and has not reached out to him. Pt states his family is not supportive (  telling him to just stop eating) and when originally getting his lap band he lost weight to prove them wrong.   Estimated Daily Fluid Intake: ~32 oz water   GI / Other Notable Symptoms: none stated (previously, pt stated acid reflux caused by lap band. Pt states he can recognize certain foods that worsen reflux and he knows some strategies to alleviate this such as sleeping in certain positions, drinking milk, etc.); sluggish if not drinking enough water  Physical Activity  Current average weekly physical activity: walking    Estimated  Energy Needs Calories: 2200 Carbohydrate: 248g Protein: 165g Fat: 61g  Progress Towards Pre-Op Goals Previously Chosen . Chewing food thoroughly is still progressing, but making conscious efforts.  . Eating 3+ times per day.  . Estimated to be reaching or getting close to 80g protein goal. . Dinner meal is eaten a little earlier in the evening, not as large, and if snack/dessert afterwards it is banana with peanut butter instead of ice cream.   . Not drinking sodas/diet sodas.  . Tracking fluid intake on Fitbit app.    NUTRITION DIAGNOSIS  Overweight/obesity ([place]-3.3) related to past poor dietary habits and physical inactivity as evidenced by patient w/ planned Sleeve Gastrectomy surgery following dietary guidelines for continued weight loss.    NUTRITION INTERVENTION  Nutrition counseling (C-1) and education (E-2) to facilitate bariatric surgery goals.  New Pre-Op Goals to Work On  Physical activity    Aim for 10000 steps a day  Aim for a minimum 10000 steps a day  Use your should I eat sheet to help identify emotional eating and deal with your emotions appropriately   Reach back out to your therapist   Handouts Provided Include   Needs  feelings  Use your should I eat sheet to help identify emotional eating   Learning Style & Readiness for Change Teaching method utilized: Visual & Auditory  Demonstrated degree of understanding via: Teach Back  Barriers to learning/adherence to lifestyle change: Contemplative Stage of Change (emotional barriers, such as stress eating and emotional eating)    MONITORING & EVALUATION Dietary intake, weekly physical activity, body weight, and pre-op goals in 1 month.   Next Steps  Notes for next SWL Visit:   Assess progress made on goals previously chosen and choose 1-2 more to begin working on  Further stress the importance of eating more throughout the day to avoid large meal and dessert late at night (which will also address  reflux concerns)  Patient is to return to [facility] in 1 month for next SWL visit.  *Patient stated during his visit on 10/01/2018 that his insurance has changed, which may impact the number of SWL visits he is required to attend. No updates as of 01/02/2019.

## 2019-01-02 NOTE — Patient Instructions (Addendum)
-  Aim for a minimum 10000 steps a day  -Use your should I eat sheet to help identify emotional eating and deal with your emotions appropriately   -Reach back out to your therapist

## 2019-01-14 ENCOUNTER — Ambulatory Visit: Payer: Self-pay | Admitting: Psychiatry

## 2019-02-03 ENCOUNTER — Ambulatory Visit: Payer: Self-pay | Admitting: Skilled Nursing Facility1

## 2019-02-06 ENCOUNTER — Encounter: Payer: Self-pay | Admitting: Skilled Nursing Facility1

## 2019-02-06 ENCOUNTER — Encounter: Payer: BLUE CROSS/BLUE SHIELD | Attending: Internal Medicine | Admitting: Skilled Nursing Facility1

## 2019-02-06 ENCOUNTER — Other Ambulatory Visit: Payer: Self-pay

## 2019-02-06 DIAGNOSIS — E669 Obesity, unspecified: Secondary | ICD-10-CM

## 2019-02-06 DIAGNOSIS — K219 Gastro-esophageal reflux disease without esophagitis: Secondary | ICD-10-CM | POA: Insufficient documentation

## 2019-02-06 DIAGNOSIS — Z713 Dietary counseling and surveillance: Secondary | ICD-10-CM | POA: Insufficient documentation

## 2019-02-06 DIAGNOSIS — Z6841 Body Mass Index (BMI) 40.0 and over, adult: Secondary | ICD-10-CM | POA: Diagnosis not present

## 2019-02-06 DIAGNOSIS — Z9884 Bariatric surgery status: Secondary | ICD-10-CM | POA: Insufficient documentation

## 2019-02-06 DIAGNOSIS — I10 Essential (primary) hypertension: Secondary | ICD-10-CM | POA: Insufficient documentation

## 2019-02-06 DIAGNOSIS — G4733 Obstructive sleep apnea (adult) (pediatric): Secondary | ICD-10-CM | POA: Insufficient documentation

## 2019-02-06 NOTE — Progress Notes (Signed)
Bariatric Supervised Weight Loss Visit Appt Start Time: 8:20am  End Time: 8:40am  Planned Surgery: Sleeve Gastrectomy   5th out of 6 SWL Appointments   NUTRITION ASSESSMENT  Anthropometrics  Start weight at [facility]: 421.6 lbs (08/30/2018) Today's weight: 419.9 lbs Weight change: +1.1 lbs since previous visit on 10/31/2018 BMI: 56.95 kg/m2    Clinical  Medical Hx: obesity, high blood pressure, sleep apnea, GERD, colon cancer, hernias Surgeries: lap band (2013), colectomy (2007) Medications: amlodipine, chlorthalidone   Psychosocial/Lifestyle Pt works as a Psychologist, occupational. Pt has 3 children living at home with him and his wife, 1 of which has special needs. Pt states he has experienced some feelings of depression, but he is seeing a therapist. Pt has a hx of gastric surgery (lap band in 2013) which was unsuccessful for him. Pt states that, looking back, it was an inappropriate time for him to have surgery and now believes that the gastric sleeve is more appropriate for him. Pt lost his niece in the last few months.    24-Hr Dietary Recall First Meal: Slim Fast shake (or 2 eggs + 2 sausage/bacon + grits sometimes) or banana and peanut butter or cornflakes  Snack: pretzels (or breakfast bar) or skips Second Meal: rotisserie chicken sandwich with mayo on white bread with popcorn or 2 hotdogs or tacos with brown rice Snack: banana or other fruit Third Meal 7pm: sauteed spinach (or salad, or broccoli) + rice + chicken or roast beef with grazy mac n cheese and green beans or hotdog and hamburger and coslaw and salad Snack: banana + peanut butter Beverages: water, flavored water, sweet tea, lemonade, 2% milk  Food & Nutrition Related Hx Dietary Hx:  Pt states he is a stress eater, but has consciously been making better food choices when he is stressed.  Pt states he knows he is stress eater but does not know what do about it. Pt states his worst time is at night. Pt states in the  last 2 weeks he has been trying to eat NO bread and sugar with a cheat day on Sunday. Pt states he has been feeling a little weak stating that is due to his blood pressure medication. Pt states food is Filling an emotion in his life and a comfort. Pt states he tries to remind himself diets have an end which is not want he wants to do. Pt states he does not want to think about his food wants to not think about food Pt states he is working towards not buying the foods he emotionally eats with. Pt states his family Not supportive at home.    Estimated Daily Fluid Intake: ~32 oz water   GI / Other Notable Symptoms: none stated (previously, pt stated acid reflux caused by lap band. Pt states he can recognize certain foods that worsen reflux and he knows some strategies to alleviate this such as sleeping in certain positions, drinking milk, etc.); sluggish if not drinking enough water  Physical Activity  Current average weekly physical activity: walking    Estimated Energy Needs Calories: 2200 Carbohydrate: 248g Protein: 165g Fat: 61g  Progress Towards Pre-Op Goals Previously Chosen . Chewing food thoroughly is still progressing, but making conscious efforts.  . Eating 3+ times per day.  . Estimated to be reaching or getting close to 80g protein goal. . Dinner meal is eaten a little earlier in the evening, not as large, and if snack/dessert afterwards it is banana with peanut butter instead of ice cream.   .  Not drinking sodas/diet sodas.  . Tracking fluid intake on Fitbit app.    NUTRITION DIAGNOSIS  Overweight/obesity ([place]-3.3) related to past poor dietary habits and physical inactivity as evidenced by patient w/ planned Sleeve Gastrectomy surgery following dietary guidelines for continued weight loss.    NUTRITION INTERVENTION  Nutrition counseling (C-1) and education (E-2) to facilitate bariatric surgery goals.  New Pre-Op Goals to Work On  Physical activity    Aim for 10000 steps a  day  Aim for a minimum 10000 steps a day  Use your should I eat sheet to help identify emotional eating and deal with your emotions appropriately   Reach back out to your therapist   Increase activity: vacuum,  walking to the mailbox, 30 minutes 3 days a week walking  Aim to have non starchy vegetables 2 times a day 7 days a week (larger portion than the other foods on your plate)    Learning Style & Readiness for Change Teaching method utilized: Visual & Auditory  Demonstrated degree of understanding via: Teach Back  Barriers to learning/adherence to lifestyle change: Contemplative Stage of Change (emotional barriers, such as stress eating and emotional eating)    MONITORING & EVALUATION Dietary intake, weekly physical activity, body weight, and pre-op goals in 1 month.   Next Steps  Notes for next SWL Visit:   Assess progress made on goals previously chosen and choose 1-2 more to begin working on  Further stressed the importance of consistency with eating balanced meals throughout the week and recognizing emotional eating   Patient is to return to [facility] in 1 month for next SWL visit.  *Patient stated during his visit on 10/01/2018 that his insurance has changed, which may impact the number of SWL visits he is required to attend. No updates as of 02/06/2019.

## 2019-02-10 ENCOUNTER — Other Ambulatory Visit: Payer: Self-pay

## 2019-02-10 MED ORDER — AMLODIPINE BESYLATE 10 MG PO TABS: 10.0000 mg | ORAL_TABLET | Freq: Every day | ORAL | 3 refills | Status: DC

## 2019-02-11 ENCOUNTER — Ambulatory Visit: Payer: BLUE CROSS/BLUE SHIELD | Admitting: Psychiatry

## 2019-02-20 ENCOUNTER — Ambulatory Visit (INDEPENDENT_AMBULATORY_CARE_PROVIDER_SITE_OTHER): Payer: BLUE CROSS/BLUE SHIELD | Admitting: Professional

## 2019-02-20 DIAGNOSIS — F66 Other sexual disorders: Secondary | ICD-10-CM | POA: Diagnosis not present

## 2019-03-12 ENCOUNTER — Other Ambulatory Visit: Payer: Self-pay

## 2019-03-12 ENCOUNTER — Encounter: Payer: Self-pay | Attending: Internal Medicine | Admitting: Skilled Nursing Facility1

## 2019-03-12 DIAGNOSIS — K219 Gastro-esophageal reflux disease without esophagitis: Secondary | ICD-10-CM | POA: Insufficient documentation

## 2019-03-12 DIAGNOSIS — Z9884 Bariatric surgery status: Secondary | ICD-10-CM | POA: Insufficient documentation

## 2019-03-12 DIAGNOSIS — I10 Essential (primary) hypertension: Secondary | ICD-10-CM | POA: Insufficient documentation

## 2019-03-12 DIAGNOSIS — Z713 Dietary counseling and surveillance: Secondary | ICD-10-CM | POA: Insufficient documentation

## 2019-03-12 DIAGNOSIS — G4733 Obstructive sleep apnea (adult) (pediatric): Secondary | ICD-10-CM | POA: Insufficient documentation

## 2019-03-12 DIAGNOSIS — E669 Obesity, unspecified: Secondary | ICD-10-CM | POA: Insufficient documentation

## 2019-03-12 DIAGNOSIS — Z6841 Body Mass Index (BMI) 40.0 and over, adult: Secondary | ICD-10-CM | POA: Insufficient documentation

## 2019-03-12 NOTE — Progress Notes (Signed)
Bariatric Supervised Weight Loss Visit Appt Start Time: 8:20am  End Time: 8:40am  Planned Surgery: Sleeve Gastrectomy   6th out of 6 SWL Appointments   NUTRITION ASSESSMENT  Anthropometrics  Start weight at [facility]: 421.6 lbs (08/30/2018) Today's weight: 408.6 lbs Weight change: -7 lbs since previous visit on 10/31/2018 BMI: 55.42 kg/m2    Clinical  Medical Hx: obesity, high blood pressure, sleep apnea, GERD, colon cancer, hernias Surgeries: lap band (2013), colectomy (2007) Medications: amlodipine, chlorthalidone   Psychosocial/Lifestyle Pt works as a Psychologist, occupational. Pt has 3 children living at home with him and his wife, 1 of which has special needs. Pt states he has experienced some feelings of depression, but he is seeing a therapist. Pt has a hx of gastric surgery (lap band in 2013) which was unsuccessful for him. Pt states that, looking back, it was an inappropriate time for him to have surgery and now believes that the gastric sleeve is more appropriate for him. Pt lost his niece in the last few months.    24-Hr Dietary Recall First Meal: Slim Fast shake (or 2 eggs + 2 sausage/bacon + grits sometimes) or banana and peanut butter or cornflakes  Snack: pretzels (or breakfast bar) or skips Second Meal: rotisserie chicken sandwich with mayo on white bread with popcorn or 2 hotdogs or tacos with brown rice Snack: banana or other fruit Third Meal 7pm: sauteed spinach (or salad, or broccoli) + rice + chicken or roast beef with grazy mac n cheese and green beans or hotdog and hamburger and coslaw and salad Snack: banana + peanut butter Beverages: water, flavored water, sweet tea, lemonade, 2% milk  Food & Nutrition Related Hx Dietary Hx:   Pt states he has made a lot of changes he is proud of. Pt states he feels he may struggle with the slow fad after surgery but states he is going to work on awareness in the future by [employer] support group more as well as using more  work aquantance support  Pt arrives having lost about 7 pounds. Pt states he had his first psychology appt which helped him realize he was having mindless eating and paying attention to how fast he has been eating. Pt states this past week in particular he was struggling with craving. Pt states he bought sugar free cookies which he overate on causing a lot of gas. With everything going on you have to control what goes ointo your head. Working on a new structure and different things to deal with situations he cannot control. wokring on being present with the food. Pt sttes s he wnate dto be walkig more so he will work on that with his blood pressure medcaiton making his feel tired.   Estimated Daily Fluid Intake: ~32 oz water   GI / Other Notable Symptoms: none stated (previously, pt stated acid reflux caused by lap band. Pt states he can recognize certain foods that worsen reflux and he knows some strategies to alleviate this such as sleeping in certain positions, drinking milk, etc.); sluggish if not drinking enough water  Physical Activity  Current average weekly physical activity: walking    Estimated Energy Needs Calories: 2200 Carbohydrate: 248g Protein: 165g Fat: 61g  Progress Towards Pre-Op Goals Previously Chosen . Chewing food thoroughly is still progressing, but making conscious efforts.  . Eating 3+ times per day.  . Estimated to be reaching or getting close to 80g protein goal. . Dinner meal is eaten a little earlier in the evening,  not as large, and if snack/dessert afterwards it is banana with peanut butter instead of ice cream.   . Not drinking sodas/diet sodas.  . Tracking fluid intake on Fitbit app.   . working on emotional eating   NUTRITION DIAGNOSIS  Overweight/obesity ([place]-3.3) related to past poor dietary habits and physical inactivity as evidenced by patient w/ planned Sleeve Gastrectomy surgery following dietary guidelines for continued weight loss.    NUTRITION  INTERVENTION  Nutrition counseling (C-1) and education (E-2) to facilitate bariatric surgery goals.    Learning Style & Readiness for Change Teaching method utilized: Visual & Auditory  Demonstrated degree of understanding via: Teach Back  Barriers to learning/adherence to lifestyle change: Contemplative Stage of Change (emotional barriers, such as stress eating and emotional eating)    MONITORING & EVALUATION Dietary intake, weekly physical activity, body weight, and pre-op goals in 1 month.    *Patient stated during his visit on 10/01/2018 that his insurance has changed, which may impact the number of SWL visits he is required to attend. No updates as of 02/06/2019.

## 2019-03-20 ENCOUNTER — Ambulatory Visit (INDEPENDENT_AMBULATORY_CARE_PROVIDER_SITE_OTHER): Payer: BC Managed Care – PPO | Admitting: Professional

## 2019-03-20 DIAGNOSIS — F509 Eating disorder, unspecified: Secondary | ICD-10-CM | POA: Diagnosis not present

## 2019-03-24 ENCOUNTER — Ambulatory Visit (INDEPENDENT_AMBULATORY_CARE_PROVIDER_SITE_OTHER): Payer: BC Managed Care – PPO | Admitting: Professional

## 2019-03-24 DIAGNOSIS — F509 Eating disorder, unspecified: Secondary | ICD-10-CM | POA: Diagnosis not present

## 2019-03-25 ENCOUNTER — Other Ambulatory Visit: Payer: Self-pay

## 2019-04-03 DIAGNOSIS — G4730 Sleep apnea, unspecified: Secondary | ICD-10-CM | POA: Diagnosis not present

## 2019-04-05 ENCOUNTER — Telehealth: Payer: Self-pay

## 2019-04-05 ENCOUNTER — Other Ambulatory Visit: Payer: Self-pay

## 2019-04-05 ENCOUNTER — Ambulatory Visit (HOSPITAL_COMMUNITY)
Admission: EM | Admit: 2019-04-05 | Discharge: 2019-04-05 | Disposition: A | Discharge status: Home / Self Care | Payer: BC Managed Care – PPO | Attending: Family Medicine | Admitting: Family Medicine

## 2019-04-05 DIAGNOSIS — I10 Essential (primary) hypertension: Secondary | ICD-10-CM

## 2019-04-05 DIAGNOSIS — Z20822 Contact with and (suspected) exposure to covid-19: Secondary | ICD-10-CM

## 2019-04-05 DIAGNOSIS — M791 Myalgia, unspecified site: Secondary | ICD-10-CM

## 2019-04-05 DIAGNOSIS — Z20828 Contact with and (suspected) exposure to other viral communicable diseases: Secondary | ICD-10-CM

## 2019-04-05 DIAGNOSIS — R5383 Other fatigue: Secondary | ICD-10-CM

## 2019-04-05 DIAGNOSIS — R51 Headache: Secondary | ICD-10-CM

## 2019-04-05 DIAGNOSIS — R509 Fever, unspecified: Secondary | ICD-10-CM

## 2019-04-05 MED ORDER — TRAMADOL HCL 50 MG PO TABS: 50.0000 mg | ORAL_TABLET | Freq: Four times a day (QID) | ORAL | 0 refills | Status: DC | PRN

## 2019-04-05 MED ORDER — ALBUTEROL SULFATE HFA 108 (90 BASE) MCG/ACT IN AERS: 2.0000 | INHALATION_SPRAY | RESPIRATORY_TRACT | 1 refills | Status: DC | PRN

## 2019-04-05 NOTE — ED Triage Notes (Addendum)
Per pt he has been feeling fatigue, sore throat and body aches since Wednesday. Had a cough but has subsided. pT SAYS HE FEELS NAUSEATED. slight LOW GRADE FEVER. Neck and head and down his back is aching

## 2019-04-05 NOTE — Telephone Encounter (Signed)
Pt scheduled 04/07/19 at 2:30 at [facility]. Pt given address and advised to wear mask and to stay in car at testing site. Pt verbalized understanding.

## 2019-04-05 NOTE — Telephone Encounter (Signed)
-----   Message from Scot Jun, [place] sent at 04/05/2019  3:02 PM EDT ----- Regarding: COVID 19 Testing Please contact patient to schedule COVID 19 testing. Symptomatic.

## 2019-04-05 NOTE — ED Provider Notes (Signed)
[place]    CSN: [id number] Arrival date & time: 04/05/19  1201      History   Chief Complaint Chief Complaint  Patient presents with  . Fatigue  . Sore Throat  . Nausea    HPI Cameron Jones is a 53 y.o. male.   HPI  Patient presents today with a complaint of four days of fatigue, body aches, headache , new today, increased work of breathing. No history of asthma. He is morbidly obese. Endorses recent travel to [place] for a family trip last week. No other sick contacts in home. Endorses fever at home 100.5-99.0 F. He had a really bad cough several days ago which has gone away. Body aches and neck pain have worsened. He has a sore throat today which is new. No loss of taste or smell. He called his primary care provider and was referred here. He is experiencing increased work of breathing with activity, although denies distress. Denies chest tightness. Past Medical History:  Diagnosis Date  . Arthritis    Knee  . Cancer ([place])    colon  . Carpal tunnel syndrome of right wrist   . Fatty liver    per Dr Radford Pax note  . GERD (gastroesophageal reflux disease)   . Hernia   . History of blood transfusion   . Hypertension   . Lower back pain   . Obesity   . Sleep apnea    severe per report 12/11  on chart  . Ventral hernia     Patient Active Problem List   Diagnosis Date Noted  . Essential hypertension 11/23/2018  . OSA (obstructive sleep apnea) 07/26/2017  . Partial small bowel obstruction ([place]) 12/27/2016  . 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 Colon cancer-right T3 N0, Oct 2007 10/06/2011  . Ventral hernia-midline 10/06/2011  . Obstructive sleep apnea of adult 10/06/2011  . Obesity BMI 61 10/06/2011    Past Surgical History:  Procedure Laterality Date  . BREATH TEK H PYLORI  11/07/2011   Procedure: BREATH TEK H PYLORI;  Surgeon: Shann Medal, MD;  Location: [facility];  Service: General;  Laterality: N/A;  . BREATH TEK H PYLORI  01/02/2012   Procedure: BREATH TEK H PYLORI;  Surgeon: Pedro Earls, MD;  Location: [facility];  Service: General;  Laterality: N/A;  . COLON SURGERY  06/2006   colectomy  . COLONOSCOPY N/A 05/13/2013   Procedure: COLONOSCOPY;  Surgeon: Winfield Cunas., MD;  Location: [facility];  Service: Endoscopy;  Laterality: N/A;  . COLONOSCOPY N/A 08/30/2017   Procedure: COLONOSCOPY;  Surgeon: Laurence Spates, MD;  Location: [facility];  Service: Endoscopy;  Laterality: N/A;  . LAPAROSCOPIC GASTRIC BANDING  05/07/2012   Procedure: LAPAROSCOPIC GASTRIC BANDING;  Surgeon: Pedro Earls, MD;  Location: [facility];  Service: General;  Laterality: N/A;       Home Medications    Prior to Admission medications   Medication Sig Start Date End Date Taking? Authorizing Provider  amLODipine (NORVASC) 10 MG tablet Take 1 tablet (10 mg total) by mouth daily. 02/10/19   Patwardhan, Manish J, MD  chlorthalidone (HYGROTON) 25 MG tablet TAKE 1 TABLET BY MOUTH EVERY DAY 11/21/18   Patwardhan, Manish J, MD  KLOR-CON M20 20 MEQ tablet Take 2 tablets by mouth daily. 10/12/18   [provider]  losartan (COZAAR) 25 MG tablet Take 25 mg by mouth daily. 09/27/18   [provider]  LOSARTAN POTASSIUM PO Take 25 mg by  mouth daily.    [provider]  Multiple Vitamins-Minerals (MULTIVITAMIN WITH MINERALS) tablet Take 4 tablets by mouth daily. gummies    [provider]  ondansetron (ZOFRAN) 4 MG tablet Take 1 tablet (4 mg total) by mouth every 8 (eight) hours as needed for nausea or vomiting. Patient not taking: Reported on 11/22/2018 05/02/18   Jillyn Ledger, PA-C  oxyCODONE-acetaminophen (PERCOCET/ROXICET) 5-325 MG tablet Take 2 tablets by mouth every 4 (four) hours as needed for severe pain. Patient not taking: Reported on 11/22/2018 05/02/18   Jillyn Ledger, PA-C    Family History Family History  Problem Relation Age of Onset  . Cancer Mother        pancreatic  . Cancer Brother         colon  . Alzheimer's disease Father   . Hypertension Sister   . Cancer Maternal Aunt        breast    Social History Social History   Tobacco Use  . Smoking status: Never Smoker  . Smokeless tobacco: Never Used  Substance Use Topics  . Alcohol use: Yes    Alcohol/week: 1.0 standard drinks    Types: 1 Standard drinks or equivalent per week    Comment: wine  1 glass evry 2-3 months  . Drug use: No     Allergies   Patient has no known allergies.   Review of Systems Review of Systems Pertinent negatives listed in HPI  Physical Exam Triage Vital Signs ED Triage Vitals  Enc Vitals Group     BP 04/05/19 1334 (!) 152/90     Pulse Rate 04/05/19 1334 94     Resp 04/05/19 1334 16     Temp 04/05/19 1334 98.9 F (37.2 C)     Temp Source 04/05/19 1334 Oral     SpO2 04/05/19 1334 100 %     Weight --      Height --      Head Circumference --      Peak Flow --      Pain Score 04/05/19 1333 6     Pain Loc --      Pain Edu? --      Excl. in [place]? --    No data found.  Updated Vital Signs BP (!) 152/90 (BP Location: Right Arm)   Pulse 94   Temp 98.9 F (37.2 C) (Oral)   Resp 16   SpO2 100%   Visual Acuity Right Eye Distance:   Left Eye Distance:   Bilateral Distance:    Right Eye Near:   Left Eye Near:    Bilateral Near:     Physical Exam General appearance: alert, well developed, well nourished, cooperative and in no distress Head: Normocephalic, without obvious abnormality, atraumatic Oropharynx: negative of erythema or exudate, dry oral mucosa Respiratory: Respirations even and unlabored, normal respiratory rate Heart: rate and rhythm normal. No gallop or murmurs noted on exam  Abdomen: BS +, no distention, no rebound tenderness, or no mass Extremities: No gross deformities Skin: Skin color, texture, turgor normal. No rashes seen  Psych: Appropriate mood and affect. Neurologic: Mental status: Alert, oriented to person, place, and time, thought content  appropriate.  UC Treatments / Results  Labs (all labs ordered are listed, but only abnormal results are displayed) Labs Reviewed - No data to display  EKG  Radiology No results found.  Procedures Procedures (including critical care time)  Medications Ordered in UC Medications - No data to display  Initial Impression / Assessment and Plan / UC Course  I have reviewed the triage vital signs and the nursing notes.  Pertinent labs & imaging results that were available during my care of the patient were reviewed by me and considered in my medical decision making (see chart for details).    COVID-19 testing ordered as patient is symptomatic of COVID-19 symptoms. He recently traveled to [place] which is high risk COVID area. Patient advised to self-quarantine and home isolation precautions discussed. Red flags that warrant immediate follow-up at the ED discussed. [facility] team will contact you to schedule an appointment for COVID testing. Albuterol prescribed to improve work of breathing and a short course of tramadol provided for pain.  Final Clinical Impressions(s) / UC Diagnoses   Final diagnoses:  Suspected Covid-19 Virus Infection     Discharge Instructions     I recommend self quarantine at home until COVID-19 results are available. Take care to frequently wash hands. Always wear a mask.  You will be contact by a member from the [facility] to schedule your COVID 19 testing. If questions, please contact us here at urgent care.    ED Prescriptions    Medication Sig Dispense Auth. Provider   albuterol (VENTOLIN HFA) 108 (90 Base) MCG/ACT inhaler Inhale 2 puffs into the lungs every 4 (four) hours as needed for wheezing or shortness of breath (cough, shortness of breath or wheezing.). 8 g Scot Jun, FNP   traMADol (ULTRAM) 50 MG tablet  (Status: Discontinued) Take 1 tablet (50 mg total) by mouth every 6 (six) hours as needed. 15 tablet Scot Jun,  FNP   traMADol (ULTRAM) 50 MG tablet Take 1 tablet (50 mg total) by mouth every 6 (six) hours as needed. 15 tablet Scot Jun, FNP     Controlled Substance Prescriptions [place] Controlled Substance Registry consulted? Not Applicable   Scot Jun, [place] 04/06/19 [phone]

## 2019-04-05 NOTE — Discharge Instructions (Addendum)
I recommend self quarantine at home until COVID-19 results are available. Take care to frequently wash hands. Always wear a mask.  You will be contact by a member from the [facility] [place] [facility] to schedule your COVID 19 testing. If questions, please contact us here at urgent care.

## 2019-04-07 ENCOUNTER — Other Ambulatory Visit: Payer: Self-pay

## 2019-04-07 DIAGNOSIS — R51 Headache: Secondary | ICD-10-CM | POA: Diagnosis not present

## 2019-04-07 DIAGNOSIS — L0889 Other specified local infections of the skin and subcutaneous tissue: Secondary | ICD-10-CM | POA: Diagnosis not present

## 2019-04-07 DIAGNOSIS — R509 Fever, unspecified: Secondary | ICD-10-CM | POA: Diagnosis not present

## 2019-04-07 DIAGNOSIS — Z20822 Contact with and (suspected) exposure to covid-19: Secondary | ICD-10-CM

## 2019-04-07 DIAGNOSIS — G4733 Obstructive sleep apnea (adult) (pediatric): Secondary | ICD-10-CM | POA: Diagnosis not present

## 2019-04-07 DIAGNOSIS — Z20818 Contact with and (suspected) exposure to other bacterial communicable diseases: Secondary | ICD-10-CM | POA: Diagnosis not present

## 2019-04-11 ENCOUNTER — Other Ambulatory Visit: Payer: Self-pay

## 2019-04-11 DIAGNOSIS — R6889 Other general symptoms and signs: Secondary | ICD-10-CM | POA: Diagnosis not present

## 2019-04-11 DIAGNOSIS — Z20822 Contact with and (suspected) exposure to covid-19: Secondary | ICD-10-CM

## 2019-04-13 LAB — NOVEL CORONAVIRUS, NAA: SARS-CoV-2, NAA: NOT DETECTED

## 2019-04-18 ENCOUNTER — Telehealth: Payer: Self-pay | Admitting: General Practice

## 2019-04-18 NOTE — Telephone Encounter (Signed)
Pt given Covid - 19 test results. Spoke with pt directly

## 2019-04-23 ENCOUNTER — Other Ambulatory Visit: Payer: Self-pay

## 2019-04-23 MED ORDER — LOSARTAN POTASSIUM 25 MG PO TABS: 25.0000 mg | ORAL_TABLET | Freq: Every day | ORAL | 0 refills | Status: DC

## 2019-05-20 DIAGNOSIS — Z0000 Encounter for general adult medical examination without abnormal findings: Secondary | ICD-10-CM | POA: Diagnosis not present

## 2019-05-20 DIAGNOSIS — I10 Essential (primary) hypertension: Secondary | ICD-10-CM | POA: Diagnosis not present

## 2019-05-20 DIAGNOSIS — Z125 Encounter for screening for malignant neoplasm of prostate: Secondary | ICD-10-CM | POA: Diagnosis not present

## 2019-05-20 DIAGNOSIS — E7800 Pure hypercholesterolemia, unspecified: Secondary | ICD-10-CM | POA: Diagnosis not present

## 2019-05-20 DIAGNOSIS — R82998 Other abnormal findings in urine: Secondary | ICD-10-CM | POA: Diagnosis not present

## 2019-05-26 DIAGNOSIS — Z0000 Encounter for general adult medical examination without abnormal findings: Secondary | ICD-10-CM | POA: Diagnosis not present

## 2019-05-26 DIAGNOSIS — Z1331 Encounter for screening for depression: Secondary | ICD-10-CM | POA: Diagnosis not present

## 2019-05-26 DIAGNOSIS — G4733 Obstructive sleep apnea (adult) (pediatric): Secondary | ICD-10-CM | POA: Diagnosis not present

## 2019-05-26 DIAGNOSIS — Z9884 Bariatric surgery status: Secondary | ICD-10-CM | POA: Diagnosis not present

## 2019-05-26 DIAGNOSIS — Z85038 Personal history of other malignant neoplasm of large intestine: Secondary | ICD-10-CM | POA: Diagnosis not present

## 2019-06-09 ENCOUNTER — Ambulatory Visit (INDEPENDENT_AMBULATORY_CARE_PROVIDER_SITE_OTHER): Payer: BC Managed Care – PPO | Admitting: Professional

## 2019-07-02 ENCOUNTER — Ambulatory Visit (INDEPENDENT_AMBULATORY_CARE_PROVIDER_SITE_OTHER): Payer: BC Managed Care – PPO | Admitting: Professional

## 2019-07-02 DIAGNOSIS — F411 Generalized anxiety disorder: Secondary | ICD-10-CM

## 2019-07-15 ENCOUNTER — Ambulatory Visit (INDEPENDENT_AMBULATORY_CARE_PROVIDER_SITE_OTHER): Payer: BC Managed Care – PPO | Admitting: Professional

## 2019-07-21 ENCOUNTER — Encounter: Payer: BC Managed Care – PPO | Attending: Surgery | Admitting: Skilled Nursing Facility1

## 2019-07-21 ENCOUNTER — Other Ambulatory Visit: Payer: Self-pay

## 2019-07-21 DIAGNOSIS — E669 Obesity, unspecified: Secondary | ICD-10-CM | POA: Diagnosis not present

## 2019-07-23 NOTE — Progress Notes (Signed)
Pre-Operative Nutrition Class:  Appt start time: 8466   End time:  1830.  Patient was seen on 07/23/2019 for Pre-Operative Bariatric Surgery Education at [facility].   Surgery date:  Surgery type: sleeve Start weight at [facility]: 421.6 Weight today: 421.6   The following the learning objectives were met by the patient during this course:  Identify Pre-Op Dietary Goals and will begin 2 weeks pre-operatively  Identify appropriate sources of fluids and proteins   State protein recommendations and appropriate sources pre and post-operatively  Identify Post-Operative Dietary Goals and will follow for 2 weeks post-operatively  Identify appropriate multivitamin and calcium sources  Describe the need for physical activity post-operatively and will follow MD recommendations  State when to call healthcare provider regarding medication questions or post-operative complications  Handouts given during class include:  Pre-Op Bariatric Surgery Diet Handout  Protein Shake Handout  Post-Op Bariatric Surgery Nutrition Handout  BELT Program Information Flyer  Support Group Information Flyer  [facility] Bariatric Supplements Price List  Follow-Up Plan: Patient will follow-up at [facility] 2 weeks post operatively for diet advancement per MD.

## 2019-07-30 ENCOUNTER — Ambulatory Visit (INDEPENDENT_AMBULATORY_CARE_PROVIDER_SITE_OTHER): Payer: BC Managed Care – PPO | Admitting: Professional

## 2019-07-30 DIAGNOSIS — F411 Generalized anxiety disorder: Secondary | ICD-10-CM | POA: Diagnosis not present

## 2019-08-06 DIAGNOSIS — Z85038 Personal history of other malignant neoplasm of large intestine: Secondary | ICD-10-CM | POA: Diagnosis not present

## 2019-08-06 DIAGNOSIS — G4733 Obstructive sleep apnea (adult) (pediatric): Secondary | ICD-10-CM | POA: Diagnosis not present

## 2019-08-06 DIAGNOSIS — K760 Fatty (change of) liver, not elsewhere classified: Secondary | ICD-10-CM | POA: Diagnosis not present

## 2019-08-06 DIAGNOSIS — K219 Gastro-esophageal reflux disease without esophagitis: Secondary | ICD-10-CM | POA: Diagnosis not present

## 2019-08-10 ENCOUNTER — Other Ambulatory Visit: Payer: Self-pay | Admitting: Cardiology

## 2019-08-10 DIAGNOSIS — Z20822 Contact with and (suspected) exposure to covid-19: Secondary | ICD-10-CM

## 2019-08-11 ENCOUNTER — Ambulatory Visit (INDEPENDENT_AMBULATORY_CARE_PROVIDER_SITE_OTHER): Payer: BC Managed Care – PPO | Admitting: Professional

## 2019-08-11 DIAGNOSIS — F411 Generalized anxiety disorder: Secondary | ICD-10-CM | POA: Diagnosis not present

## 2019-08-11 LAB — NOVEL CORONAVIRUS, NAA: SARS-CoV-2, NAA: NOT DETECTED

## 2019-08-12 IMAGING — DX DG CHEST 2V
2 series · 2 of 2 positions shown · non-contrast
Comparison: Upper GI series today reported separately. Chest
radiographs 12/27/2016 and earlier.

CLINICAL DATA: 52-year-old male preoperative study for gastric band
reconstruction.

EXAM:
CHEST - 2 VIEW

[chest pa]
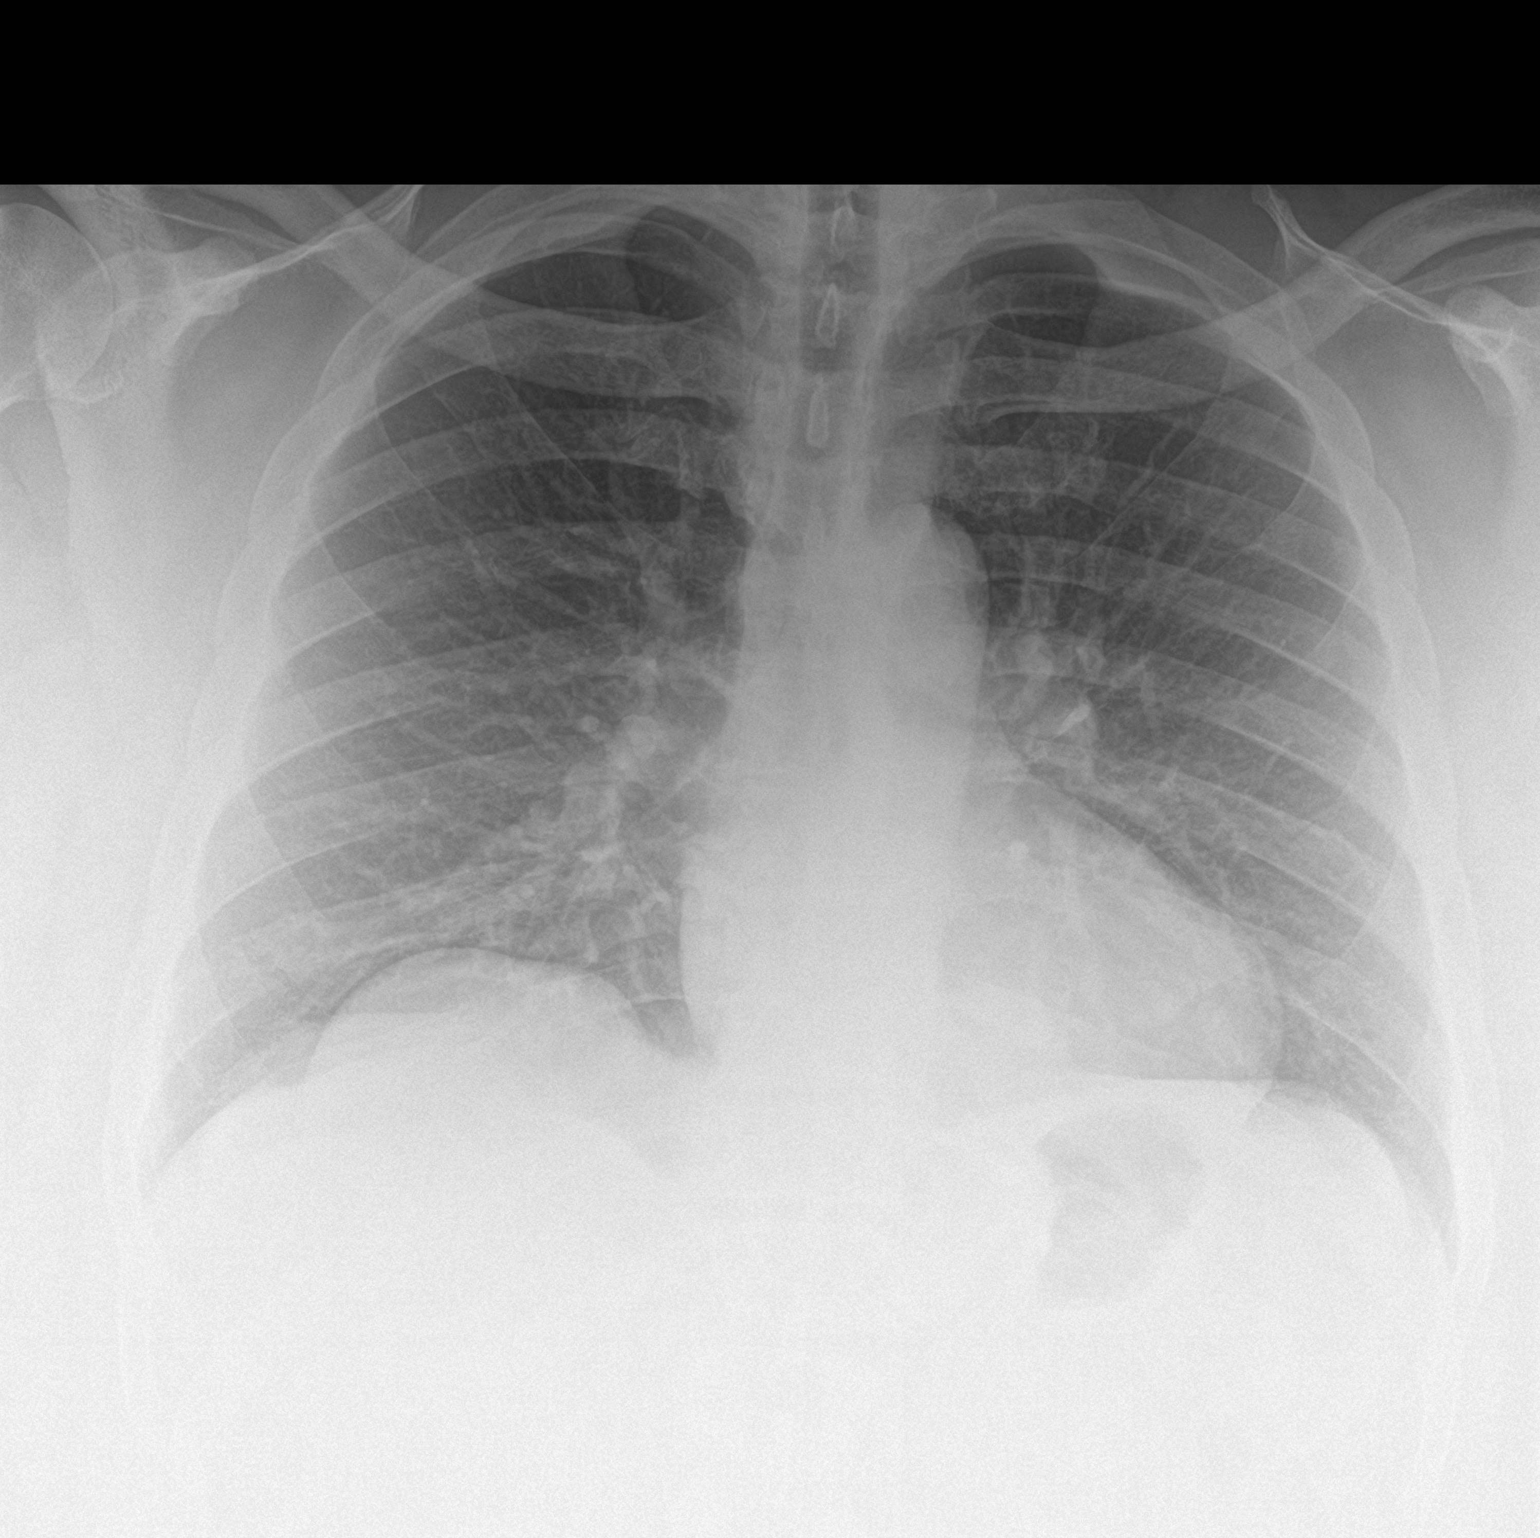

[chest lat]
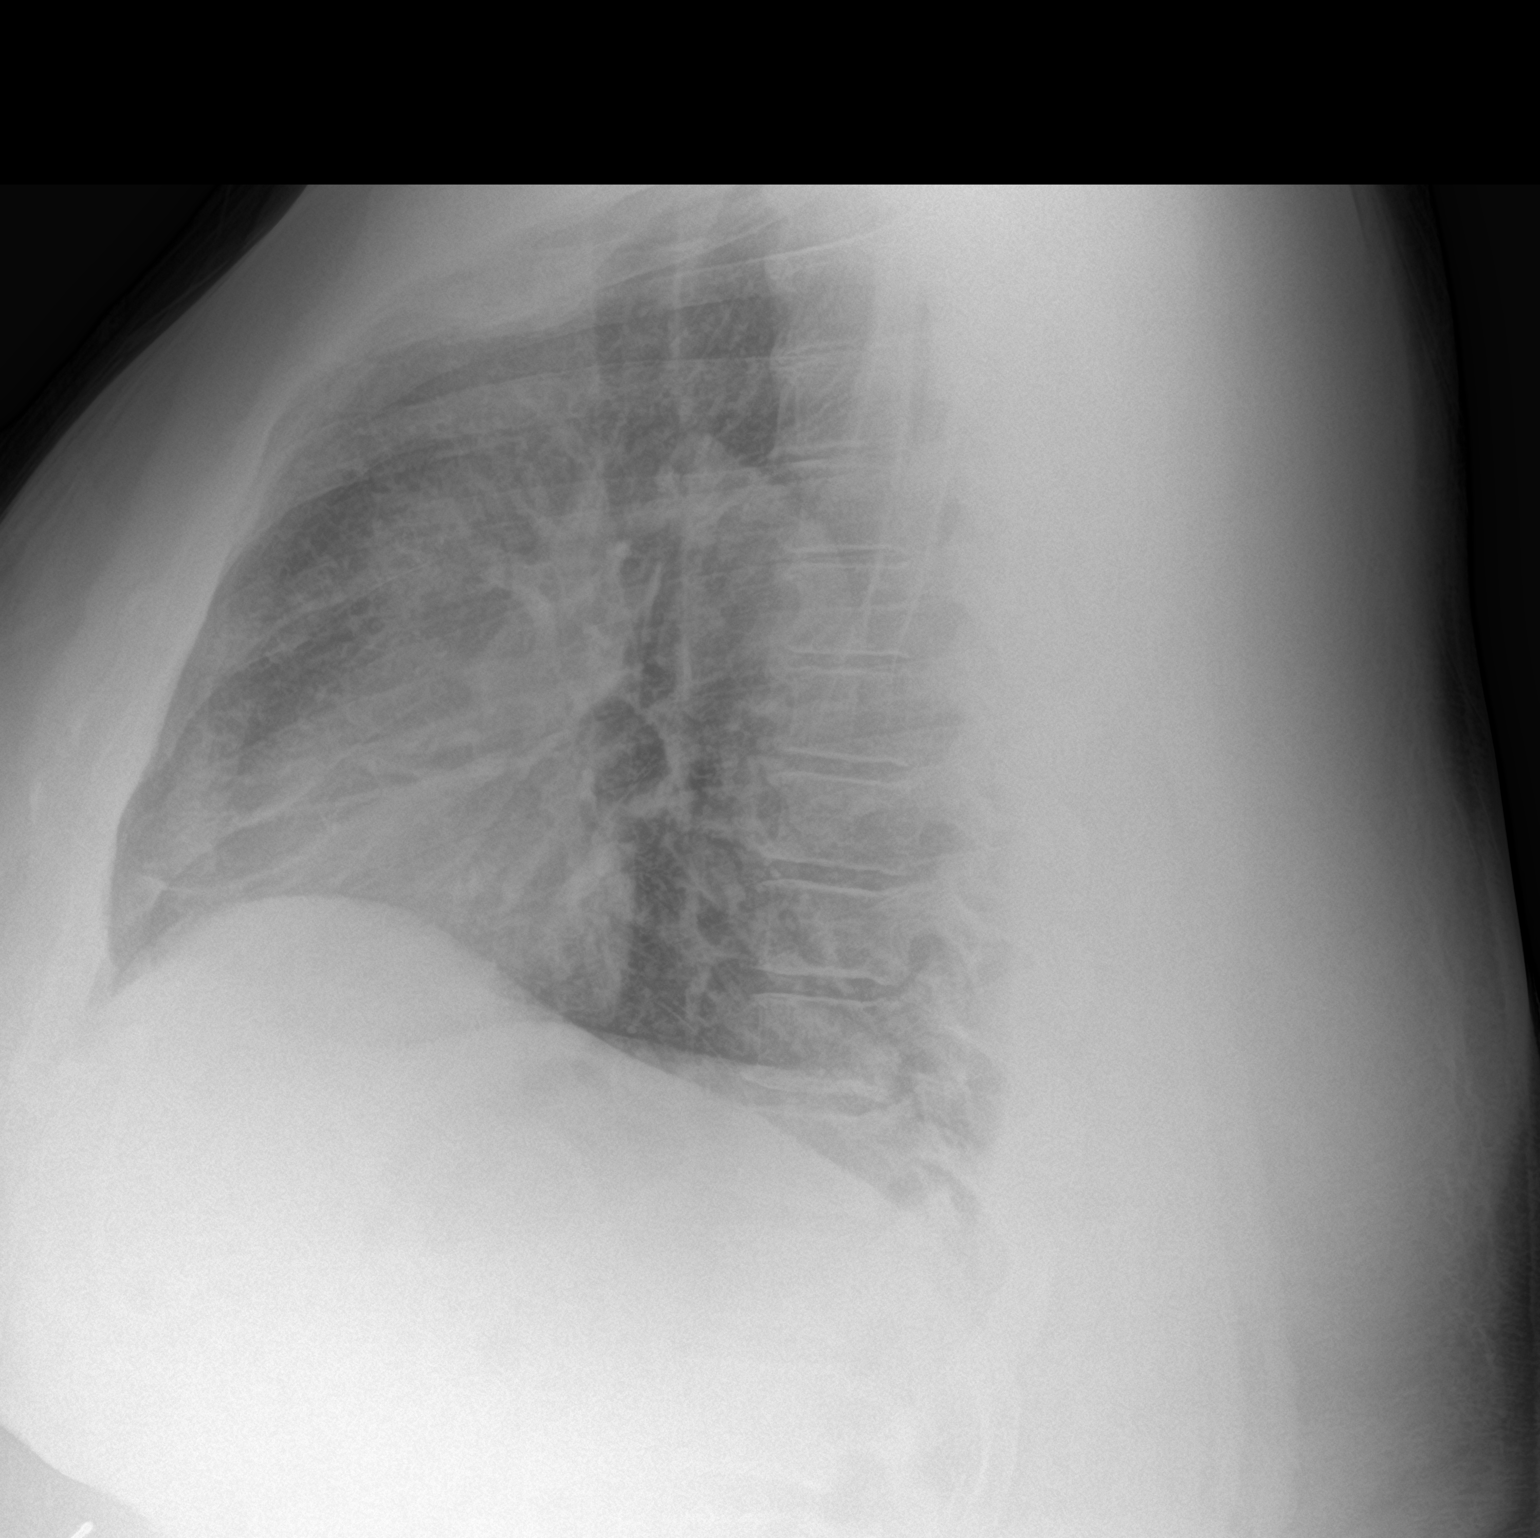

[2 of 2 positions shown; findings below may reference images not displayed]

FINDINGS: Lung volumes and mediastinal contours are stable and within normal
limits., chronic eventration of the right hemidiaphragm (normal
variant). Visualized tracheal air column is within normal limits. No
pneumothorax, pulmonary edema, pleural effusion or confluent
pulmonary opacity. Paucity of bowel gas in the upper abdomen. No
acute osseous abnormality identified.
IMPRESSION: No acute cardiopulmonary abnormality.

## 2019-08-13 ENCOUNTER — Ambulatory Visit: Payer: BC Managed Care – PPO | Admitting: Professional

## 2019-08-14 NOTE — Progress Notes (Signed)
Need orders. Pre-op appt is 11-24. Thanks

## 2019-08-14 NOTE — Patient Instructions (Addendum)
DUE TO COVID-19 ONLY ONE VISITOR IS ALLOWED TO COME WITH YOU AND STAY IN THE WAITING ROOM ONLY DURING PRE OP AND PROCEDURE DAY OF SURGERY. THE 1 VISITOR MAY VISIT WITH YOU AFTER SURGERY IN YOUR PRIVATE ROOM DURING VISITING HOURS ONLY!  YOU NEED TO HAVE A COVID 19 TEST ON___11-27___ @___8 :30AM____, THIS TEST MUST BE DONE BEFORE SURGERY, COME  [address] , [id number].  ([place]) ONCE YOUR COVID TEST IS COMPLETED, PLEASE BEGIN THE QUARANTINE INSTRUCTIONS AS OUTLINED IN YOUR HANDOUT.                Cameron Jones    Your procedure is scheduled on: 11-30   Report to [facility] Main  Entrance   PLEASE BRING CPAP MASK AND  TUBING ONLY. DEVICE WILL BE PROVIDED!    Report to SHORT STAY at 5:30AM     Call this number if you have problems the morning of surgery [phone]    Remember: Do not eat food or drink liquids :After Midnight. BRUSH YOUR TEETH MORNING OF SURGERY AND RINSE YOUR MOUTH OUT, NO CHEWING GUM CANDY OR MINTS.     Take these medicines the morning of surgery with A SIP OF WATER: AMLODIPINE, PRILOSEC                                 You may not have any metal on your body including hair pins and              piercings  Do not wear jewelry, make-up, lotions, powders or perfumes, deodorant                       Men may shave face and neck.   Do not bring valuables to the hospital. [place].  Contacts, dentures or bridgework may not be worn into surgery.   YOU MAY BRING A SMALL OVERNIGHT BAG                   Please read over the following fact sheets you were given: _____________________________________________________________________             [facility] - Preparing for Surgery Before surgery, you can play an important role.  Because skin is not sterile, your skin needs to be as free of germs as possible.  You can reduce the number of germs on your skin by washing with  CHG (chlorahexidine gluconate) soap before surgery.  CHG is an antiseptic cleaner which kills germs and bonds with the skin to continue killing germs even after washing. Please DO NOT use if you have an allergy to CHG or antibacterial soaps.  If your skin becomes reddened/irritated stop using the CHG and inform your nurse when you arrive at Short Stay. Do not shave (including legs and underarms) for at least 48 hours prior to the first CHG shower.  You may shave your face/neck. Please follow these instructions carefully:  1.  Shower with CHG Soap the night before surgery and the  morning of Surgery.  2.  If you choose to wash your hair, wash your hair first as usual with your  normal  shampoo.  3.  After you shampoo, rinse your hair and body thoroughly to remove the  shampoo.  4.  Use CHG as you would any other liquid soap.  You can apply chg directly  to the skin and wash                       Gently with a scrungie or clean washcloth.  5.  Apply the CHG Soap to your body ONLY FROM THE NECK DOWN.   Do not use on face/ open                           Wound or open sores. Avoid contact with eyes, ears mouth and genitals (private parts).                       Wash face,  Genitals (private parts) with your normal soap.             6.  Wash thoroughly, paying special attention to the area where your surgery  will be performed.  7.  Thoroughly rinse your body with warm water from the neck down.  8.  DO NOT shower/wash with your normal soap after using and rinsing off  the CHG Soap.                9.  Pat yourself dry with a clean towel.            10.  Wear clean pajamas.            11.  Place clean sheets on your bed the night of your first shower and do not  sleep with pets. Day of Surgery : Do not apply any lotions/deodorants the morning of surgery.  Please wear clean clothes to the hospital/surgery center.  FAILURE TO FOLLOW THESE INSTRUCTIONS MAY RESULT IN THE CANCELLATION  OF YOUR SURGERY PATIENT SIGNATURE_________________________________  NURSE SIGNATURE__________________________________  ________________________________________________________________________

## 2019-08-18 ENCOUNTER — Other Ambulatory Visit: Payer: Self-pay | Admitting: Cardiology

## 2019-08-18 DIAGNOSIS — I10 Essential (primary) hypertension: Secondary | ICD-10-CM

## 2019-08-19 ENCOUNTER — Encounter (HOSPITAL_COMMUNITY)
Admission: RE | Admit: 2019-08-19 | Discharge: 2019-08-19 | Disposition: A | Discharge status: Home / Self Care | Payer: BC Managed Care – PPO | Source: Ambulatory Visit | Attending: Surgery | Admitting: Surgery

## 2019-08-19 ENCOUNTER — Ambulatory Visit (INDEPENDENT_AMBULATORY_CARE_PROVIDER_SITE_OTHER): Payer: BC Managed Care – PPO

## 2019-08-19 ENCOUNTER — Other Ambulatory Visit: Payer: Self-pay

## 2019-08-19 ENCOUNTER — Encounter: Payer: Self-pay | Admitting: Cardiology

## 2019-08-19 ENCOUNTER — Ambulatory Visit (INDEPENDENT_AMBULATORY_CARE_PROVIDER_SITE_OTHER): Payer: BC Managed Care – PPO | Admitting: Cardiology

## 2019-08-19 ENCOUNTER — Encounter (HOSPITAL_COMMUNITY): Payer: Self-pay

## 2019-08-19 VITALS — BP 145/80 | HR 69 | Temp 98.0°F | Ht 72.0 in | Wt >= 6400 oz

## 2019-08-19 DIAGNOSIS — Z01812 Encounter for preprocedural laboratory examination: Secondary | ICD-10-CM | POA: Diagnosis not present

## 2019-08-19 DIAGNOSIS — I10 Essential (primary) hypertension: Secondary | ICD-10-CM | POA: Diagnosis not present

## 2019-08-19 LAB — CBC
HCT: 48.6 % (ref 39.0–52.0)
Hemoglobin: 15.6 g/dL (ref 13.0–17.0)
MCH: 28.9 pg (ref 26.0–34.0)
MCHC: 32.1 g/dL (ref 30.0–36.0)
MCV: 90 fL (ref 80.0–100.0)
Platelets: 195 10*3/uL (ref 150–400)
RBC: 5.4 MIL/uL (ref 4.22–5.81)
RDW: 13.8 % (ref 11.5–15.5)
WBC: 4.7 10*3/uL (ref 4.0–10.5)
nRBC: 0 % (ref 0.0–0.2)

## 2019-08-19 LAB — COMPREHENSIVE METABOLIC PANEL
ALT: 63 U/L — ABNORMAL HIGH (ref 0–44)
AST: 48 U/L — ABNORMAL HIGH (ref 15–41)
Albumin: 4.6 g/dL (ref 3.5–5.0)
Alkaline Phosphatase: 41 U/L (ref 38–126)
Anion gap: 10 (ref 5–15)
BUN: 15 mg/dL (ref 6–20)
CO2: 25 mmol/L (ref 22–32)
Calcium: 9.2 mg/dL (ref 8.9–10.3)
Chloride: 104 mmol/L (ref 98–111)
Creatinine, Ser: 0.83 mg/dL (ref 0.61–1.24)
GFR calc Af Amer: >60 mL/min (ref >60)
GFR calc non Af Amer: >60 mL/min (ref >60)
Glucose, Bld: 98 mg/dL (ref 70–99)
Potassium: 3.8 mmol/L (ref 3.5–5.1)
Sodium: 139 mmol/L (ref 135–145)
Total Bilirubin: 0.8 mg/dL (ref 0.3–1.2)
Total Protein: 7.1 g/dL (ref 6.5–8.1)

## 2019-08-19 NOTE — Progress Notes (Signed)
Patient is here for follow up visit.  Subjective:   Cameron Jones, male    DOB: March 24, 1966, 53 y.o.   MRN: [id number]   Chief Complaint  Patient presents with  . Hypertension  . Follow-up  . Results    echo     HPI  53 year old African-American male with morbid obesity s/p lab band surgery in 2015, h/o colon cancer s/p resection in 2005, hypertension, here for 6 month follow-up.  Patient is doing well and denies chest pain, shortness of breath, palpitations, leg edema, orthopnea, PND, TIA/syncope.  He is getting portable bariatric surgery at [facility] next Monday.  Echocardiogram from today discussed with the patient, details below.  Blood pressure is usually well controlled.  He has cut back on chlorthalidone.   Past Medical History:  Diagnosis Date  . Arthritis    Knee  . Cancer ([place])    colon  . Carpal tunnel syndrome of right wrist    resolved   . Fatty liver    per Dr Radford Pax note  . GERD (gastroesophageal reflux disease)   . Hernia   . History of blood transfusion   . Hypertension   . Lower back pain   . Obesity   . Sleep apnea    severe per report 12/11  on chart  . Ventral hernia      Past Surgical History:  Procedure Laterality Date  . BREATH TEK H PYLORI  11/07/2011   Procedure: BREATH TEK H PYLORI;  Surgeon: Shann Medal, MD;  Location: [facility];  Service: General;  Laterality: N/A;  . BREATH TEK H PYLORI  01/02/2012   Procedure: BREATH TEK H PYLORI;  Surgeon: Pedro Earls, MD;  Location: [facility];  Service: General;  Laterality: N/A;  . COLON SURGERY  06/2006   colectomy  . COLONOSCOPY N/A 05/13/2013   Procedure: COLONOSCOPY;  Surgeon: Winfield Cunas., MD;  Location: [facility];  Service: Endoscopy;  Laterality: N/A;  . COLONOSCOPY N/A 08/30/2017   Procedure: COLONOSCOPY;  Surgeon: Laurence Spates, MD;  Location: [facility];  Service: Endoscopy;  Laterality: N/A;  . LAPAROSCOPIC GASTRIC BANDING  05/07/2012   Procedure: LAPAROSCOPIC GASTRIC BANDING;  Surgeon: Pedro Earls, MD;  Location: [facility];  Service: General;  Laterality: N/A;     Social History   Socioeconomic History  . Marital status: Married    Spouse name: Not on file  . Number of children: 4  . Years of education: Not on file  . Highest education level: Not on file  Occupational History  . Not on file  Social Needs  . Financial resource strain: Not on file  . Food insecurity    Worry: Not on file    Inability: Not on file  . Transportation needs    Medical: Not on file    Non-medical: Not on file  Tobacco Use  . Smoking status: Never Smoker  . Smokeless tobacco: Never Used  Substance and Sexual Activity  . Alcohol use: Yes    Alcohol/week: 1.0 standard drinks    Types: 1 Standard drinks or equivalent per week    Comment: wine  1 glass evry 2-3 months  . Drug use: No  . Sexual activity: Yes    Birth control/protection: None  Lifestyle  . Physical activity    Days per week: Not on file    Minutes per session: Not on file  . Stress: Not on file  Relationships  . Social connections  Talks on phone: Not on file    Gets together: Not on file    Attends religious service: Not on file    Active member of club or organization: Not on file    Attends meetings of clubs or organizations: Not on file    Relationship status: Not on file  . Intimate partner violence    Fear of current or ex partner: Not on file    Emotionally abused: Not on file    Physically abused: Not on file    Forced sexual activity: Not on file  Other Topics Concern  . Not on file  Social History Narrative  . Not on file     Current Outpatient Medications on File Prior to Visit  Medication Sig Dispense Refill  . acetaminophen (TYLENOL) 500 MG tablet Take 500 mg by mouth every 6 (six) hours as needed for moderate pain or headache.    Marland Kitchen amLODipine (NORVASC) 10 MG tablet Take 1 tablet (10 mg total) by mouth daily. (Patient taking  differently: Take 10 mg by mouth daily at 12 noon. ) 90 tablet 3  . cholecalciferol (VITAMIN D3) 25 MCG (1000 UT) tablet Take 1,000 Units by mouth daily.    . ferrous sulfate 325 (65 FE) MG tablet Take 325 mg by mouth daily with breakfast.    . losartan (COZAAR) 25 MG tablet Take 1 tablet (25 mg total) by mouth daily. (Patient taking differently: Take 25 mg by mouth daily at 12 noon. ) 90 tablet 0  . Multiple Vitamins-Minerals (MULTIVITAMIN WITH MINERALS) tablet Take 1 tablet by mouth daily.     Marland Kitchen omeprazole (PRILOSEC OTC) 20 MG tablet Take 20 mg by mouth daily.    . chlorthalidone (HYGROTON) 25 MG tablet TAKE 1 TABLET BY MOUTH EVERY DAY (Patient not taking: No sig reported) 90 tablet 1   No current facility-administered medications on file prior to visit.     Cardiovascular studies:  Echocardiogram 08/19/2019: Left ventricle cavity is normal in size. Mild concentric hypertrophy of the left ventricle. Normal LV systolic function with EF 56%. Normal global wall motion. Normal diastolic filling pattern. No significant valvular abnormality. Normal right atrial pressure.   EKG 11/22/2018: Sinus  Rhythm  Normal EKG   Labs 07/03/2018: Glucose 102.  BUN/creatinine 15/0.98.  EGFR normal.  Sodium 140, potassium 3.7.  Labs 06/17/2018: Glucose 106. BUN/Cr 15/1.08. eGFR 80. Na/K 140/3.3  Labs 05/14/2018: Glucose 106.  BUN/creatinine 11/1.0.  EGFR 79/95.  Sodium 141, potassium 4.2. ALT 66, mildly elevated H/H 15/50. MCV 89. Platelets 212.  Chol 173, TG 123, HDL 40, LDL 108  Review of Systems  Constitution: Negative for decreased appetite, malaise/fatigue, weight gain and weight loss.  HENT: Negative for congestion.   Eyes: Negative for visual disturbance.  Cardiovascular: Negative for chest pain, claudication, dyspnea on exertion, leg swelling, palpitations and syncope.  Respiratory: Negative for shortness of breath.   Endocrine: Negative for cold intolerance.  Hematologic/Lymphatic: Does  not bruise/bleed easily.  Skin: Negative for itching and rash.  Musculoskeletal: Negative for myalgias.  Gastrointestinal: Negative for abdominal pain, nausea and vomiting.  Genitourinary: Negative for dysuria.  Neurological: Negative for dizziness and weakness.  Psychiatric/Behavioral: The patient is not nervous/anxious.   All other systems reviewed and are negative.      Objective:    Vitals:   08/19/19 1502  BP: (!) 145/80  Pulse: 69  Temp: 98 F (36.7 C)  SpO2: 98%     Physical Exam  Constitutional: He is oriented to  person, place, and time. He appears well-developed and well-nourished. No distress.  Morbidly obese  HENT:  Head: Normocephalic and atraumatic.  Eyes: Pupils are equal, round, and reactive to light. Conjunctivae are normal.  Neck: No JVD present.  Cardiovascular: Normal rate, regular rhythm and intact distal pulses.  No murmur heard. Pulmonary/Chest: Effort normal and breath sounds normal. He has no wheezes. He has no rales.  Abdominal: Soft. Bowel sounds are normal. There is no rebound.  Laparotomy scar  Musculoskeletal:        General: No edema.  Lymphadenopathy:    He has no cervical adenopathy.  Neurological: He is alert and oriented to person, place, and time. No cranial nerve deficit.  Skin: Skin is warm and dry.  Psychiatric: He has a normal mood and affect.  Nursing note and vitals reviewed.       Assessment & Recommendations:   53 year old African-American male with morbid obesity s/p lab band surgery in 2015, h/o colon cancer s/p resection in 2005, hypertension, here for 6 month follow-up.   Hypertension: Well-controlled.  Continue current antihypertensive therapy.  I do expect his blood pressure to decrease post surgery, and he may need adjustment of his antihypertensive therapy.  Morbid obesity: Essentially normal echocardiogram.  With good baseline functional capacity without angina, is perioperative cardiac risk for bariatric  surgery is low.   OSA: Continue CPAP use  I will see him back in 6 months.    Nigel Mormon, MD [facility] Pager: [phone] Office: [phone] If no answer Cell [phone]

## 2019-08-19 NOTE — Progress Notes (Addendum)
PCP - Tisovec, Fransico Him, MD Cardiologist - Patwardhan, Reynold Bowen, MD, Cassell Clement 11-22-2018 epic   Chest x-ray - epic 08-2018 EKG - epic 11-22-2018 Stress Test -  ECHO -  Cardiac Cath -   Sleep Study -  CPAP -   Fasting Blood Sugar -  Checks Blood Sugar _____ times a day  Blood Thinner Instructions: Aspirin Instructions: Last Dose:  Anesthesia review:  Patient to undergo ECHO today at Dr Robb Matar office for upcoming gastric surgery .  chart to PA to review ECHO results when available   Patient denies shortness of breath, fever, cough and chest pain at PAT appointment   Patient verbalized understanding of instructions that were given to them at the PAT appointment. Patient was also instructed that they will need to review over the PAT instructions again at home before surgery.

## 2019-08-20 NOTE — Progress Notes (Signed)
Anesthesia Chart Review   Case: [id number] Date/Time: 08/25/19 0700   Procedure: LAPAROSCOPIC GASTRIC BAND REMOVAL WITH LAPAROSCOPIC GASTRIC SLEEVE RESECTION, Upper Endo, ERAS Pathway (N/A )   Anesthesia type: General   Pre-op diagnosis: s/p Lap Band status, HTN, Lap Band Malfunction   Location: [facility] [place]   Surgeon: Johnathan Hausen, MD      DISCUSSION:53 y.o. never smoker with h/o GERD, HTN, sleep apnea, s/p lap band with malfunction scheduled for above procedure 08/25/2019 with Dr. Johnathan Hausen.   Pt last seen by cardiologist, Dr. Virgina Jock, 08/19/2019.  Per OV note, "Essentially normal echocardiogram.  With good baseline functional capacity without angina, is perioperative cardiac risk for bariatric surgery is low."  Anticipate pt can proceed with planned procedure barring acute status change.   VS: BP 139/70 (BP Location: Right Arm)   Pulse 60   Temp 36.7 C (Oral)   Resp 18   Ht 6' (1.829 m)   Wt (!) 186.1 kg   SpO2 100%   BMI 55.64 kg/m   PROVIDERS: Tisovec, Fransico Him, MD is PCP   Vernell Leep, MD is Cardiologist  LABS: Labs reviewed: Acceptable for surgery. (all labs ordered are listed, but only abnormal results are displayed)  Labs Reviewed  COMPREHENSIVE METABOLIC PANEL - Abnormal; Notable for the following components:      Result Value   AST 48 (*)    ALT 63 (*)    All other components within normal limits  CBC     IMAGES:   EKG: 11/22/2018 Rate 85 bpm Sinus rhythm   CV: Echocardiogram 08/19/2019: Left ventricle cavity is normal in size. Mild concentric hypertrophy of the left ventricle. Normal LV systolic function with EF 56%. Normal global wall motion. Normal diastolic filling pattern. No significant valvular abnormality. Normal right atrial pressure.  Past Medical History:  Diagnosis Date  . Arthritis    Knee  . Cancer ([place])    colon  . Carpal tunnel syndrome of right wrist    resolved   . Fatty liver    per Dr Radford Pax note   . GERD (gastroesophageal reflux disease)   . Hernia   . History of blood transfusion   . Hypertension   . Lower back pain   . Obesity   . Sleep apnea    severe per report 12/11  on chart  . Ventral hernia     Past Surgical History:  Procedure Laterality Date  . BREATH TEK H PYLORI  11/07/2011   Procedure: BREATH TEK H PYLORI;  Surgeon: Shann Medal, MD;  Location: [facility];  Service: General;  Laterality: N/A;  . BREATH TEK H PYLORI  01/02/2012   Procedure: BREATH TEK H PYLORI;  Surgeon: Pedro Earls, MD;  Location: [facility];  Service: General;  Laterality: N/A;  . COLON SURGERY  06/2006   colectomy  . COLONOSCOPY N/A 05/13/2013   Procedure: COLONOSCOPY;  Surgeon: Winfield Cunas., MD;  Location: [facility];  Service: Endoscopy;  Laterality: N/A;  . COLONOSCOPY N/A 08/30/2017   Procedure: COLONOSCOPY;  Surgeon: Laurence Spates, MD;  [facility]: [facility];  Service: Endoscopy;  Laterality: N/A;  . LAPAROSCOPIC GASTRIC BANDING  05/07/2012   Procedure: LAPAROSCOPIC GASTRIC BANDING;  Surgeon: Pedro Earls, MD;  Location: [facility];  Service: General;  Laterality: N/A;    MEDICATIONS: . acetaminophen (TYLENOL) 500 MG tablet  . amLODipine (NORVASC) 10 MG tablet  . chlorthalidone (HYGROTON) 25 MG tablet  . cholecalciferol (VITAMIN D3) 25  MCG (1000 UT) tablet  . ferrous sulfate 325 (65 FE) MG tablet  . losartan (COZAAR) 25 MG tablet  . Multiple Vitamins-Minerals (MULTIVITAMIN WITH MINERALS) tablet  . omeprazole (PRILOSEC OTC) 20 MG tablet   No current facility-administered medications for this encounter.      Maia Plan [facility] Pre-Surgical Testing [phone] 08/20/19  11:11 AM

## 2019-08-20 NOTE — H&P (Signed)
Chief Complaint:  Failed lapband for removal and conversion to sleeve gastrectomy  History of Present Illness:  Cameron Jones is an 53 y.o. male had lapband in 2013.  He had a prior colectomy and we placed his port on the left side.  He presents for removal of lapband with hopeful conversion to sleeve gastrectomy.    Past Medical History:  Diagnosis Date  . Arthritis    Knee  . Cancer ([place])    colon  . Carpal tunnel syndrome of right wrist    resolved   . Fatty liver    per Dr Radford Pax note  . GERD (gastroesophageal reflux disease)   . Hernia   . History of blood transfusion   . Hypertension   . Lower back pain   . Obesity   . Sleep apnea    severe per report 12/11  on chart  . Ventral hernia     Past Surgical History:  Procedure Laterality Date  . BREATH TEK H PYLORI  11/07/2011   Procedure: BREATH TEK H PYLORI;  Surgeon: Shann Medal, MD;  Location: [facility];  Service: General;  Laterality: N/A;  . BREATH TEK H PYLORI  01/02/2012   Procedure: BREATH TEK H PYLORI;  Surgeon: Pedro Earls, MD;  Location: [facility];  Service: General;  Laterality: N/A;  . COLON SURGERY  06/2006   colectomy  . COLONOSCOPY N/A 05/13/2013   Procedure: COLONOSCOPY;  Surgeon: Winfield Cunas., MD;  Location: [facility];  Service: Endoscopy;  Laterality: N/A;  . COLONOSCOPY N/A 08/30/2017   Procedure: COLONOSCOPY;  Surgeon: Laurence Spates, MD;  [facility];  Service: Endoscopy;  Laterality: N/A;  . LAPAROSCOPIC GASTRIC BANDING  05/07/2012   Procedure: LAPAROSCOPIC GASTRIC BANDING;  Surgeon: Pedro Earls, MD;  Location[facility];  Service: General;  Laterality: N/A;    No current facility-administered medications for this encounter.    Current Outpatient Medications  Medication Sig Dispense Refill  . acetaminophen (TYLENOL) 500 MG tablet Take 500 mg by mouth every 6 (six) hours as needed for moderate pain or headache.    Marland Kitchen amLODipine (NORVASC) 10 MG tablet Take 1 tablet  (10 mg total) by mouth daily. (Patient taking differently: Take 10 mg by mouth daily at 12 noon. ) 90 tablet 3  . chlorthalidone (HYGROTON) 25 MG tablet TAKE 1 TABLET BY MOUTH EVERY DAY (Patient not taking: No sig reported) 90 tablet 1  . cholecalciferol (VITAMIN D3) 25 MCG (1000 UT) tablet Take 1,000 Units by mouth daily.    . ferrous sulfate 325 (65 FE) MG tablet Take 325 mg by mouth daily with breakfast.    . losartan (COZAAR) 25 MG tablet Take 1 tablet (25 mg total) by mouth daily. (Patient taking differently: Take 25 mg by mouth daily at 12 noon. ) 90 tablet 0  . Multiple Vitamins-Minerals (MULTIVITAMIN WITH MINERALS) tablet Take 1 tablet by mouth daily.     Marland Kitchen omeprazole (PRILOSEC OTC) 20 MG tablet Take 20 mg by mouth daily.     Patient has no known allergies. Family History  Problem Relation Age of Onset  . Cancer Mother        pancreatic  . Cancer Brother        colon  . Alzheimer's disease Father   . Hypertension Sister   . Cancer Maternal Aunt        breast   Social History:   reports that he has never smoked. He has never used smokeless  tobacco. He reports current alcohol use of about 1.0 standard drinks of alcohol per week. He reports that he does not use drugs.   REVIEW OF SYSTEMS : Negative except for see problem list  Physical Exam:   There were no vitals taken for this visit. There is no height or weight on file to calculate BMI.  Gen:  WDWN AAM NAD  Neurological: Alert and oriented to person, place, and time. Motor and sensory function is grossly intact  Head: Normocephalic and atraumatic.  Eyes: Conjunctivae are normal. Pupils are equal, round, and reactive to light. No scleral icterus.  Neck: Normal range of motion. Neck supple. No tracheal deviation or thyromegaly present.  Cardiovascular:  SR without murmurs or gallops.  No carotid bruits Breast:  Not examined Respiratory: Effort normal.  No respiratory distress. No chest wall tenderness. Breath sounds normal.   No wheezes, rales or rhonchi.  Abdomen:  Port on the left and marked by me.  Lower midline incision from colectomy for cancer 2007 GU:  unremarkable Musculoskeletal: Normal range of motion. Extremities are nontender. No cyanosis, edema or clubbing noted Lymphadenopathy: No cervical, preauricular, postauricular or axillary adenopathy is present Skin: Skin is warm and dry. No rash noted. No diaphoresis. No erythema. No pallor. Pscyh: Normal mood and affect. Behavior is normal. Judgment and thought content normal.   LABORATORY RESULTS: Results for orders placed or performed during the hospital encounter of 08/19/19 (from the past 48 hour(s))  Comprehensive metabolic panel     Status: Abnormal   Collection Time: 08/19/19  8:40 AM  Result Value Ref Range   Sodium 139 135 - 145 mmol/L   Potassium 3.8 3.5 - 5.1 mmol/L   Chloride 104 98 - 111 mmol/L   CO2 25 22 - 32 mmol/L   Glucose, Bld 98 70 - 99 mg/dL   BUN 15 6 - 20 mg/dL   Creatinine, Ser 0.83 0.61 - 1.24 mg/dL   Calcium 9.2 8.9 - 10.3 mg/dL   Total Protein 7.1 6.5 - 8.1 g/dL   Albumin 4.6 3.5 - 5.0 g/dL   AST 48 (H) 15 - 41 U/L   ALT 63 (H) 0 - 44 U/L   Alkaline Phosphatase 41 38 - 126 U/L   Total Bilirubin 0.8 0.3 - 1.2 mg/dL   GFR calc non Af Amer >60 >60 mL/min   GFR calc Af Amer >60 >60 mL/min   Anion gap 10 5 - 15    Comment: Performed at [facility], [place] [address] [place], [place] [address]  CBC     Status: None   Collection Time: 08/19/19  8:40 AM  Result Value Ref Range   WBC 4.7 4.0 - 10.5 K/uL   RBC 5.40 4.22 - 5.81 MIL/uL   Hemoglobin 15.6 13.0 - 17.0 g/dL   HCT 48.6 39.0 - 52.0 %   MCV 90.0 80.0 - 100.0 fL   MCH 28.9 26.0 - 34.0 pg   MCHC 32.1 30.0 - 36.0 g/dL   RDW 13.8 11.5 - 15.5 %   Platelets 195 150 - 400 K/uL   nRBC 0.0 0.0 - 0.2 %    Comment: Performed at [facility], [place] [address] [place], [place] [address]     RADIOLOGY RESULTS: No results  found.  Problem List: Patient Active Problem List   Diagnosis Date Noted  . Essential hypertension 11/23/2018  . OSA (obstructive sleep apnea) 07/26/2017  . Partial small bowel obstruction ([place]) 12/27/2016  . Lapband APL  August 2013 05/07/2012  . Colon cancer-right T3 N0, Oct 2007 10/06/2011  . Ventral hernia-midline 10/06/2011  . Obstructive sleep apnea of adult 10/06/2011  . Morbid obesity ([place]) 10/06/2011    Assessment & Plan: Failure of lapband with planned removal and conversion to sleeve gastrectomy.      Matt B. Hassell Done, MD, [facility] [phone] beeper [phone]  08/20/2019 11:38 AM

## 2019-08-20 NOTE — Anesthesia Preprocedure Evaluation (Addendum)
Anesthesia Evaluation  Patient identified by MRN, date of birth, ID band Patient awake    Reviewed: Allergy & Precautions, NPO status , Patient's Chart, lab work & pertinent test results  Airway Mallampati: II  TM Distance: >3 FB Neck ROM: Full    Dental no notable dental hx. (+) Teeth Intact, Dental Advisory Given   Pulmonary sleep apnea and Continuous Positive Airway Pressure Ventilation ,    Pulmonary exam normal breath sounds clear to auscultation       Cardiovascular hypertension, Pt. on medications Normal cardiovascular exam Rhythm:Regular Rate:Normal     Neuro/Psych    GI/Hepatic GERD  Medicated,  Endo/Other  Morbid obesity  Renal/GU      Musculoskeletal   Abdominal   Peds  Hematology   Anesthesia Other Findings   Reproductive/Obstetrics                           Anesthesia Physical Anesthesia Plan  ASA: III  Anesthesia Plan: General   Post-op Pain Management:    Induction: Intravenous  PONV Risk Score and Plan: 3 and Treatment may vary due to age or medical condition, Ondansetron, Dexamethasone and Midazolam  Airway Management Planned: Video Laryngoscope Planned and Oral ETT  Additional Equipment: None  Intra-op Plan:   Post-operative Plan: Extubation in OR  Informed Consent: I have reviewed the patients History and Physical, chart, labs and discussed the procedure including the risks, benefits and alternatives for the proposed anesthesia with the patient or authorized representative who has indicated his/her understanding and acceptance.     Dental advisory given  Plan Discussed with:   Anesthesia Plan Comments: (See PAT note 08/19/2019, Konrad Felix, PA-C  Ga w lidocaine infusion + Ketamine 0.3mg /kg )      Anesthesia Quick Evaluation

## 2019-08-22 ENCOUNTER — Other Ambulatory Visit (HOSPITAL_COMMUNITY)
Admission: RE | Admit: 2019-08-22 | Discharge: 2019-08-22 | Disposition: A | Discharge status: Home / Self Care | Payer: BC Managed Care – PPO | Source: Ambulatory Visit | Attending: Surgery | Admitting: Surgery

## 2019-08-22 DIAGNOSIS — Z20828 Contact with and (suspected) exposure to other viral communicable diseases: Secondary | ICD-10-CM | POA: Diagnosis not present

## 2019-08-22 DIAGNOSIS — Z01812 Encounter for preprocedural laboratory examination: Secondary | ICD-10-CM | POA: Insufficient documentation

## 2019-08-22 LAB — SARS CORONAVIRUS 2 (TAT 6-24 HRS): SARS Coronavirus 2: NEGATIVE

## 2019-08-23 ENCOUNTER — Other Ambulatory Visit (HOSPITAL_COMMUNITY): Payer: BC Managed Care – PPO

## 2019-08-24 MED ORDER — BUPIVACAINE LIPOSOME 1.3 % IJ SUSP
20.0000 mL | INTRAMUSCULAR | Status: DC
  Filled 2019-08-24: qty 20

## 2019-08-25 ENCOUNTER — Inpatient Hospital Stay (HOSPITAL_COMMUNITY): Payer: BC Managed Care – PPO | Admitting: Certified Registered Nurse Anesthetist

## 2019-08-25 ENCOUNTER — Encounter (HOSPITAL_COMMUNITY)
Admission: RE | Disposition: A | Discharge status: Home / Self Care | Payer: Self-pay | Source: Home / Self Care | Attending: Surgery

## 2019-08-25 ENCOUNTER — Inpatient Hospital Stay (HOSPITAL_COMMUNITY)
Admission: RE | Admit: 2019-08-25 | Discharge: 2019-08-27 | DRG: 621 | Disposition: A | Discharge status: Home / Self Care | Payer: BC Managed Care – PPO | Attending: Surgery | Admitting: Surgery

## 2019-08-25 ENCOUNTER — Encounter (HOSPITAL_COMMUNITY): Payer: Self-pay | Admitting: *Deleted

## 2019-08-25 ENCOUNTER — Inpatient Hospital Stay (HOSPITAL_COMMUNITY): Payer: BC Managed Care – PPO | Admitting: Physician Assistant

## 2019-08-25 ENCOUNTER — Ambulatory Visit: Payer: BC Managed Care – PPO | Admitting: Professional

## 2019-08-25 ENCOUNTER — Other Ambulatory Visit: Payer: Self-pay

## 2019-08-25 DIAGNOSIS — K760 Fatty (change of) liver, not elsewhere classified: Secondary | ICD-10-CM | POA: Diagnosis present

## 2019-08-25 DIAGNOSIS — K432 Incisional hernia without obstruction or gangrene: Secondary | ICD-10-CM | POA: Diagnosis not present

## 2019-08-25 DIAGNOSIS — Z803 Family history of malignant neoplasm of breast: Secondary | ICD-10-CM

## 2019-08-25 DIAGNOSIS — K430 Incisional hernia with obstruction, without gangrene: Secondary | ICD-10-CM | POA: Diagnosis not present

## 2019-08-25 DIAGNOSIS — Z820 Family history of epilepsy and other diseases of the nervous system: Secondary | ICD-10-CM

## 2019-08-25 DIAGNOSIS — Z800 Family history of malignant neoplasm of digestive organs: Secondary | ICD-10-CM

## 2019-08-25 DIAGNOSIS — Z9884 Bariatric surgery status: Secondary | ICD-10-CM

## 2019-08-25 DIAGNOSIS — K660 Peritoneal adhesions (postprocedural) (postinfection): Secondary | ICD-10-CM | POA: Diagnosis not present

## 2019-08-25 DIAGNOSIS — M545 Low back pain: Secondary | ICD-10-CM | POA: Diagnosis not present

## 2019-08-25 DIAGNOSIS — Z85038 Personal history of other malignant neoplasm of large intestine: Secondary | ICD-10-CM | POA: Diagnosis not present

## 2019-08-25 DIAGNOSIS — I10 Essential (primary) hypertension: Secondary | ICD-10-CM | POA: Diagnosis present

## 2019-08-25 DIAGNOSIS — K219 Gastro-esophageal reflux disease without esophagitis: Secondary | ICD-10-CM | POA: Diagnosis not present

## 2019-08-25 DIAGNOSIS — G4733 Obstructive sleep apnea (adult) (pediatric): Secondary | ICD-10-CM | POA: Diagnosis not present

## 2019-08-25 DIAGNOSIS — Z6841 Body Mass Index (BMI) 40.0 and over, adult: Secondary | ICD-10-CM

## 2019-08-25 DIAGNOSIS — K9509 Other complications of gastric band procedure: Secondary | ICD-10-CM | POA: Diagnosis not present

## 2019-08-25 HISTORY — PX: LAPAROSCOPIC GASTRIC BAND REMOVAL WITH LAPAROSCOPIC GASTRIC SLEEVE RESECTION: SHX6498

## 2019-08-25 HISTORY — PX: INGUINAL HERNIA REPAIR: SHX194

## 2019-08-25 HISTORY — PX: LAPAROSCOPIC LYSIS OF ADHESIONS: SHX5905

## 2019-08-25 LAB — TYPE AND SCREEN
ABO/RH(D): O POS
Antibody Screen: NEGATIVE

## 2019-08-25 LAB — HEMOGLOBIN AND HEMATOCRIT, BLOOD
HCT: 47.3 % (ref 39.0–52.0)
Hemoglobin: 15.3 g/dL (ref 13.0–17.0)

## 2019-08-25 SURGERY — LAPAROSCOPIC GASTRIC BAND REMOVAL WITH LAPAROSCOPIC GASTRIC SLEEVE RESECTION
Anesthesia: General | Site: Abdomen

## 2019-08-25 MED ORDER — ACETAMINOPHEN 160 MG/5ML PO SOLN: 1000.0000 mg | Freq: Three times a day (TID) | ORAL | Status: DC

## 2019-08-25 MED ORDER — MIDAZOLAM HCL 5 MG/5ML IJ SOLN
INTRAMUSCULAR | Status: DC | PRN
  Administered 2019-08-25: 2 mg via INTRAVENOUS

## 2019-08-25 MED ORDER — KETAMINE HCL 10 MG/ML IJ SOLN
INTRAMUSCULAR | Status: DC | PRN
  Administered 2019-08-25: 30 mg via INTRAVENOUS
  Administered 2019-08-25 (×2): 10 mg via INTRAVENOUS

## 2019-08-25 MED ORDER — LIDOCAINE 2% (20 MG/ML) 5 ML SYRINGE
INTRAMUSCULAR | Status: DC | PRN
  Administered 2019-08-25: 1.5 mg/kg/h via INTRAVENOUS

## 2019-08-25 MED ORDER — PROPOFOL 10 MG/ML IV BOLUS
INTRAVENOUS | Status: AC
  Filled 2019-08-25: qty 20

## 2019-08-25 MED ORDER — MORPHINE SULFATE (PF) 2 MG/ML IV SOLN
1.0000 mg | INTRAVENOUS | Status: DC | PRN
  Filled 2019-08-25: qty 1

## 2019-08-25 MED ORDER — DEXAMETHASONE SODIUM PHOSPHATE 10 MG/ML IJ SOLN
INTRAMUSCULAR | Status: AC
  Filled 2019-08-25: qty 1

## 2019-08-25 MED ORDER — ROCURONIUM BROMIDE 10 MG/ML (PF) SYRINGE
PREFILLED_SYRINGE | INTRAVENOUS | Status: AC
  Filled 2019-08-25: qty 10

## 2019-08-25 MED ORDER — SUGAMMADEX SODIUM 500 MG/5ML IV SOLN
INTRAVENOUS | Status: AC
  Filled 2019-08-25: qty 5

## 2019-08-25 MED ORDER — METOPROLOL TARTRATE 5 MG/5ML IV SOLN: 5.0000 mg | Freq: Four times a day (QID) | INTRAVENOUS | Status: DC | PRN

## 2019-08-25 MED ORDER — HEPARIN SODIUM (PORCINE) 5000 UNIT/ML IJ SOLN
5000.0000 [IU] | INTRAMUSCULAR | Status: AC
  Administered 2019-08-25: 5000 [IU] via SUBCUTANEOUS
  Filled 2019-08-25: qty 1

## 2019-08-25 MED ORDER — LACTATED RINGERS IR SOLN
Status: DC | PRN
  Administered 2019-08-25: 1000 mL

## 2019-08-25 MED ORDER — ONDANSETRON HCL 4 MG/2ML IJ SOLN
INTRAMUSCULAR | Status: DC | PRN
  Administered 2019-08-25 (×2): 4 mg via INTRAVENOUS

## 2019-08-25 MED ORDER — PHENYLEPHRINE 40 MCG/ML (10ML) SYRINGE FOR IV PUSH (FOR BLOOD PRESSURE SUPPORT)
PREFILLED_SYRINGE | INTRAVENOUS | Status: AC
  Filled 2019-08-25: qty 20

## 2019-08-25 MED ORDER — GABAPENTIN 300 MG PO CAPS
300.0000 mg | ORAL_CAPSULE | ORAL | Status: AC
  Administered 2019-08-25: 300 mg via ORAL
  Filled 2019-08-25: qty 1

## 2019-08-25 MED ORDER — OXYCODONE HCL 5 MG PO TABS: 5.0000 mg | ORAL_TABLET | Freq: Once | ORAL | Status: DC | PRN

## 2019-08-25 MED ORDER — ROCURONIUM BROMIDE 50 MG/5ML IV SOSY
PREFILLED_SYRINGE | INTRAVENOUS | Status: DC | PRN
  Administered 2019-08-25 (×5): 20 mg via INTRAVENOUS
  Administered 2019-08-25: 70 mg via INTRAVENOUS
  Administered 2019-08-25: 10 mg via INTRAVENOUS
  Administered 2019-08-25: 20 mg via INTRAVENOUS

## 2019-08-25 MED ORDER — FENTANYL CITRATE (PF) 100 MCG/2ML IJ SOLN: 25.0000 ug | INTRAMUSCULAR | Status: DC | PRN

## 2019-08-25 MED ORDER — LACTATED RINGERS IV SOLN
INTRAVENOUS | Status: DC
  Administered 2019-08-25 (×4): via INTRAVENOUS

## 2019-08-25 MED ORDER — CHLORHEXIDINE GLUCONATE CLOTH 2 % EX PADS: 6.0000 | MEDICATED_PAD | Freq: Once | CUTANEOUS | Status: DC

## 2019-08-25 MED ORDER — LIDOCAINE 2% (20 MG/ML) 5 ML SYRINGE
INTRAMUSCULAR | Status: DC | PRN
  Administered 2019-08-25: 100 mg via INTRAVENOUS

## 2019-08-25 MED ORDER — LIDOCAINE HCL 2 % IJ SOLN
INTRAMUSCULAR | Status: AC
  Filled 2019-08-25: qty 20

## 2019-08-25 MED ORDER — KETAMINE HCL 10 MG/ML IJ SOLN
INTRAMUSCULAR | Status: AC
  Filled 2019-08-25: qty 1

## 2019-08-25 MED ORDER — FENTANYL CITRATE (PF) 100 MCG/2ML IJ SOLN
INTRAMUSCULAR | Status: AC
  Filled 2019-08-25: qty 2

## 2019-08-25 MED ORDER — LIDOCAINE 2% (20 MG/ML) 5 ML SYRINGE
INTRAMUSCULAR | Status: AC
  Filled 2019-08-25: qty 5

## 2019-08-25 MED ORDER — PHENYLEPHRINE HCL (PRESSORS) 10 MG/ML IV SOLN
INTRAVENOUS | Status: AC
  Filled 2019-08-25: qty 1

## 2019-08-25 MED ORDER — ONDANSETRON HCL 4 MG/2ML IJ SOLN
INTRAMUSCULAR | Status: AC
  Filled 2019-08-25: qty 2

## 2019-08-25 MED ORDER — HEPARIN SODIUM (PORCINE) 5000 UNIT/ML IJ SOLN
5000.0000 [IU] | Freq: Three times a day (TID) | INTRAMUSCULAR | Status: DC
  Administered 2019-08-25 – 2019-08-27 (×7): 5000 [IU] via SUBCUTANEOUS
  Filled 2019-08-25 (×7): qty 1

## 2019-08-25 MED ORDER — APREPITANT 40 MG PO CAPS
40.0000 mg | ORAL_CAPSULE | ORAL | Status: AC
  Administered 2019-08-25: 40 mg via ORAL
  Filled 2019-08-25: qty 1

## 2019-08-25 MED ORDER — DEXAMETHASONE SODIUM PHOSPHATE 10 MG/ML IJ SOLN
INTRAMUSCULAR | Status: DC | PRN
  Administered 2019-08-25: 8 mg via INTRAVENOUS

## 2019-08-25 MED ORDER — SUCCINYLCHOLINE CHLORIDE 200 MG/10ML IV SOSY
PREFILLED_SYRINGE | INTRAVENOUS | Status: AC
  Filled 2019-08-25: qty 10

## 2019-08-25 MED ORDER — ONDANSETRON HCL 4 MG/2ML IJ SOLN: 4.0000 mg | Freq: Once | INTRAMUSCULAR | Status: DC | PRN

## 2019-08-25 MED ORDER — FENTANYL CITRATE (PF) 250 MCG/5ML IJ SOLN
INTRAMUSCULAR | Status: AC
  Filled 2019-08-25: qty 5

## 2019-08-25 MED ORDER — ACETAMINOPHEN 500 MG PO TABS: 1000.0000 mg | ORAL_TABLET | Freq: Once | ORAL | Status: DC

## 2019-08-25 MED ORDER — MIDAZOLAM HCL 2 MG/2ML IJ SOLN
INTRAMUSCULAR | Status: AC
  Filled 2019-08-25: qty 2

## 2019-08-25 MED ORDER — ACETAMINOPHEN 10 MG/ML IV SOLN
1000.0000 mg | Freq: Once | INTRAVENOUS | Status: DC | PRN
  Administered 2019-08-25: 14:00:00 1000 mg via INTRAVENOUS

## 2019-08-25 MED ORDER — SCOPOLAMINE 1 MG/3DAYS TD PT72
1.0000 | MEDICATED_PATCH | TRANSDERMAL | Status: DC
  Administered 2019-08-25: 1.5 mg via TRANSDERMAL
  Filled 2019-08-25: qty 1

## 2019-08-25 MED ORDER — PANTOPRAZOLE SODIUM 40 MG IV SOLR
40.0000 mg | Freq: Every day | INTRAVENOUS | Status: DC
  Administered 2019-08-25 – 2019-08-26 (×2): 40 mg via INTRAVENOUS
  Filled 2019-08-25 (×3): qty 40

## 2019-08-25 MED ORDER — EPHEDRINE SULFATE-NACL 50-0.9 MG/10ML-% IV SOSY
PREFILLED_SYRINGE | INTRAVENOUS | Status: DC | PRN
  Administered 2019-08-25: 10 mg via INTRAVENOUS

## 2019-08-25 MED ORDER — ENOXAPARIN (LOVENOX) PATIENT EDUCATION KIT
PACK | Freq: Once | Status: AC
  Administered 2019-08-25: 16:00:00
  Filled 2019-08-25: qty 1

## 2019-08-25 MED ORDER — OXYCODONE HCL 5 MG/5ML PO SOLN
5.0000 mg | Freq: Four times a day (QID) | ORAL | Status: DC | PRN
  Administered 2019-08-26: 5 mg via ORAL
  Filled 2019-08-25 (×2): qty 5

## 2019-08-25 MED ORDER — OXYCODONE HCL 5 MG/5ML PO SOLN: 5.0000 mg | Freq: Once | ORAL | Status: DC | PRN

## 2019-08-25 MED ORDER — BUPIVACAINE LIPOSOME 1.3 % IJ SUSP
INTRAMUSCULAR | Status: DC | PRN
  Administered 2019-08-25: 20 mL

## 2019-08-25 MED ORDER — PHENYLEPHRINE HCL-NACL 10-0.9 MG/250ML-% IV SOLN
INTRAVENOUS | Status: DC | PRN
  Administered 2019-08-25: 40 ug/min via INTRAVENOUS

## 2019-08-25 MED ORDER — FENTANYL CITRATE (PF) 100 MCG/2ML IJ SOLN
INTRAMUSCULAR | Status: DC | PRN
  Administered 2019-08-25 (×5): 50 ug via INTRAVENOUS
  Administered 2019-08-25: 100 ug via INTRAVENOUS

## 2019-08-25 MED ORDER — ENSURE MAX PROTEIN PO LIQD
2.0000 [oz_av] | ORAL | Status: DC
  Administered 2019-08-26 – 2019-08-27 (×5): 2 [oz_av] via ORAL

## 2019-08-25 MED ORDER — ACETAMINOPHEN 500 MG PO TABS
1000.0000 mg | ORAL_TABLET | ORAL | Status: AC
  Administered 2019-08-25: 1000 mg via ORAL
  Filled 2019-08-25: qty 2

## 2019-08-25 MED ORDER — KCL IN DEXTROSE-NACL 20-5-0.45 MEQ/L-%-% IV SOLN
INTRAVENOUS | Status: DC
  Administered 2019-08-25 – 2019-08-27 (×3): via INTRAVENOUS
  Filled 2019-08-25 (×4): qty 1000

## 2019-08-25 MED ORDER — PHENYLEPHRINE 40 MCG/ML (10ML) SYRINGE FOR IV PUSH (FOR BLOOD PRESSURE SUPPORT)
PREFILLED_SYRINGE | INTRAVENOUS | Status: DC | PRN
  Administered 2019-08-25 (×4): 120 ug via INTRAVENOUS
  Administered 2019-08-25: 40 ug via INTRAVENOUS

## 2019-08-25 MED ORDER — ACETAMINOPHEN 500 MG PO TABS
1000.0000 mg | ORAL_TABLET | Freq: Three times a day (TID) | ORAL | Status: DC
  Administered 2019-08-25 – 2019-08-27 (×6): 1000 mg via ORAL
  Filled 2019-08-25 (×6): qty 2

## 2019-08-25 MED ORDER — SUGAMMADEX SODIUM 500 MG/5ML IV SOLN
INTRAVENOUS | Status: DC | PRN
  Administered 2019-08-25: 500 mg via INTRAVENOUS

## 2019-08-25 MED ORDER — SODIUM CHLORIDE 0.9 % IV SOLN
2.0000 g | INTRAVENOUS | Status: AC
  Administered 2019-08-25: 2 g via INTRAVENOUS
  Filled 2019-08-25: qty 2

## 2019-08-25 MED ORDER — ACETAMINOPHEN 10 MG/ML IV SOLN
INTRAVENOUS | Status: AC
  Administered 2019-08-25: 1000 mg via INTRAVENOUS
  Filled 2019-08-25: qty 100

## 2019-08-25 MED ORDER — ONDANSETRON HCL 4 MG/2ML IJ SOLN
4.0000 mg | INTRAMUSCULAR | Status: DC | PRN
  Filled 2019-08-25: qty 2

## 2019-08-25 MED ORDER — PROPOFOL 10 MG/ML IV BOLUS
INTRAVENOUS | Status: DC | PRN
  Administered 2019-08-25: 300 mg via INTRAVENOUS

## 2019-08-25 MED ORDER — SUCCINYLCHOLINE CHLORIDE 200 MG/10ML IV SOSY
PREFILLED_SYRINGE | INTRAVENOUS | Status: DC | PRN
  Administered 2019-08-25: 200 mg via INTRAVENOUS

## 2019-08-25 MED ORDER — CHLORHEXIDINE GLUCONATE CLOTH 2 % EX PADS: 6.0000 | MEDICATED_PAD | Freq: Every day | CUTANEOUS | Status: DC

## 2019-08-25 SURGICAL SUPPLY — 80 items
ADH SKN CLS APL DERMABOND .7 (GAUZE/BANDAGES/DRESSINGS) ×1
APL SWBSTK 6 STRL LF DISP (MISCELLANEOUS) ×1
APPLICATOR COTTON TIP 6 STRL (MISCELLANEOUS) IMPLANT
APPLICATOR COTTON TIP 6IN STRL (MISCELLANEOUS) ×2
APPLIER CLIP 5 13 M/L LIGAMAX5 (MISCELLANEOUS)
APPLIER CLIP ROT 10 11.4 M/L (STAPLE)
APPLIER CLIP ROT 13.4 12 LRG (CLIP)
APR CLP LRG 13.4X12 ROT 20 MLT (CLIP)
APR CLP MED LRG 11.4X10 (STAPLE)
APR CLP MED LRG 5 ANG JAW (MISCELLANEOUS)
BLADE SURG 15 STRL LF DISP TIS (BLADE) ×1 IMPLANT
BLADE SURG 15 STRL SS (BLADE) ×2
CABLE HIGH FREQUENCY MONO STRZ (ELECTRODE) IMPLANT
CATH ROBINSON RED A/P 14FR (CATHETERS) ×1 IMPLANT
CLIP APPLIE 5 13 M/L LIGAMAX5 (MISCELLANEOUS) IMPLANT
CLIP APPLIE ROT 10 11.4 M/L (STAPLE) IMPLANT
CLIP APPLIE ROT 13.4 12 LRG (CLIP) IMPLANT
COVER WAND RF STERILE (DRAPES) IMPLANT
DERMABOND ADVANCED (GAUZE/BANDAGES/DRESSINGS) ×1
DERMABOND ADVANCED .7 DNX12 (GAUZE/BANDAGES/DRESSINGS) IMPLANT
DEVICE SECURE STRAP 25 ABSORB (INSTRUMENTS) ×1 IMPLANT
DEVICE SUT QUICK LOAD TK 5 (STAPLE) IMPLANT
DEVICE SUT TI-KNOT TK 5X26 (MISCELLANEOUS) IMPLANT
DEVICE SUTURE ENDOST 10MM (ENDOMECHANICALS) IMPLANT
DISSECTOR BLUNT TIP ENDO 5MM (MISCELLANEOUS) IMPLANT
DRSG PAD ABDOMINAL 8X10 ST (GAUZE/BANDAGES/DRESSINGS) ×1 IMPLANT
ELECT L-HOOK LAP 45CM DISP (ELECTROSURGICAL) ×2
ELECT REM PT RETURN 15FT ADLT (MISCELLANEOUS) ×2 IMPLANT
ELECTRODE L-HOOK LAP 45CM DISP (ELECTROSURGICAL) IMPLANT
GAUZE SPONGE 4X4 12PLY STRL (GAUZE/BANDAGES/DRESSINGS) IMPLANT
GLOVE BIOGEL M 8.0 STRL (GLOVE) ×2 IMPLANT
GOWN STRL REUS W/TWL XL LVL3 (GOWN DISPOSABLE) ×8 IMPLANT
GRASPER SUT TROCAR 14GX15 (MISCELLANEOUS) ×2 IMPLANT
HANDLE STAPLE EGIA 4 XL (STAPLE) ×3 IMPLANT
HOVERMATT SINGLE USE (MISCELLANEOUS) ×2 IMPLANT
KIT BASIN OR (CUSTOM PROCEDURE TRAY) ×2 IMPLANT
KIT TURNOVER KIT A (KITS) IMPLANT
MARKER SKIN DUAL TIP RULER LAB (MISCELLANEOUS) IMPLANT
MESH VICRYL KNITTED 12X12 (Mesh General) ×1 IMPLANT
NDL SPNL 22GX3.5 QUINCKE BK (NEEDLE) ×1 IMPLANT
NEEDLE SPNL 22GX3.5 QUINCKE BK (NEEDLE) ×2 IMPLANT
PACK UNIVERSAL I (CUSTOM PROCEDURE TRAY) ×2 IMPLANT
PENCIL SMOKE EVACUATOR (MISCELLANEOUS) IMPLANT
PORT LAP GEL ALEXIS MED 5-9CM (MISCELLANEOUS) ×1 IMPLANT
RELOAD STAPLE 45 PURP MED/THCK (STAPLE) IMPLANT
RELOAD TRI 45 ART MED THCK BLK (STAPLE) ×2 IMPLANT
RELOAD TRI 45 ART MED THCK PUR (STAPLE) IMPLANT
RELOAD TRI 60 ART MED THCK BLK (STAPLE) ×5 IMPLANT
RELOAD TRI 60 ART MED THCK PUR (STAPLE) IMPLANT
SCISSORS LAP 5X45 EPIX DISP (ENDOMECHANICALS) IMPLANT
SET IRRIG TUBING LAPAROSCOPIC (IRRIGATION / IRRIGATOR) ×2 IMPLANT
SET TUBE SMOKE EVAC HIGH FLOW (TUBING) ×2 IMPLANT
SHEARS HARMONIC ACE PLUS 45CM (MISCELLANEOUS) ×2 IMPLANT
SLEEVE ADV FIXATION 5X100MM (TROCAR) ×5 IMPLANT
SLEEVE GASTRECTOMY 36FR VISIGI (MISCELLANEOUS) ×2 IMPLANT
SOL ANTI FOG 6CC (MISCELLANEOUS) ×1 IMPLANT
SOLUTION ANTI FOG 6CC (MISCELLANEOUS) ×1
SPONGE LAP 18X18 RF (DISPOSABLE) ×4 IMPLANT
STAPLER VISISTAT 35W (STAPLE) ×2 IMPLANT
SUT ETHILON 1 LR 30 (SUTURE) ×4 IMPLANT
SUT MNCRL AB 4-0 PS2 18 (SUTURE) ×4 IMPLANT
SUT NOVA 1 T20/GS 25DT (SUTURE) ×2 IMPLANT
SUT SURGIDAC NAB ES-9 0 48 120 (SUTURE) IMPLANT
SUT VICRYL 0 TIES 12 18 (SUTURE) ×2 IMPLANT
SYR 10ML ECCENTRIC (SYRINGE) ×2 IMPLANT
SYR 20ML LL LF (SYRINGE) ×2 IMPLANT
SYR 50ML LL SCALE MARK (SYRINGE) ×2 IMPLANT
SYR BULB IRRIGATION 50ML (SYRINGE) ×1 IMPLANT
TOWEL OR 17X26 10 PK STRL BLUE (TOWEL DISPOSABLE) ×4 IMPLANT
TOWEL OR NON WOVEN STRL DISP B (DISPOSABLE) ×2 IMPLANT
TRAY FOLEY MTR SLVR 16FR STAT (SET/KITS/TRAYS/PACK) ×1 IMPLANT
TROCAR ADV FIXATION 5X100MM (TROCAR) ×2 IMPLANT
TROCAR BLADELESS 15MM (ENDOMECHANICALS) ×2 IMPLANT
TROCAR BLADELESS OPT 5 100 (ENDOMECHANICALS) ×2 IMPLANT
TROCAR BLADELESS OPT 5 150 (ENDOMECHANICALS) ×2 IMPLANT
TUBE CALIBRATION LAPBAND (TUBING) IMPLANT
TUBING CONNECTING 10 (TUBING) ×2 IMPLANT
TUBING ENDO SMARTCAP (MISCELLANEOUS) ×2 IMPLANT
YANKAUER SUCT BULB TIP 10FT TU (MISCELLANEOUS) ×1 IMPLANT
YANKAUER SUCT BULB TIP NO VENT (SUCTIONS) ×1 IMPLANT

## 2019-08-25 NOTE — Progress Notes (Signed)
Started on water and ice chips.

## 2019-08-25 NOTE — Interval H&P Note (Signed)
History and Physical Interval Note:  08/25/2019 7:10 AM  Cameron Jones  has presented today for surgery, with the diagnosis of s/p Lap Band status, HTN, Lap Band Malfunction.  The various methods of treatment have been discussed with the patient and family. After consideration of risks, benefits and other options for treatment, the patient has consented to  Procedure(s): LAPAROSCOPIC GASTRIC BAND REMOVAL WITH LAPAROSCOPIC GASTRIC SLEEVE RESECTION, Upper Endo, ERAS Pathway (N/A) as a surgical intervention.  The patient's history has been reviewed, patient examined, no change in status, stable for surgery.  I have reviewed the patient's chart and labs.  Questions were answered to the patient's satisfaction.     Pedro Earls

## 2019-08-25 NOTE — Transfer of Care (Signed)
Immediate Anesthesia Transfer of Care Note  Patient: Cameron Jones  Procedure(s) Performed: LAPAROSCOPIC GASTRIC BAND REMOVAL WITH LAPAROSCOPIC GASTRIC SLEEVE RESECTION, Upper Endo, ERAS Pathway (N/A Abdomen) LAPAROSCOPIC LYSIS OF ADHESIONS (N/A Abdomen) HERNIA REPAIR INCARCERATED (N/A Abdomen)  Patient Location: PACU  Anesthesia Type:General  Level of Consciousness: drowsy and patient cooperative  Airway & Oxygen Therapy: Patient Spontanous Breathing and Patient connected to face mask oxygen  Post-op Assessment: Report given to RN and Post -op Vital signs reviewed and stable  Post vital signs: Reviewed and stable  Last Vitals:  Vitals Value Taken Time  BP    Temp    Pulse    Resp    SpO2      Last Pain:  Vitals:   08/25/19 0558  TempSrc:   PainSc: 0-No pain      Patients Stated Pain Goal: 3 ([id number])  Complications: No apparent anesthesia complications

## 2019-08-25 NOTE — Progress Notes (Signed)
Discussed post op day goals with patient including ambulation, IS, diet progression, pain, and nausea control.  BSTOP education provided including BSTOP information guide, "Guide for Pain Management after your Bariatric Procedure".  Questions answered. 

## 2019-08-25 NOTE — Progress Notes (Signed)
PHARMACY CONSULT FOR:  Risk Assessment for Post-Discharge VTE Following Bariatric Surgery  Post-Discharge VTE Risk Assessment: This patient's probability of 30-day post-discharge VTE is increased due to the factors marked: X  Male    Age >/=60 years  X  BMI >/=50 kg/m2    CHF    Dyspnea at Rest    Paraplegia  X  Non-gastric-band surgery  X  Operation Time >/=3 hr    Return to OR     Length of Stay >/= 3 d      Hx of VTE   Hypercoagulable condition   Significant venous stasis   Predicted probability of 30-day post-discharge VTE: 0.8  Other patient-specific factors to consider:  none  Recommendation for Discharge: Enoxaparin 60 mg Roff q12h x 2 weeks post-discharge  Cameron Jones is a 53 y.o. male who underwent Laparoscopic gastric band removal with laparoscopic gastric sleeve resection 08/25/2019   No Known Allergies  Patient Measurements: Height: 6' (182.9 cm) Weight: (!) 395 lb 6 oz (179.3 kg) IBW/kg (Calculated) : 77.6 Body mass index is 53.62 kg/m.  No results for input(s): WBC, HGB, HCT, PLT, APTT, CREATININE, LABCREA, CREATININE, CREAT24HRUR, MG, PHOS, ALBUMIN, PROT, ALBUMIN, AST, ALT, ALKPHOS, BILITOT, BILIDIR, IBILI in the last 72 hours. Estimated Creatinine Clearance: 176.2 mL/min (by C-G formula based on SCr of 0.83 mg/dL).    Past Medical History:  Diagnosis Date  . Arthritis    Knee  . Cancer ([place])    colon  . Carpal tunnel syndrome of right wrist    resolved   . Fatty liver    per Dr Radford Pax note  . GERD (gastroesophageal reflux disease)   . Hernia   . History of blood transfusion   . Hypertension   . Lower back pain   . Obesity   . Sleep apnea    severe per report 12/11  on chart  . Ventral hernia      Medications Prior to Admission  Medication Sig Dispense Refill Last Dose  . acetaminophen (TYLENOL) 500 MG tablet Take 500 mg by mouth every 6 (six) hours as needed for moderate pain or headache.   Past Month at Unknown time  . amLODipine  (NORVASC) 10 MG tablet Take 1 tablet (10 mg total) by mouth daily. (Patient taking differently: Take 10 mg by mouth daily at 12 noon. ) 90 tablet 3 08/25/2019 at 0420  . chlorthalidone (HYGROTON) 25 MG tablet TAKE 1 TABLET BY MOUTH EVERY DAY 90 tablet 1 08/19/2019 at Unknown time  . cholecalciferol (VITAMIN D3) 25 MCG (1000 UT) tablet Take 1,000 Units by mouth daily.   08/22/2019  . ferrous sulfate 325 (65 FE) MG tablet Take 325 mg by mouth daily with breakfast.   08/22/2019  . losartan (COZAAR) 25 MG tablet Take 1 tablet (25 mg total) by mouth daily. (Patient taking differently: Take 25 mg by mouth daily at 12 noon. ) 90 tablet 0 08/24/2019 at Unknown time  . Multiple Vitamins-Minerals (MULTIVITAMIN WITH MINERALS) tablet Take 1 tablet by mouth daily.    08/24/2019 at Unknown time  . omeprazole (PRILOSEC OTC) 20 MG tablet Take 20 mg by mouth daily.   08/25/2019 at [place], Pharm.D [phone] 08/25/2019 2:12 PM

## 2019-08-25 NOTE — Op Note (Signed)
Name:  [name] MRN: [id number] Date of Surgery: 08/25/2019  Preop Diagnosis:  Morbid Obesity  Postop Diagnosis:  Morbid Obesity (Weight - 179 kg, BMI - 53.6), S/P Gastric Sleeve resection  Procedure:  Upper endoscopy  (Intraoperative)  Surgeon:  Alphonsa Overall, M.D.  Anesthesia:  GET  Indications for procedure: Cameron Jones is a 53 y.o. male whose primary care physician is Tisovec, Fransico Him, MD and has completed a gastric sleeve resection today for weight loss by Dr. Hassell Done.  I am doing an intraoperative upper endoscopy to evaluate the gastric pouch after the sleeve gastrectomy.  Operative Note: The patient is under general anesthesia.  Dr. Hassell Done is laparoscoping the patient while I do an upper endoscopy to evaluate the stomach pouch.  With the patient intubated, I passed the Olympus upper endoscope without difficulty down the esophagus.  The esophagus was unremarkable.  The esophago-gastric junction was at 44 cm.    The mucosa of the stomach looked viable and the staple line was intact without bleeding.  I advanced the scope to the pylorus, but did not go through it.  While I insufflated the stomach pouch with air, Dr. Hassell Done  flooded the upper abdomen with saline to put the gastric pouch under saline.  There was no bubbling or evidence of a leak.  There was no evidence of narrowing of the pouch and the gastric sleeve looked tubular.  The scope was then withdrawn.  The esophagus was unremarkable and the patient tolerated the endoscopy without difficulty.  Alphonsa Overall, MD, [facility] Office phone:  [phone]

## 2019-08-25 NOTE — Anesthesia Postprocedure Evaluation (Signed)
Anesthesia Post Note  Patient: Cameron Jones  Procedure(s) Performed: LAPAROSCOPIC GASTRIC BAND REMOVAL WITH LAPAROSCOPIC GASTRIC SLEEVE RESECTION, Upper Endo, ERAS Pathway (N/A Abdomen) LAPAROSCOPIC LYSIS OF ADHESIONS (N/A Abdomen) HERNIA REPAIR INCARCERATED (N/A Abdomen)     Patient location during evaluation: PACU Anesthesia Type: General Level of consciousness: awake and alert Pain management: pain level controlled Vital Signs Assessment: post-procedure vital signs reviewed and stable Respiratory status: spontaneous breathing, nonlabored ventilation, respiratory function stable and patient connected to nasal cannula oxygen Cardiovascular status: blood pressure returned to baseline and stable Postop Assessment: no apparent nausea or vomiting Anesthetic complications: no    Last Vitals:  Vitals:   08/25/19 1712 08/25/19 1759  BP: (!) 158/82 (!) 151/55  Pulse: 91 87  Resp: 18 18  Temp: 36.9 C 36.8 C  SpO2: 97% 96%    Last Pain:  Vitals:   08/25/19 1759  TempSrc: Oral  PainSc:                  Barnet Glasgow

## 2019-08-25 NOTE — Anesthesia Procedure Notes (Signed)
Procedure Name: Intubation Date/Time: 08/25/2019 7:38 AM Performed by: Montel Clock, CRNA Pre-anesthesia Checklist: Patient identified, Emergency Drugs available, Suction available, Patient being monitored and Timeout performed Patient Re-evaluated:Patient Re-evaluated prior to induction Oxygen Delivery Method: Circle system utilized Preoxygenation: Pre-oxygenation with 100% oxygen Induction Type: IV induction and Rapid sequence Laryngoscope Size: Glidescope and 4 Grade View: Grade I Tube type: Oral Tube size: 7.5 mm Number of attempts: 1 Airway Equipment and Method: Stylet Placement Confirmation: ETT inserted through vocal cords under direct vision,  positive ETCO2 and breath sounds checked- equal and bilateral Secured at: 23 cm Tube secured with: Tape Dental Injury: Teeth and Oropharynx as per pre-operative assessment  Comments: Elective glidescope

## 2019-08-25 NOTE — Op Note (Signed)
@  date@  25 August 2019 Surgeon: Kaylyn Lim, MD, FACS  Asst:  Alphonsa Overall, MD, FACS and Romana Juniper, MD, FACS  Anes:  General endotracheal  Procedure: Laparoscopic takedown of incarcerated incisional hernia and enterolysis, Alexis port assist, removal of lapband, sleeve gastrectomy and upper endoscopy; repair of hernia with vicryl mesh with retention sutures  Diagnosis: Morbid obesity, failed lapband; ventral hernia  Complications: Many adhesions and large hernia in a patient with BMI greater than 50  EBL:   30 cc Operative time: 5 hours  Description of Procedure:  The patient was take to OR 4 and given general anesthesia.  The abdomen was prepped with Chloroprep and draped sterilely.  A timeout was performed.  Access to the abdomen was achieved with a 5 mm Optiview through the left upper quadrant with difficulty due to his obesity and adhesions.  Multiple ports were placed on either side of the abdomen.  These were placed sequentially as we surrounded this incarcerated hernia and were trying to create adequate domain to reduce the hernia.  This was a complex hernia with multiple open sacs containing incarcerated omentum and part of the falciform.  Bowel was tethered to this as well.  After approximately 2 hours of enteral lysis I wanted made a midline in place the [place] and took down the remaining portion of the adhesions which were to this large hernia sac.  This left a about a 5 finger fascial defect.  This was from a prior right colectomy done back in the early 2000's.  With the [facility] and in the adhesions taken down and no evidence of an enterotomy we elected to proceed with removal of the LAP-BAND.  The [place] retractor was inserted and sharp and harmonic scalpel dissection proceeded proximally to identify the band, unbuckle it, cut the tubing and pull it out.  When this was removed we felt there was no evidence that the band had eroded and then elected to proceed with  the sleeve gastrectomy.  We had taken down the plication and and I freed up the proximal stomach.  The 49 VISI G was inserted and down to the antrum and I had taken down the short gastrics to about 5 cm from the pylorus.  The sleeve gastrectomy was performed with multiple firings of the 6 cm Covidien black load with TRS.  When this was completed the gastric remnant was easily pulled out through the [place] port and Dr. Lucia Gaskins performed endoscopy to assess the sleeve which appeared to have a good cylindrical anatomy and no leaks were noted.  No bleeding was noted as well.  The port was removed and we checked laparoscopically for the remaining remnant of the tubing and no other portions of tubing were found.  The hernia was repaired freeing up the edges making a relaxing incision on the left side and then placing 2 retention sutures through the fascia and skin and then placing about a 4 x 6 piece of Vicryl mesh that had been folded over and and sewn to itself so that it assumed a rectangular geometry and attempted delay that between the retention sutures in the fascia and closure.  The fascia was then closed interrupted #1 Novafil's.  Wounds were irrigated and the incisions were closed with staples.  Abdominal binder was applied and the patient was taken recovery room in satisfactory condition. Matt B. Hassell Done, [place], [facility], [place]

## 2019-08-26 LAB — CBC WITH DIFFERENTIAL/PLATELET
Abs Immature Granulocytes: 0.05 10*3/uL (ref 0.00–0.07)
Basophils Absolute: 0 10*3/uL (ref 0.0–0.1)
Basophils Relative: 0 %
Eosinophils Absolute: 0 10*3/uL (ref 0.0–0.5)
Eosinophils Relative: 0 %
HCT: 45.4 % (ref 39.0–52.0)
Hemoglobin: 14.5 g/dL (ref 13.0–17.0)
Immature Granulocytes: 0 %
Lymphocytes Relative: 7 %
Lymphs Abs: 0.9 10*3/uL (ref 0.7–4.0)
MCH: 28.5 pg (ref 26.0–34.0)
MCHC: 31.9 g/dL (ref 30.0–36.0)
MCV: 89.4 fL (ref 80.0–100.0)
Monocytes Absolute: 2 10*3/uL — ABNORMAL HIGH (ref 0.1–1.0)
Monocytes Relative: 15 %
Neutro Abs: 10.3 10*3/uL — ABNORMAL HIGH (ref 1.7–7.7)
Neutrophils Relative %: 78 %
Platelets: 194 10*3/uL (ref 150–400)
RBC: 5.08 MIL/uL (ref 4.22–5.81)
RDW: 13.8 % (ref 11.5–15.5)
WBC: 13.2 10*3/uL — ABNORMAL HIGH (ref 4.0–10.5)
nRBC: 0 % (ref 0.0–0.2)

## 2019-08-26 NOTE — Progress Notes (Signed)
Nutrition Note  RD consulted for diet education for patient s/p bariatric surgery. At this time, Bariatric nurse coordinator providing education.   If nutrition issues arise, please consult RD.   Clayton Bibles, MS, RD, LDN Inpatient Clinical Dietitian Pager: [phone] After Hours Pager: [phone]

## 2019-08-26 NOTE — Progress Notes (Signed)
Patient alert and oriented, Post op day 1.  Provided support and encouragement.  Encouraged pulmonary toilet, ambulation and small sips of liquids.  All questions answered.  Will continue to monitor. 

## 2019-08-26 NOTE — Discharge Instructions (Signed)
Enoxaparin injection °What is this medicine? °ENOXAPARIN (ee nox a PA rin) is used after knee, hip, or abdominal surgeries to prevent blood clotting. It is also used to treat existing blood clots in the lungs or in the veins. °This medicine may be used for other purposes; ask your health care provider or pharmacist if you have questions. °COMMON BRAND NAME(S): Lovenox °What should I tell my health care provider before I take this medicine? °They need to know if you have any of these conditions: °· bleeding disorders, hemorrhage, or hemophilia °· infection of the heart or heart valves °· kidney or liver disease °· previous stroke °· prosthetic heart valve °· recent surgery or delivery of a baby °· ulcer in the stomach or intestine, diverticulitis, or other bowel disease °· an unusual or allergic reaction to enoxaparin, heparin, pork or pork products, other medicines, foods, dyes, or preservatives °· pregnant or trying to get pregnant °· breast-feeding °How should I use this medicine? °This medicine is for injection under the skin. It is usually given by a health-care professional. You or a family member may be trained on how to give the injections. If you are to give yourself injections, make sure you understand how to use the syringe, measure the dose if necessary, and give the injection. To avoid bruising, do not rub the site where this medicine has been injected. Do not take your medicine more often than directed. Do not stop taking except on the advice of your doctor or health care professional. °Make sure you receive a puncture-resistant container to dispose of the needles and syringes once you have finished with them. Do not reuse these items. Return the container to your doctor or health care professional for proper disposal. °Talk to your pediatrician regarding the use of this medicine in children. Special care may be needed. °Overdosage: If you think you have taken too much of this medicine contact a poison  control center or emergency room at once. °NOTE: This medicine is only for you. Do not share this medicine with others. °What if I miss a dose? °If you miss a dose, take it as soon as you can. If it is almost time for your next dose, take only that dose. Do not take double or extra doses. °What may interact with this medicine? °· aspirin and aspirin-like medicines °· certain medicines that treat or prevent blood clots °· dipyridamole °· NSAIDs, medicines for pain and inflammation, like ibuprofen or naproxen °This list may not describe all possible interactions. Give your health care provider a list of all the medicines, herbs, non-prescription drugs, or dietary supplements you use. Also tell them if you smoke, drink alcohol, or use illegal drugs. Some items may interact with your medicine. °What should I watch for while using this medicine? °Visit your healthcare professional for regular checks on your progress. You may need blood work done while you are taking this medicine. Your condition will be monitored carefully while you are receiving this medicine. It is important not to miss any appointments. °If you are going to need surgery or other procedure, tell your healthcare professional that you are using this medicine. °Using this medicine for a long time may weaken your bones and increase the risk of bone fractures. °Avoid sports and activities that might cause injury while you are using this medicine. Severe falls or injuries can cause unseen bleeding. Be careful when using sharp tools or knives. Consider using an electric razor. Take special care brushing or flossing your   teeth. Report any injuries, bruising, or red spots on the skin to your healthcare professional. Wear a medical ID bracelet or chain. Carry a card that describes your disease and details of your medicine and dosage times. What side effects may I notice from receiving this medicine? Side effects that you should report to your doctor or health  care professional as soon as possible:  allergic reactions like skin rash, itching or hives, swelling of the face, lips, or tongue  bone pain  signs and symptoms of bleeding such as bloody or black, tarry stools; red or dark-brown urine; spitting up blood or brown material that looks like coffee grounds; red spots on the skin; unusual bruising or bleeding from the eye, gums, or nose  signs and symptoms of a blood clot such as chest pain; shortness of breath; pain, swelling, or warmth in the leg  signs and symptoms of a stroke such as changes in vision; confusion; trouble speaking or understanding; severe headaches; sudden numbness or weakness of the face, arm or leg; trouble walking; dizziness; loss of coordination Side effects that usually do not require medical attention (report to your doctor or health care professional if they continue or are bothersome):  hair loss  pain, redness, or irritation at site where injected This list may not describe all possible side effects. Call your doctor for medical advice about side effects. You may report side effects to FDA at 1-800-FDA-1088. Where should I keep my medicine? Keep out of the reach of children. Store at room temperature between 15 and 30 degrees C (59 and 86 degrees F). Do not freeze. If your injections have been specially prepared, you may need to store them in the refrigerator. Ask your pharmacist. Throw away any unused medicine after the expiration date. NOTE: This sheet is a summary. It may not cover all possible information. If you have questions about this medicine, talk to your doctor, pharmacist, or health care provider.  2020 Elsevier/Gold Standard (2017-09-06 11:25:34)     GASTRIC BYPASS/SLEEVE  Home Care Instructions   These instructions are to help you care for yourself when you go home.  Call: If you have any problems.  Call [phone] and ask for the surgeon on call  If you need immediate help, come to the ER  at [facility].   Tell the ER staff that you are a new post-op gastric bypass or gastric sleeve patient   Signs and symptoms to report:  Severe vomiting or nausea o If you cannot keep down clear liquids for longer than 1 day, call your surgeon   Abdominal pain that does not get better after taking your pain medication  Fever over 100.4 F with chills  Heart beating over 100 beats a minute  Shortness of breath at rest  Chest pain   Redness, swelling, drainage, or foul odor at incision (surgical) sites   If your incisions open or pull apart  Swelling or pain in calf (lower leg)  Diarrhea (Loose bowel movements that happen often), frequent watery, uncontrolled bowel movements  Constipation, (no bowel movements for 3 days) if this happens: Pick one o Milk of Magnesia, 2 tablespoons by mouth, 3 times a day for 2 days if needed o Stop taking Milk of Magnesia once you have a bowel movement o Call your doctor if constipation continues Or o Miralax  (instead of Milk of Magnesia) following the label instructions o Stop taking Miralax once you have a bowel movement o Call your doctor if constipation  continues  Anything you think is not normal   Normal side effects after surgery:  Unable to sleep at night or unable to focus  Irritability or moody  Being tearful (crying) or depressed These are common complaints, possibly related to your anesthesia medications that put you to sleep, stress of surgery, and change in lifestyle.  This usually goes away a few weeks after surgery.  If these feelings continue, call your primary care doctor.   Wound Care: You may have surgical glue, steri-strips, or staples over your incisions after surgery  Surgical glue:  Looks like a clear film over your incisions and will wear off a little at a time  Steri-strips: Strips of tape over your incisions. You may notice a yellowish color on the skin under the steri-strips. This is used to make the    steri-strips stick better. Do not pull the steri-strips off - let them fall off  Staples: Staples may be removed before you leave the hospital o If you go home with staples, call [facility], [phone]) [phone] at for an appointment with your surgeons nurse to have staples removed 10 days after surgery.  Showering: You may shower two (2) days after your surgery unless your surgeon tells you differently o Wash gently around incisions with warm soapy water, rinse well, and gently pat dry  o No tub baths until staples are removed, steri-strips fall off or glue is gone.    Medications:  Medications should be liquid or crushed if larger than the size of a dime  Extended release pills (medication that release a little bit at a time through the day) should NOT be crushed or cut. (examples include XL, ER, DR, SR)  Depending on the size and number of medications you take, you may need to space (take a few throughout the day)/change the time you take your medications so that you do not over-fill your pouch (smaller stomach)  Make sure you follow-up with your primary care doctor to make medication changes needed during rapid weight loss and life-style changes  If you have diabetes, follow up with the doctor that orders your diabetes medication(s) within one week after surgery and check your blood sugar regularly.  Do not drive while taking prescription pain medication   It is ok to take Tylenol by the bottle instructions with your pain medicine or instead of your pain medicine as needed.  DO NOT TAKE NSAIDS (EXAMPLES OF NSAIDS:  IBUPROFREN/ NAPROXEN)  Diet:                    First 2 Weeks  You will see the dietician t about two (2) weeks after your surgery. The dietician will increase the types of foods you can eat if you are handling liquids well:  If you have severe vomiting or nausea and cannot keep down clear liquids lasting longer than 1 day, call your surgeon @ [phone]) Protein  Shake  Drink at least 2 ounces of shake 5-6 times per day  Each serving of protein shakes (usually 8 - 12 ounces) should have: o 15 grams of protein  o And no more than 5 grams of carbohydrate   Goal for protein each day: o Men = 80 grams per day o Women = 60 grams per day  Protein powder may be added to fluids such as non-fat milk or Lactaid milk or unsweetened Soy/Almond milk (limit to 35 grams added protein powder per serving)  Hydration  Slowly increase the  amount of water and other clear liquids as tolerated (See Acceptable Fluids)  Slowly increase the amount of protein shake as tolerated    Sip fluids slowly and throughout the day.  Do not use straws.  May use sugar substitutes in small amounts (no more than 6 - 8 packets per day; i.e. Splenda)  Fluid Goal  The first goal is to drink at least 8 ounces of protein shake/drink per day (or as directed by the nutritionist); some examples of protein shakes are [employer] Johnson, [employer]R [employer], EAS Edge HP, and Unjury. See handout from pre-op Bariatric Education Class: o Slowly increase the amount of protein shake you drink as tolerated o You may find it easier to slowly sip shakes throughout the day o It is important to get your proteins in first  Your fluid goal is to drink 64 - 100 ounces of fluid daily o It may take a few weeks to build up to this  32 oz (or more) should be clear liquids  And   32 oz (or more) should be full liquids (see below for examples)  Liquids should not contain sugar, caffeine, or carbonation  Clear Liquids:  Water or Sugar-free flavored water (i.e. Fruit H2O, Propel)  Decaffeinated coffee or tea (sugar-free)  Crystal Lite, Wylers Lite, Minute Maid Lite  Sugar-free Jell-O  Bouillon or broth  Sugar-free Popsicle:   *Less than 20 calories each; Limit 1 per day  Full Liquids: Protein Shakes/Drinks + 2 choices per day of other full liquids  Full liquids must be: o No More Than 15  grams of Carbs per serving  o No More Than 3 grams of Fat per serving  Strained low-fat cream soup (except Cream of Potato or Tomato)  Non-Fat milk  Fat-free Lactaid Milk  Unsweetened Soy Or Unsweetened Almond Milk  Low Sugar yogurt (Dannon Lite & Fit, Greek yogurt; Oikos Triple Zero; Chobani Simply 100; Yoplait 100 calorie [place] - No Fruit on the Bottom)    Vitamins and Minerals  Start 1 day after surgery unless otherwise directed by your surgeon  2 Chewable Bariatric Specific Multivitamin / Multimineral Supplement with iron (Example: Bariatric Advantage Multi EA)  Chewable Calcium with Vitamin D-3 (Example: 3 Chewable Calcium Plus 600 with Vitamin D-3) o Take 500 mg three (3) times a day for a total of 1500 mg each day o Do not take all 3 doses of calcium at one time as it may cause constipation, and you can only absorb 500 mg  at a time  o Do not mix multivitamins containing iron with calcium supplements; take 2 hours apart  Menstruating women and those with a history of anemia (a blood disease that causes weakness) may need extra iron o Talk with your doctor to see if you need more iron  Do not stop taking or change any vitamins or minerals until you talk to your dietitian or surgeon  Your Dietitian and/or surgeon must approve all vitamin and mineral supplements   Activity and Exercise: Limit your physical activity as instructed by your doctor.  It is important to continue walking at home.  During this time, use these guidelines:  Do not lift anything greater than ten (10) pounds for at least two (2) weeks  Do not go back to work or drive until Engineer, production says you can  You may have sex when you feel comfortable  o It is VERY important for male patients to use a reliable birth control method; fertility often increases after  surgery  o All hormonal birth control will be ineffective for 30 days after surgery due to medications given during surgery a barrier method must be  used. o Do not get pregnant for at least 18 months  Start exercising as soon as your doctor tells you that you can o Make sure your doctor approves any physical activity  Start with a simple walking program  Walk 5-15 minutes each day, 7 days per week.   Slowly increase until you are walking 30-45 minutes per day Consider joining our [place] program. [phone] or email [email or web address]   Special Instructions Things to remember:  Use your CPAP when sleeping if this applies to you   [facility] has two free Bariatric Surgery Support Groups that meet monthly o The 3rd Thursday of each month, 6 pm, [facility]  o The 2nd Friday of each month, 11:45 am in the private dining room in the basement of [place]  It is very important to keep all follow up appointments with your surgeon, dietitian, primary care physician, and behavioral health practitioner  Routine follow up schedule with your surgeon include appointments at 2-3 weeks, 6-8 weeks, 6 months, and 1 year at a minimum.  Your surgeon may request to see you more often.   o After the first year, please follow up with your bariatric surgeon and dietitian at least once a year in order to maintain best weight loss results [facility]: [place]: [phone] Bariatric Nurse Coordinator: [phone]      Reviewed and Endorsed  by [facility] Patient Education Committee, June, 2016 Edits Approved: Aug, 2018

## 2019-08-26 NOTE — Progress Notes (Signed)
Patient slow to drink protein.  Switched to chicken soup protein,  Tolerating better than Ensure products.

## 2019-08-26 NOTE — Progress Notes (Signed)
Patient has finished his water and is working on his 3rd cup of protein. Patient does not like the protein drinks provided from the floor and has requested to drink Unjury instead. Patient has ambulated around the hall 5 times today and has demonstrated use of the IS. No needs expressed at this time.

## 2019-08-26 NOTE — Progress Notes (Signed)
Patient ID: Cameron Jones, male   DOB: Jan 29, 1966, 53 y.o.   MRN: [id number] [facility] Progress Note:   1 Day Post-Op  Subjective: Mental status is clear;   Objective: Vital signs in last 24 hours: Temp:  [97.7 F (36.5 C)-100 F (37.8 C)] 98.5 F (36.9 C) (12/01 1338) Pulse Rate:  [82-100] 100 (12/01 1338) Resp:  [18-20] 18 (12/01 1338) BP: (123-157)/(55-84) 146/84 (12/01 1338) SpO2:  [96 %-97 %] 96 % (12/01 1338)  Intake/Output from previous day: 11/30 0701 - 12/01 0700 In: 5276 [P.O.:180; I.V.:4996; IV Piggyback:100] Out: 2125 [Urine:2100; Blood:25] Intake/Output this shift: Total I/O In: 60 [P.O.:60] Out: 950 [Urine:950]  Physical Exam: Work of breathing is normal.  Sore in midline  Lab Results:  Results for orders placed or performed during the hospital encounter of 08/25/19 (from the past 48 hour(s))  Type and screen [place]     Status: None   Collection Time: 08/25/19  6:14 AM  Result Value Ref Range   ABO/RH(D) O POS    Antibody Screen NEG    Sample Expiration      08/28/2019,2359 Performed at [facility], [place] [address] [place] [address]   Hemoglobin and hematocrit, blood     Status: None   Collection Time: 08/25/19  5:33 PM  Result Value Ref Range   Hemoglobin 15.3 13.0 - 17.0 g/dL   HCT 47.3 39.0 - 52.0 %    Comment: Performed at [facility], [place] [address], [place], [place] [address]  CBC WITH DIFFERENTIAL     Status: Abnormal   Collection Time: 08/26/19  3:24 AM  Result Value Ref Range   WBC 13.2 (H) 4.0 - 10.5 K/uL   RBC 5.08 4.22 - 5.81 MIL/uL   Hemoglobin 14.5 13.0 - 17.0 g/dL   HCT 45.4 39.0 - 52.0 %   MCV 89.4 80.0 - 100.0 fL   MCH 28.5 26.0 - 34.0 pg   MCHC 31.9 30.0 - 36.0 g/dL   RDW 13.8 11.5 - 15.5 %   Platelets 194 150 - 400 K/uL   nRBC 0.0 0.0 - 0.2 %   Neutrophils Relative % 78 %   Neutro Abs 10.3 (H) 1.7 - 7.7 K/uL   Lymphocytes Relative 7 %   Lymphs Abs 0.9 0.7 - 4.0 K/uL   Monocytes Relative 15 %   Monocytes Absolute 2.0 (H) 0.1 - 1.0 K/uL   Eosinophils Relative 0 %   Eosinophils Absolute 0.0 0.0 - 0.5 K/uL   Basophils Relative 0 %   Basophils Absolute 0.0 0.0 - 0.1 K/uL   Immature Granulocytes 0 %   Abs Immature Granulocytes 0.05 0.00 - 0.07 K/uL    Comment: Performed at [facility], [place] [address] [place],  [address]    Radiology/Results: No results found.  Anti-infectives: Anti-infectives (From admission, onward)   Start     Dose/Rate Route Frequency Ordered Stop   08/25/19 0600  cefoTEtan (CEFOTAN) 2 g in sodium chloride 0.9 % 100 mL IVPB     2 g 200 mL/hr over 30 Minutes Intravenous On call to O.R. 08/25/19 0528 08/25/19 0750      Assessment/Plan: Problem List: Patient Active Problem List   Diagnosis Date Noted  . 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 Essential hypertension 11/23/2018  . OSA (obstructive sleep apnea) 07/26/2017  . Partial small bowel obstruction ([place]) 12/27/2016  . 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 Colon cancer-right T3 N0,  Oct 2007 10/06/2011  . Ventral hernia-midline 10/06/2011  . Obstructive sleep apnea of adult 10/06/2011  . Morbid obesity ([place]) 10/06/2011    Post 5 hour case;  Needs to work on atelectasis and po fluids.  Hopeful discharge tomorrow.   1 Day Post-Op    LOS: 1 day   Matt B. Hassell Done, MD, [facility] [phone] beeper [phone]  08/26/2019 5:22 PM

## 2019-08-26 NOTE — TOC Benefit Eligibility Note (Signed)
Transition of Care [facility]) Benefit Eligibility Note    Patient Details  Name: Garan Cohen MRN: [id number] Date of Birth: [birth year]   Medication/Dose: Enoxaparin 60mg  Lovenox requires prior auth  Covered?: Yes(Enoxaparin on formulary as prefered drug Lovenox requires prior auth [id number])  Tier: 3 Drug  Prescription Coverage Preferred Pharmacy: local pharmacy  Spoke with Person/Company/Phone Number:: Josiah/ [employer] [phone]  Co-Pay: $124.46  Prior Approval: No          Kerin Salen Phone Number: 08/26/2019, 10:50 AM

## 2019-08-27 ENCOUNTER — Ambulatory Visit: Payer: BC Managed Care – PPO | Admitting: Professional

## 2019-08-27 LAB — CBC WITH DIFFERENTIAL/PLATELET
Abs Immature Granulocytes: 0.03 10*3/uL (ref 0.00–0.07)
Basophils Absolute: 0 10*3/uL (ref 0.0–0.1)
Basophils Relative: 0 %
Eosinophils Absolute: 0 10*3/uL (ref 0.0–0.5)
Eosinophils Relative: 0 %
HCT: 45.9 % (ref 39.0–52.0)
Hemoglobin: 14.8 g/dL (ref 13.0–17.0)
Immature Granulocytes: 0 %
Lymphocytes Relative: 12 %
Lymphs Abs: 1.3 10*3/uL (ref 0.7–4.0)
MCH: 28.9 pg (ref 26.0–34.0)
MCHC: 32.2 g/dL (ref 30.0–36.0)
MCV: 89.6 fL (ref 80.0–100.0)
Monocytes Absolute: 1.2 10*3/uL — ABNORMAL HIGH (ref 0.1–1.0)
Monocytes Relative: 11 %
Neutro Abs: 7.6 10*3/uL (ref 1.7–7.7)
Neutrophils Relative %: 77 %
Platelets: 188 10*3/uL (ref 150–400)
RBC: 5.12 MIL/uL (ref 4.22–5.81)
RDW: 14 % (ref 11.5–15.5)
WBC: 10.1 10*3/uL (ref 4.0–10.5)
nRBC: 0 % (ref 0.0–0.2)

## 2019-08-27 MED ORDER — OXYCODONE HCL 5 MG PO TABS: 5.0000 mg | ORAL_TABLET | Freq: Four times a day (QID) | ORAL | 0 refills | Status: DC | PRN

## 2019-08-27 MED ORDER — ENOXAPARIN SODIUM 60 MG/0.6ML ~~LOC~~ SOLN: 60.0000 mg | Freq: Two times a day (BID) | SUBCUTANEOUS | 0 refills | Status: DC

## 2019-08-27 MED ORDER — PANTOPRAZOLE SODIUM 40 MG PO TBEC: 40.0000 mg | DELAYED_RELEASE_TABLET | Freq: Every day | ORAL | 0 refills | Status: DC

## 2019-08-27 MED ORDER — ONDANSETRON 4 MG PO TBDP: 4.0000 mg | ORAL_TABLET | Freq: Four times a day (QID) | ORAL | 0 refills | Status: DC | PRN

## 2019-08-27 NOTE — Discharge Summary (Signed)
Physician Discharge Summary  Patient ID: Cameron Jones MRN: [id number] DOB/AGE: 11-23-1965 53 y.o.  PCP: Haywood Pao, MD  Admit date: 08/25/2019 Discharge date: 08/27/2019  Admission Diagnoses:  Failed lapband and morbid obesity  Discharge Diagnoses:  Same with ventral hernia and extensive adhesions  Active Problems:   S/P laparoscopic sleeve gastrectomy   Surgery:  Laparoscopy, enterolysis, removal of lapband, sleeve gastrectomy, endoscopy, repair of ventral hernia with vicryl mesh and retention sutures  Discharged Condition: improved  Hospital Course:   Had surgery on Monday.  Got along well.  Surgery time was 5 hours and slower than usual recovery.  Ready for discharge on Wed  Consults: Pharmacy  Significant Diagnostic Studies: none    Discharge Exam: Blood pressure (!) 152/67, pulse 99, temperature 99.3 F (37.4 C), temperature source Oral, resp. rate 20, height 6' (1.829 m), weight (!) 179.3 kg, SpO2 99 %. Incisions covered.  Will remove staples in 10 days.  Will go home on 2 weeks of Lovenox 60 BID  Disposition: Discharge disposition: 01-Home or Self Care       Discharge Instructions    Ambulate hourly while awake   Complete by: As directed    Call MD for:  difficulty breathing, headache or visual disturbances   Complete by: As directed    Call MD for:  persistant dizziness or light-headedness   Complete by: As directed    Call MD for:  persistant nausea and vomiting   Complete by: As directed    Call MD for:  redness, tenderness, or signs of infection (pain, swelling, redness, odor or green/yellow discharge around incision site)   Complete by: As directed    Call MD for:  severe uncontrolled pain   Complete by: As directed    Call MD for:  temperature >101 F   Complete by: As directed    Diet bariatric full liquid   Complete by: As directed    Discharge wound care:   Complete by: As directed    Apply dressing over midline incision that may  drain periodically.  Staple removal in 10 days.   Incentive spirometry   Complete by: As directed    Perform hourly while awake     Allergies as of 08/27/2019   No Known Allergies     Medication List    STOP taking these medications   omeprazole 20 MG tablet Commonly known as: PRILOSEC OTC     TAKE these medications   acetaminophen 500 MG tablet Commonly known as: TYLENOL Take 500 mg by mouth every 6 (six) hours as needed for moderate pain or headache.   amLODipine 10 MG tablet Commonly known as: NORVASC Take 1 tablet (10 mg total) by mouth daily. What changed: when to take this   chlorthalidone 25 MG tablet Commonly known as: HYGROTON TAKE 1 TABLET BY MOUTH EVERY DAY   cholecalciferol 25 MCG (1000 UT) tablet Commonly known as: VITAMIN D3 Take 1,000 Units by mouth daily.   enoxaparin 60 MG/0.6ML injection Commonly known as: LOVENOX Inject 0.6 mLs (60 mg total) into the skin every 12 (twelve) hours for 14 days.   ferrous sulfate 325 (65 FE) MG tablet Take 325 mg by mouth daily with breakfast.   losartan 25 MG tablet Commonly known as: COZAAR Take 1 tablet (25 mg total) by mouth daily. What changed: when to take this   multivitamin with minerals tablet Take 1 tablet by mouth daily.   ondansetron 4 MG disintegrating tablet Commonly known as: ZOFRAN-ODT Take  1 tablet (4 mg total) by mouth every 6 (six) hours as needed for nausea or vomiting.   oxyCODONE 5 MG immediate release tablet Commonly known as: Oxy IR/ROXICODONE Take 1 tablet (5 mg total) by mouth every 6 (six) hours as needed for severe pain.   pantoprazole 40 MG tablet Commonly known as: PROTONIX Take 1 tablet (40 mg total) by mouth daily.            Discharge Care Instructions  (From admission, onward)         Start     Ordered   08/27/19 0000  Discharge wound care:    Comments: Apply dressing over midline incision that may drain periodically.  Staple removal in 10 days.   08/27/19 0824          Follow-up Information    Surgery, [place]. Go on 09/10/2019.   Specialty: General Surgery Why: at 11 am Contact information: [address] [place] [address] [phone]        Carlena Hurl, PA-C. Go on 10/07/2019.   Specialty: General Surgery Why: at 4 pm Contact information: [place] [address] [phone]           Signed: Pedro Earls 08/27/2019, 8:25 AM

## 2019-08-27 NOTE — Progress Notes (Signed)
Discharge instructions given to patient by Bariatric nurse. Patient had no questions. Writer educated wife on how to give heparin shot.

## 2019-08-27 NOTE — Progress Notes (Signed)
Patient alert and oriented, pain is controlled. Patient is tolerating fluids, advanced to protein shake today, patient is tolerating well.  Reviewed Gastric sleeve discharge instructions with patient and patient is able to articulate understanding.  Provided information on BELT program, Support Group and [facility] outpatient pharmacy. All questions answered, will continue to monitor.  Total fluid intake 500 Call back 08/30/2019 per dehydration protocol

## 2019-08-27 NOTE — Progress Notes (Signed)
Patient has finished 8 oz of protein.

## 2019-08-29 ENCOUNTER — Telehealth (HOSPITAL_COMMUNITY): Payer: Self-pay

## 2019-08-29 NOTE — Telephone Encounter (Signed)
Patient called to discuss post bariatric surgery follow up questions.  See below:   1.  Tell me about your pain and pain management? Gas pain most uncomfortable  2.  Let's talk about fluid intake.  How much total fluid are you taking in?70 ounces of fluid  3.  How much protein have you taken in the last 2 days? 90 grams  4.  Have you had nausea?  Tell me about when have experienced nausea and what you did to help? Took nausea medication last night  5.  Has the frequency or color changed with your urine?dark but getting lighter  6.  Tell me what your incisions look like?no problems  7.  Have you been passing gas? BM?had 3 bm's  8.  If a problem or question were to arise who would you call?  Do you know contact numbers for [facility], [facility], and [facility]?aware of how to contact services  9.  How has the walking going?walking around  10.  How are your vitamins and calcium going?  How are you taking them?no questions taking medication

## 2019-09-01 ENCOUNTER — Encounter (HOSPITAL_COMMUNITY): Payer: Self-pay | Admitting: Surgery

## 2019-09-02 LAB — SURGICAL PATHOLOGY

## 2019-09-09 ENCOUNTER — Encounter: Payer: BC Managed Care – PPO | Attending: Surgery | Admitting: Skilled Nursing Facility1

## 2019-09-09 ENCOUNTER — Encounter: Payer: Self-pay | Admitting: Skilled Nursing Facility1

## 2019-09-09 ENCOUNTER — Other Ambulatory Visit: Payer: Self-pay

## 2019-09-09 DIAGNOSIS — E669 Obesity, unspecified: Secondary | ICD-10-CM | POA: Insufficient documentation

## 2019-09-09 NOTE — Progress Notes (Signed)
2 Week Post-Operative Nutrition Class   Patient was seen on 11/19/18 for Post-Operative Nutrition education at the [facility].     Surgery date: 08/25/2019 Surgery type: Gastric Band removal with Sleeve [facility] weight at [facility]: 421.6 lbs Weight today: 384.1 lbs   Body Composition Scale Date 09/09/19  Total Body Fat % 42.0  Visceral Fat 42  Fat-Free Mass % 57.9   Total Body Water % 38.9   Muscle-Mass lbs 72.5  Body Fat Displacement          Torso  lbs 100.4         Left Leg  lbs 20.0         Right Leg  lbs 20.0         Left Arm  lbs 10.0         Right Arm   lbs 10.0     The following the learning objectives were met by the patient during this course:  Identifies Phase 3 (Soft, High Proteins) Dietary Goals and will begin from 2 weeks post-operatively to 2 months post-operatively  Identifies appropriate sources of fluids and proteins   States protein recommendations and appropriate sources post-operatively  Identifies the need for appropriate texture modifications, mastication, and bite sizes when consuming solids  Identifies appropriate multivitamin and calcium sources post-operatively  Describes the need for physical activity post-operatively and will follow MD recommendations  States when to call healthcare provider regarding medication questions or post-operative complications   Handouts given during class include:  Phase 3A: Soft, High Protein Diet Handout   Follow-Up Plan: Patient will follow-up at [facility] in 6 weeks for 2 month post-op nutrition visit for diet advancement per MD.

## 2019-09-10 ENCOUNTER — Ambulatory Visit (INDEPENDENT_AMBULATORY_CARE_PROVIDER_SITE_OTHER): Payer: BC Managed Care – PPO | Admitting: Professional

## 2019-09-10 DIAGNOSIS — F411 Generalized anxiety disorder: Secondary | ICD-10-CM | POA: Diagnosis not present

## 2019-09-15 ENCOUNTER — Telehealth: Payer: Self-pay | Admitting: Skilled Nursing Facility1

## 2019-09-15 NOTE — Telephone Encounter (Signed)
RD called pt to verify fluid intake once starting soft, solid proteins 2 week post-bariatric surgery.   Daily Fluid intake: close to 64 oz Daily Protein intake: 60g a day  Concerns/issues: Pt reports it is going ok, having a tough time meeting his protein goals, and fluid intake. Pt reports food feels stuck when he eats. Pt reports constipation over the last couple of days Pt reports 1 shake, and a protein water yesterday. Pt had beans and chicken as well.

## 2019-09-25 ENCOUNTER — Ambulatory Visit (INDEPENDENT_AMBULATORY_CARE_PROVIDER_SITE_OTHER): Payer: BC Managed Care – PPO | Admitting: Professional

## 2019-09-25 DIAGNOSIS — F411 Generalized anxiety disorder: Secondary | ICD-10-CM

## 2019-10-08 ENCOUNTER — Ambulatory Visit (INDEPENDENT_AMBULATORY_CARE_PROVIDER_SITE_OTHER): Payer: BC Managed Care – PPO | Admitting: Professional

## 2019-10-08 DIAGNOSIS — F411 Generalized anxiety disorder: Secondary | ICD-10-CM | POA: Diagnosis not present

## 2019-10-21 ENCOUNTER — Other Ambulatory Visit: Payer: Self-pay

## 2019-10-21 ENCOUNTER — Encounter: Payer: BC Managed Care – PPO | Attending: Surgery | Admitting: Skilled Nursing Facility1

## 2019-10-21 DIAGNOSIS — E669 Obesity, unspecified: Secondary | ICD-10-CM | POA: Diagnosis not present

## 2019-10-21 NOTE — Progress Notes (Signed)
Bariatric Nutrition Follow-Up Visit Medical Nutrition Therapy    Post-Operative sleeve from band Surgery Surgery Date: 08/25/2019  NUTRITION ASSESSMENT    Anthropometrics  Start weight at [facility]: 421.6 lbs (date: 08/30/2018) Today's weight: 353.5 lbs  Body Composition Scale Date  Weight  lbs 353.5  Total Body Fat  % 40.4     Visceral Fat   Fat-Free Mass  %      Total Body Water  % 40.5     Muscle-Mass  lbs   BMI   Body Fat Displacement ---        Torso  lbs         Left Leg  lbs         Right Leg  lbs         Left Arm  lbs         Right Arm  lbs    Clinical  Medical hx: colon cancer, HTN, OSA Medications:  Labs:    Lifestyle & Dietary Hx  Pt states he has gotten to his protein goal and fluid has gotten easier but still not to goal.  Pt is struggling to meet his needs due to getting caught up in work.  Pt states he has been having hip pain. Pt states he will try some walking in place videos.  Pt states he does not want to drink crystal light or unsweet tea because it is a slippery slope.  Pt states he was having reflux realizing it was because he was eating too late and states he cannot lay on his left side. .  Pt states his surgery was rough due to previous colon cancer adhesions and hernias.  Pt states he has been needing to strain with his bowel movements.   Estimated daily fluid intake: 45-50 oz Estimated daily protein intake: 90+ g Supplements: bari adv multi and calcium  Current average weekly physical activity: walking 4 days a  Week 3 miles  24-Hr Dietary Recall: 1 protein water a day First Meal: tuna and egg or [place] bacon and egg or skipped Snack: greek yogurt Second Meal: cottage cheese and tuna Snack: sugar free popcicle or sugar free jello Third Meal: salmon and tuna or chicken Snack:  Beverages: protein water, water, sometimes lemon in water, fruit 20 water  Post-Op Goals/ Signs/ Symptoms Using straws: no Drinking while eating:  no Chewing/swallowing difficulties: no Changes in vision: no Changes to mood/headaches: no Hair loss/changes to skin/nails: no Difficulty focusing/concentrating: no Sweating: no Dizziness/lightheadedness: no Palpitations: no  Carbonated/caffeinated beverages: no N/V/D/C/Gas: constipation; only taking miralax 3 times a week Abdominal pain: no Dumping syndrome: no    NUTRITION DIAGNOSIS  Overweight/obesity ([place]-3.3) related to past poor dietary habits and physical inactivity as evidenced by completed bariatric surgery and following dietary guidelines for continued weight loss and healthy nutrition status.     NUTRITION INTERVENTION Nutrition counseling (C-1) and education (E-2) to facilitate bariatric surgery goals, including: . Diet advancement to the next phase now including non starchy vegetables  . The importance of consuming adequate calories as well as certain nutrients daily due to the body's need for essential vitamins, minerals, and fats . The importance of daily physical activity and to reach a goal of at least 150 minutes of moderate to vigorous physical activity weekly (or as directed by their physician) due to benefits such as increased musculature and improved lab values  Goals: -Continue to aim for a minimum of 64 fluid ounces 7 days a week with at least  30 ounces being plain water  -Eat non-starchy vegetables 2 times a day 7 days a week  -Start out with soft cooked vegetables for 3 days; if tolerated begin to eat raw vegetables or cooked including salads  -Eat your 3 ounces of protein first then start in on your non-starchy vegetables; once you understand how much of your meal leads to satisfaction and not full while still eating 3 ounces of protein and non-starchy vegetables you can eat them in any order   -Continue to aim for 30 minutes of activity at least 5 times a week  -Do NOT cook with/add to your food: alfredo sauce, cheese sauce, barbeque sauce, ketchup, fat  back, butter, bacon grease, grease, Crisco    Handouts Provided Include   Non starchy vegetables   Learning Style & Readiness for Change Teaching method utilized: Visual & Auditory  Demonstrated degree of understanding via: Teach Back  Barriers to learning/adherence to lifestyle change: learning curve   RD's Notes for Next Visit . Assess adherence to pt chosen goals . Assess pts readiness for change    MONITORING & EVALUATION Dietary intake, weekly physical activity, body weight   Next Steps Patient is to follow-up in 2 months

## 2019-10-22 ENCOUNTER — Ambulatory Visit (INDEPENDENT_AMBULATORY_CARE_PROVIDER_SITE_OTHER): Payer: BC Managed Care – PPO | Admitting: Professional

## 2019-10-22 DIAGNOSIS — F411 Generalized anxiety disorder: Secondary | ICD-10-CM | POA: Diagnosis not present

## 2019-11-05 ENCOUNTER — Ambulatory Visit (INDEPENDENT_AMBULATORY_CARE_PROVIDER_SITE_OTHER): Payer: BC Managed Care – PPO | Admitting: Professional

## 2019-11-05 DIAGNOSIS — F411 Generalized anxiety disorder: Secondary | ICD-10-CM | POA: Diagnosis not present

## 2019-11-19 ENCOUNTER — Ambulatory Visit: Payer: BC Managed Care – PPO | Admitting: Professional

## 2019-11-21 ENCOUNTER — Ambulatory Visit: Payer: BC Managed Care – PPO | Admitting: Cardiology

## 2019-12-03 ENCOUNTER — Ambulatory Visit (INDEPENDENT_AMBULATORY_CARE_PROVIDER_SITE_OTHER): Payer: BC Managed Care – PPO | Admitting: Professional

## 2019-12-03 DIAGNOSIS — F411 Generalized anxiety disorder: Secondary | ICD-10-CM | POA: Diagnosis not present

## 2019-12-16 ENCOUNTER — Other Ambulatory Visit: Payer: Self-pay

## 2019-12-16 ENCOUNTER — Encounter: Payer: BC Managed Care – PPO | Attending: Surgery | Admitting: Skilled Nursing Facility1

## 2019-12-16 DIAGNOSIS — E669 Obesity, unspecified: Secondary | ICD-10-CM | POA: Insufficient documentation

## 2019-12-16 NOTE — Progress Notes (Signed)
Bariatric Nutrition Follow-Up Visit Medical Nutrition Therapy    Post-Operative sleeve from band Surgery Surgery Date: 08/25/2019  NUTRITION ASSESSMENT    Anthropometrics  Start weight at [facility]: 421.6 lbs (date: 08/30/2018) Today's weight: 328 lbs  Body Composition Scale Date 12/16/2019  Weight  lbs 353.5 328  Total Body Fat  % 40.4 38.1     Visceral Fat  33  Fat-Free Mass  %  61.8     Total Body Water  % 40.5 42.8     Muscle-Mass  lbs  61.9  BMI  44.6  Body Fat Displacement ---         Torso  lbs  77.7        Left Leg  lbs  15.5        Right Leg  lbs  15.5        Left Arm  lbs  7.7        Right Arm  lbs  7.7   Clinical  Medical hx: colon cancer, HTN, OSA Medications:  Labs:    Lifestyle & Dietary Hx  Pt states last week his blood pressure was all over the place. Pt states he has been eating a lot of white sauce lately since cutting those out have been feeling better. Pt states his son is a [employer] scientist and advised him to drink more water. Pt states he walks faster than he was before increasing his intensity. Pt states he has been taking a different multivitamin but does not remember the name of it.   Pt seems to not be confident with his foods and pt does not seem comfortable with foods due to fear of weight gain.  Pt states he could add in potatoes in the morning because he can burn it off throughout the day.  Dietitian advsied pt the weight loss will slow down to mitigate the pts ftutue worries about wight gain.   Estimated daily fluid intake: 70-74 oz Estimated daily protein intake: 90+ g Supplements: multi and calcium  Current average weekly physical activity: walking 5-6 days a week 3 miles  24-Hr Dietary Recall: 1 protein water a day; 15-20 grams protein per meal; adding mustard or mayo to foods First Meal: tuna with peppers and spinach and 1 egg or 2 [place] suasage and egg or skipped Snack: greek yogurt or sugar free pudding Second Meal: cottage cheese and tuna or  chicken or salmon with peppers and spinach and onions Snack: sugar free popcicle or sugar free jello Third Meal: salmon or tuna or chicken or shrimp Snack: peanut butter with celery  Beverages: fruit 2o, water, hot water in the morning   Post-Op Goals/ Signs/ Symptoms Using straws: no Drinking while eating: no Chewing/swallowing difficulties: no Changes in vision: no Changes to mood/headaches: no Hair loss/changes to skin/nails: no Difficulty focusing/concentrating: no Sweating: no Dizziness/lightheadedness: no Palpitations: no  Carbonated/caffeinated beverages: no N/V/D/C/Gas: no Abdominal pain: no Dumping syndrome: no    NUTRITION DIAGNOSIS  Overweight/obesity ([place]-3.3) related to past poor dietary habits and physical inactivity as evidenced by completed bariatric surgery and following dietary guidelines for continued weight loss and healthy nutrition status.     NUTRITION INTERVENTION Nutrition counseling (C-1) and education (E-2) to facilitate bariatric surgery goals, including: . Diet advancement to the next phase now including starchy vegetables  . The importance of consuming adequate calories as well as certain nutrients daily due to the body's need for essential vitamins, minerals, and fats . The importance of daily physical activity and to  reach a goal of at least 150 minutes of moderate to vigorous physical activity weekly (or as directed by their physician) due to benefits such as increased musculature and improved lab values . Why you need complex carbohydrates: Whole grains and other complex carbohydrates are required to have a healthy diet. Whole grains provide fiber which can help with blood glucose levels and help keep you satiated. Fruits and starchy vegetables provide essential vitamins and minerals required for immune function, eyesight support, brain support, bone density, wound healing and many other functions within the body. According to the current evidenced  based 2015-2020 Dietary Guidelines for Americans, complex carbohydrates are part of a healthy eating pattern which is associated with a decreased risk for type 2 diabetes, cancers, and cardiovascular disease.    Goals: -it is okay to eat peanut butter more often throughout the week (stick to the serving size) -Add in starchy vegetable; eat at least one serving every day -Add in one serving of fruit (it is okay if you eat more than one)   Handouts Provided Include     Learning Style & Readiness for Change Teaching method utilized: Visual & Auditory  Demonstrated degree of understanding via: Teach Back  Barriers to learning/adherence to lifestyle change: learning curve   RD's Notes for Next Visit . Assess adherence to pt chosen goals . Assess pts hesitation with food/fear of weight gain   MONITORING & EVALUATION Dietary intake, weekly physical activity, body weight   Next Steps Patient is to follow-up in 1 months

## 2019-12-17 ENCOUNTER — Ambulatory Visit: Payer: BC Managed Care – PPO | Admitting: Professional

## 2019-12-31 ENCOUNTER — Ambulatory Visit (INDEPENDENT_AMBULATORY_CARE_PROVIDER_SITE_OTHER): Payer: BC Managed Care – PPO | Admitting: Professional

## 2019-12-31 DIAGNOSIS — F411 Generalized anxiety disorder: Secondary | ICD-10-CM | POA: Diagnosis not present

## 2020-01-14 ENCOUNTER — Ambulatory Visit: Payer: BC Managed Care – PPO | Admitting: Professional

## 2020-01-19 ENCOUNTER — Encounter: Payer: BC Managed Care – PPO | Attending: Surgery | Admitting: Skilled Nursing Facility1

## 2020-01-19 ENCOUNTER — Other Ambulatory Visit: Payer: Self-pay

## 2020-01-19 DIAGNOSIS — E669 Obesity, unspecified: Secondary | ICD-10-CM | POA: Diagnosis not present

## 2020-01-19 NOTE — Progress Notes (Signed)
Bariatric Nutrition Follow-Up Visit Medical Nutrition Therapy    Post-Operative sleeve from band Surgery Surgery Date: 08/25/2019  NUTRITION ASSESSMENT    Anthropometrics  Start weight at [facility]: 421.6 lbs (date: 08/30/2018) Today's weight: 320.7 lbs  Body Composition Scale 12/16/2019 01/19/2020  Weight  lbs 328 320.7  Total Body Fat  % 38.1 37.5     Visceral Fat 33 32  Fat-Free Mass  % 61.8 62.4     Total Body Water  % 42.8 43.4     Muscle-Mass  lbs 61.9 60.5  BMI 44.6 43.6  Body Fat Displacement          Torso  lbs 77.7 74.7        Left Leg  lbs 15.5 14.9        Right Leg  lbs 15.5 14.9        Left Arm  lbs 7.7 7.4        Right Arm  lbs 7.7 7.4   Clinical  Medical hx: colon cancer, HTN, OSA Medications:  Labs:    Lifestyle & Dietary Hx  Pt states he has been working a lot in front of his computer with goals to move more by walking more. Pt states he is excited to start BELT once his surgeon clears him. Pt states he feels nuts constipated him so he stopped eating those. Pt states he most likely overate the nuts. Pt state she is going to [place] this weekend and sates he did have cake for his anniversary. Pt states weighs all of his proteins aiming for 3 ounces. Pt states he started taking vitamin b12 due to feeling a lack of energy. Pt states he is having pain in his hip he will talk to his doctor about.    Estimated daily fluid intake: 64 oz Estimated daily protein intake: 90+ g Supplements: multi and calcium  Current average weekly physical activity: yard work  24-Hr Dietary Recall: 1 protein water a day; 15-20 grams protein per meal; adding mustard or mayo to foods; fruit 2 times a week First Meal: tuna with peppers and spinach and 1 egg or 2 [place] suasage and egg or skipped Snack: cottage cheese Second Meal: tuna and cucumber with 7 layer salad Snack: nuts Third Meal: salmon or tuna or chicken or shrimp with salad  Snack: sugar free pudding and baby bell  cheese  Beverages: fruit 2o, water, hot water in the morning   Post-Op Goals/ Signs/ Symptoms Using straws: no Drinking while eating: no Chewing/swallowing difficulties: no Changes in vision: no Changes to mood/headaches: no Hair loss/changes to skin/nails: no Difficulty focusing/concentrating: no Sweating: no Dizziness/lightheadedness: no Palpitations: no  Carbonated/caffeinated beverages: no N/V/D/C/Gas: no Abdominal pain: no Dumping syndrome: no    NUTRITION DIAGNOSIS  Overweight/obesity ([place]-3.3) related to past poor dietary habits and physical inactivity as evidenced by completed bariatric surgery and following dietary guidelines for continued weight loss and healthy nutrition status.     NUTRITION INTERVENTION Nutrition counseling (C-1) and education (E-2) to facilitate bariatric surgery goals, including: . Diet advancement to the next phase now including starchy vegetables and fruit . The importance of consuming adequate calories as well as certain nutrients daily due to the body's need for essential vitamins, minerals, and fats . The importance of daily physical activity and to reach a goal of at least 150 minutes of moderate to vigorous physical activity weekly (or as directed by their physician) due to benefits such as increased musculature and improved lab values . Why  you need complex carbohydrates: Whole grains and other complex carbohydrates are required to have a healthy diet. Whole grains provide fiber which can help with blood glucose levels and help keep you satiated. Fruits and starchy vegetables provide essential vitamins and minerals required for immune function, eyesight support, brain support, bone density, wound healing and many other functions within the body. According to the current evidenced based 2015-2020 Dietary Guidelines for Americans, complex carbohydrates are part of a healthy eating pattern which is associated with a decreased risk for type 2 diabetes,  cancers, and cardiovascular disease.    Goals: -Nuts serving is 1/4 a cup once a day -Add in starchy vegetable; eat at least one serving every day and Add in one serving of fruit (it is okay if you eat more than one) In order to increase energy level   Handouts Provided Include   Fruit serving sizes   Learning Style & Readiness for Change Teaching method utilized: Visual & Auditory  Demonstrated degree of understanding via: Teach Back  Barriers to learning/adherence to lifestyle change: learning curve   RD's Notes for Next Visit . Assess adherence to pt chosen goals . Assess pts hesitation with food/fear of weight gain   MONITORING & EVALUATION Dietary intake, weekly physical activity, body weight   Next Steps Patient is to follow-up in 3 months

## 2020-01-28 ENCOUNTER — Ambulatory Visit (INDEPENDENT_AMBULATORY_CARE_PROVIDER_SITE_OTHER): Payer: BC Managed Care – PPO | Admitting: Professional

## 2020-01-28 DIAGNOSIS — F411 Generalized anxiety disorder: Secondary | ICD-10-CM | POA: Diagnosis not present

## 2020-02-10 ENCOUNTER — Other Ambulatory Visit: Payer: Self-pay | Admitting: Cardiology

## 2020-02-11 ENCOUNTER — Ambulatory Visit: Payer: BC Managed Care – PPO | Admitting: Professional

## 2020-02-14 NOTE — Progress Notes (Signed)
Patient is here for follow up visit.  Subjective:   Cameron Jones, male    DOB: [birth year], 54 y.o.   MRN: [id number]   Chief Complaint  Patient presents with  . Hypertension  . Follow-up    6 month     HPI  54 y.o. African-American male with morbid obesity s/p lab band surgery in 2015, h/o colon cancer s/p resection in 2005, hypertension  At last visit in 07/2019, his hypertension was well controlled. I continued his hypertensive therapy. Patient had laparoscopic sleeve gastrectomy surgery in 08/27/2019. He has lost >100 lb weight. Barring few exceptions, his blood pressure is well controlled. He denies chest pain, shortness of breath, palpitations, leg edema, orthopnea, PND, TIA/syncope.    Current Outpatient Medications on File Prior to Visit  Medication Sig Dispense Refill  . acetaminophen (TYLENOL) 500 MG tablet Take 500 mg by mouth every 6 (six) hours as needed for moderate pain or headache.    Marland Kitchen amLODipine (NORVASC) 10 MG tablet TAKE 1 TABLET BY MOUTH EVERY DAY 90 tablet 0  . cholecalciferol (VITAMIN D3) 25 MCG (1000 UT) tablet Take 1,000 Units by mouth daily.    Marland Kitchen enoxaparin (LOVENOX) 60 MG/0.6ML injection Inject 0.6 mLs (60 mg total) into the skin every 12 (twelve) hours for 14 days. 16.8 mL 0  . ferrous sulfate 325 (65 FE) MG tablet Take 325 mg by mouth daily with breakfast.    . losartan (COZAAR) 25 MG tablet Take 1 tablet (25 mg total) by mouth daily. (Patient taking differently: Take 25 mg by mouth daily at 12 noon. ) 90 tablet 0  . Multiple Vitamins-Minerals (MULTIVITAMIN WITH MINERALS) tablet Take 1 tablet by mouth daily.     . vitamin B-12 (CYANOCOBALAMIN) 1000 MCG tablet Take 1,000 mcg by mouth daily.     No current facility-administered medications on file prior to visit.    Cardiovascular studies:  EKG 02/16/2020: Sinus rhythm  61 bpm Poor R-wave progression  Echocardiogram 08/19/2019: Left ventricle cavity is normal in size. Mild concentric  hypertrophy of the left ventricle. Normal LV systolic function with EF 56%. Normal global wall motion. Normal diastolic filling pattern. No significant valvular abnormality. Normal right atrial pressure.   Recent Labs: 11/24-12/20/2020: Glucose 98, BUN/Cr 15/0.8. Na/K 139/3.8. AST 48. ALT 63. Rest of the CMP normal H/H 14.8/45.9. MCV 89.6. Platelets 188.  05/20/2019: Chol 163, TG 93, HDL 36, LDL 108  04/03/2019: TSH 1.77 normal HbA1C 5.5%   Review of Systems  Cardiovascular: Negative for chest pain, dyspnea on exertion, leg swelling, palpitations and syncope.    Vitals:   02/16/20 0943  BP: 136/70  Pulse: (!) 53  Resp: 17  SpO2: 100%   There is no height or weight on file to calculate BMI.   Filed Weights   02/16/20 0943  Weight: (!) 313 lb 14.4 oz (142.4 kg)         Objective:      Physical Exam  Constitutional: No distress.  Morbidly obese  Neck: No JVD present.  Cardiovascular: Normal rate, regular rhythm, normal heart sounds and intact distal pulses.  No murmur heard. Pulmonary/Chest: Effort normal and breath sounds normal. He has no wheezes. He has no rales.  Abdominal:  Laparotomy scar  Musculoskeletal:        General: No edema.  Nursing note and vitals reviewed.       Assessment & Recommendations:   54 y.o. African-American male with morbid obesity s/p lab band surgery in 2015, h/o  colon cancer s/p resection in 2005, hypertension  Hypertension: Well-controlled.  Continue current antihypertensive therapy.  Morbid obesity: I congratulated him on >100 lb wt loss since bariatric surgery. I encouraged him to increase his physical activity.  OSA: Continue CPAP use  I will see him on as needed basis. Continue follow up with PCP.   Nigel Mormon, MD [facility] Pager: [phone] Office: [phone] If no answer Cell [phone]

## 2020-02-16 ENCOUNTER — Ambulatory Visit: Payer: BC Managed Care – PPO | Admitting: Cardiology

## 2020-02-16 ENCOUNTER — Encounter: Payer: Self-pay | Admitting: Cardiology

## 2020-02-16 ENCOUNTER — Other Ambulatory Visit: Payer: Self-pay

## 2020-02-16 VITALS — BP 136/70 | HR 53 | Resp 17 | Ht 72.0 in | Wt 313.9 lb

## 2020-02-16 DIAGNOSIS — G4733 Obstructive sleep apnea (adult) (pediatric): Secondary | ICD-10-CM

## 2020-02-16 DIAGNOSIS — I10 Essential (primary) hypertension: Secondary | ICD-10-CM

## 2020-02-25 ENCOUNTER — Ambulatory Visit (INDEPENDENT_AMBULATORY_CARE_PROVIDER_SITE_OTHER): Payer: BC Managed Care – PPO | Admitting: Professional

## 2020-02-25 DIAGNOSIS — F411 Generalized anxiety disorder: Secondary | ICD-10-CM

## 2020-03-10 ENCOUNTER — Ambulatory Visit: Payer: BC Managed Care – PPO | Admitting: Professional

## 2020-03-24 ENCOUNTER — Ambulatory Visit (INDEPENDENT_AMBULATORY_CARE_PROVIDER_SITE_OTHER): Payer: BC Managed Care – PPO | Admitting: Professional

## 2020-03-24 DIAGNOSIS — F411 Generalized anxiety disorder: Secondary | ICD-10-CM

## 2020-03-26 ENCOUNTER — Other Ambulatory Visit: Payer: Self-pay | Admitting: Surgery

## 2020-03-26 DIAGNOSIS — K439 Ventral hernia without obstruction or gangrene: Secondary | ICD-10-CM

## 2020-04-13 ENCOUNTER — Other Ambulatory Visit: Payer: Self-pay

## 2020-04-13 ENCOUNTER — Ambulatory Visit
Admission: RE | Admit: 2020-04-13 | Discharge: 2020-04-13 | Disposition: A | Discharge status: Home / Self Care | Payer: BC Managed Care – PPO | Source: Ambulatory Visit | Attending: Surgery | Admitting: Surgery

## 2020-04-13 DIAGNOSIS — K439 Ventral hernia without obstruction or gangrene: Secondary | ICD-10-CM

## 2020-04-13 MED ORDER — IOPAMIDOL (ISOVUE-300) INJECTION 61%
100.0000 mL | Freq: Once | INTRAVENOUS | Status: AC | PRN
  Administered 2020-04-13: 100 mL via INTRAVENOUS

## 2020-04-20 ENCOUNTER — Other Ambulatory Visit: Payer: Self-pay

## 2020-04-20 ENCOUNTER — Encounter: Payer: BC Managed Care – PPO | Attending: Surgery | Admitting: Skilled Nursing Facility1

## 2020-04-20 NOTE — Progress Notes (Signed)
Bariatric Nutrition Follow-Up Visit Medical Nutrition Therapy    Post-Operative sleeve from band Surgery Surgery Date: 08/25/2019  NUTRITION ASSESSMENT    Anthropometrics  Start weight at [facility]: 421.6 lbs (date: 08/30/2018) Today's weight: 301.3 lbs  Body Composition Scale 12/16/2019 01/19/2020 04/20/2020  Weight  lbs 328 320.7 301.3  Total Body Fat  % 38.1 37.5 35.7     Visceral Fat 33 32 29  Fat-Free Mass  % 61.8 62.4 64.2     Total Body Water  % 42.8 43.4 45.2     Muscle-Mass  lbs 61.9 60.5 56.8  BMI 44.6 43.6 40.9  Body Fat Displacement           Torso  lbs 77.7 74.7 66.8        Left Leg  lbs 15.5 14.9 13.3        Right Leg  lbs 15.5 14.9 13.3        Left Arm  lbs 7.7 7.4 6.6        Right Arm  lbs 7.7 7.4 6.6   Clinical  Medical hx: colon cancer, HTN, OSA Medications:  Labs:    Lifestyle & Dietary Hx  Pt states his hip is still hurting with his doctor appt coming next month. Pt states his hip pain does not get in the way of being as active as he wants to be. Pt states he is having constipation and feeling a pulling in his abdomen so he was checked for a hernia. Pt states he is food bland. Pt states he has added some fruit throughout the week stating since doing this has felt rejuvenated.    Estimated daily fluid intake: 64 oz Estimated daily protein intake: 90+ g Supplements: multi and calcium and b12 and vitamin d Current average weekly physical activity: yard work  24-Hr Dietary Recall: 1 protein water a day; 15-20 grams protein per meal; adding mustard or mayo to foods; fruit 2 times a week First Meal: tuna with peppers and spinach and 1 egg or 2 [place] suasage and egg or skipped Snack: cottage cheese Second Meal: tuna and cucumber with 7 layer salad + green leaf tomato and cucumber  Snack: nuts or yogurt Third Meal: salmon or tuna or chicken or shrimp with salad  Snack: sugar free pudding and baby bell cheese  Beverages: fruit 2o, water, hot water in the  morning   Post-Op Goals/ Signs/ Symptoms Using straws: no Drinking while eating: no Chewing/swallowing difficulties: no Changes in vision: no Changes to mood/headaches: no Hair loss/changes to skin/nails: no Difficulty focusing/concentrating: no Sweating: no Dizziness/lightheadedness: no Palpitations: no  Carbonated/caffeinated beverages: no N/V/D/C/Gas: miralax, constipation  Abdominal pain: no Dumping syndrome: no    NUTRITION DIAGNOSIS  Overweight/obesity ([place]-3.3) related to past poor dietary habits and physical inactivity as evidenced by completed bariatric surgery and following dietary guidelines for continued weight loss and healthy nutrition status.     NUTRITION INTERVENTION Nutrition counseling (C-1) and education (E-2) to facilitate bariatric surgery goals, including: . The importance of consuming adequate calories as well as certain nutrients daily due to the body's need for essential vitamins, minerals, and fats . The importance of daily physical activity and to reach a goal of at least 150 minutes of moderate to vigorous physical activity weekly (or as directed by their physician) due to benefits such as increased musculature and improved lab values . Why you need complex carbohydrates: Whole grains and other complex carbohydrates are required to have a healthy diet. Whole grains provide fiber  which can help with blood glucose levels and help keep you satiated. Fruits and starchy vegetables provide essential vitamins and minerals required for immune function, eyesight support, brain support, bone density, wound healing and many other functions within the body. According to the current evidenced based 2015-2020 Dietary Guidelines for Americans, complex carbohydrates are part of a healthy eating pattern which is associated with a decreased risk for type 2 diabetes, cancers, and cardiovascular disease.    Goals: -aim for 80 ounces of fluid per day -eat until satisfaction not  physically full  -Continue to accept the journey you are on and continuing to be mindful with your relationship with food  Handouts previously Provided Include   Fruit serving sizes   Learning Style & Readiness for Change Teaching method utilized: Visual & Auditory  Demonstrated degree of understanding via: Teach Back  Barriers to learning/adherence to lifestyle change: relationship with food  RD's Notes for Next Visit . Assess adherence to pt chosen goals . Assess pts hesitation with food/fear of weight gain   MONITORING & EVALUATION Dietary intake, weekly physical activity, body weight   Next Steps Patient is to follow-up in 3 months

## 2020-04-21 ENCOUNTER — Ambulatory Visit: Payer: BC Managed Care – PPO | Admitting: Professional

## 2020-04-28 ENCOUNTER — Ambulatory Visit: Payer: BC Managed Care – PPO | Admitting: Professional

## 2020-05-19 ENCOUNTER — Ambulatory Visit: Payer: Self-pay | Admitting: Professional

## 2020-05-19 DIAGNOSIS — Z0000 Encounter for general adult medical examination without abnormal findings: Secondary | ICD-10-CM | POA: Diagnosis not present

## 2020-05-19 DIAGNOSIS — E7800 Pure hypercholesterolemia, unspecified: Secondary | ICD-10-CM | POA: Diagnosis not present

## 2020-05-19 DIAGNOSIS — Z125 Encounter for screening for malignant neoplasm of prostate: Secondary | ICD-10-CM | POA: Diagnosis not present

## 2020-05-26 DIAGNOSIS — Z0000 Encounter for general adult medical examination without abnormal findings: Secondary | ICD-10-CM | POA: Diagnosis not present

## 2020-05-26 DIAGNOSIS — R82998 Other abnormal findings in urine: Secondary | ICD-10-CM | POA: Diagnosis not present

## 2020-05-26 DIAGNOSIS — I10 Essential (primary) hypertension: Secondary | ICD-10-CM | POA: Diagnosis not present

## 2020-05-27 DIAGNOSIS — Z1212 Encounter for screening for malignant neoplasm of rectum: Secondary | ICD-10-CM | POA: Diagnosis not present

## 2020-06-02 ENCOUNTER — Ambulatory Visit (INDEPENDENT_AMBULATORY_CARE_PROVIDER_SITE_OTHER): Payer: BC Managed Care – PPO | Admitting: Professional

## 2020-06-02 DIAGNOSIS — F411 Generalized anxiety disorder: Secondary | ICD-10-CM | POA: Diagnosis not present

## 2020-06-16 ENCOUNTER — Ambulatory Visit: Payer: BC Managed Care – PPO | Admitting: Professional

## 2020-07-20 ENCOUNTER — Ambulatory Visit: Payer: BC Managed Care – PPO | Admitting: Skilled Nursing Facility1

## 2020-07-21 ENCOUNTER — Encounter: Payer: BC Managed Care – PPO | Attending: Surgery | Admitting: Skilled Nursing Facility1

## 2020-07-21 ENCOUNTER — Other Ambulatory Visit: Payer: Self-pay

## 2020-07-21 NOTE — Progress Notes (Signed)
Bariatric Nutrition Follow-Up Visit Medical Nutrition Therapy    Post-Operative sleeve from band Surgery Surgery Date: 08/25/2019  NUTRITION ASSESSMENT    Anthropometrics  Start weight at [facility]: 421.6 lbs (date: 08/30/2018) Today's weight: 303.8 lbs  Body Composition Scale 12/16/2019 01/19/2020 04/20/2020 07/21/2020  Weight  lbs 328 320.7 301.3 303.8  Total Body Fat  % 38.1 37.5 35.7 35.9     Visceral Fat 33 32 29 29  Fat-Free Mass  % 61.8 62.4 64.2 64     Total Body Water  % 42.8 43.4 45.2 45     Muscle-Mass  lbs 61.9 60.5 56.8 57.3  BMI 44.6 43.6 40.9 41.3  Body Fat Displacement            Torso  lbs 77.7 74.7 66.8 67.8        Left Leg  lbs 15.5 14.9 13.3 13.5        Right Leg  lbs 15.5 14.9 13.3 13.5        Left Arm  lbs 7.7 7.4 6.6 6.7        Right Arm  lbs 7.7 7.4 6.6 6.7   Clinical  Medical hx: colon cancer, HTN, OSA Medications:  Labs:    Lifestyle & Dietary Hx  Pt states he started BELT today and is happy with that. Pt states he thinks his lack of weight loss were probably from some road trips he has been taking lately as well as not being as active and increased snacking. Pt states this past week he did have candied yams, rice and. Pt states he noticed his mind is starting to wonder to snacking so is trying to go for short walks when he feels that way at work. Pt states his surgeon would like him to lose more weight before he does the hernia surgery.   Estimated daily fluid intake: 64 oz Estimated daily protein intake: 90+ g Supplements: multi and calcium and b12 and vitamin d Current average weekly physical activity: BELT  24-Hr Dietary Recall: 1 protein water a day; 15-20 grams protein per meal; adding mustard or mayo to foods; fruit 2 times a week First Meal: tuna with peppers and spinach and 1 egg or 2 [place] suasage and egg or skipped Snack: cottage cheese Second Meal: tuna and cucumber with 7 layer salad + green leaf tomato and cucumber sometimes  grapes Snack: nuts or yogurt or fruit + peanut butter Third Meal: salmon or tuna or chicken or shrimp with salad or cheese quesadilla  Snack: sugar free pudding and baby bell cheese  Beverages: fruit 2o, water, hot water in the morning   Post-Op Goals/ Signs/ Symptoms Using straws: no Drinking while eating: no Chewing/swallowing difficulties: no Changes in vision: no Changes to mood/headaches: no Hair loss/changes to skin/nails: no Difficulty focusing/concentrating: no Sweating: no Dizziness/lightheadedness: no Palpitations: no  Carbonated/caffeinated beverages: no N/V/D/C/Gas: miralax, constipation  Abdominal pain: no Dumping syndrome: no    NUTRITION DIAGNOSIS  Overweight/obesity ([place]-3.3) related to past poor dietary habits and physical inactivity as evidenced by completed bariatric surgery and following dietary guidelines for continued weight loss and healthy nutrition status.     NUTRITION INTERVENTION Nutrition counseling (C-1) and education (E-2) to facilitate bariatric surgery goals, including: . The importance of consuming adequate calories as well as certain nutrients daily due to the body's need for essential vitamins, minerals, and fats . The importance of daily physical activity and to reach a goal of at least 150 minutes of moderate to vigorous physical activity  weekly (or as directed by their physician) due to benefits such as increased musculature and improved lab values Importance of vegetables . To have an overall healthy diet, adult men and women are recommended to consume anywhere from 2-3 cups of vegetables daily. Vegetables provide a wide range of vitamins and minerals such as vitamin A, vitamin C, potassium, and folic acid. According to the [employer], including fruit and vegetables daily may reduce the risk of cardiovascular disease, certain cancers, and other non-communicable diseases. Encouraged patient to honor their body's internal hunger and  fullness cues.  Throughout the day, check in mentally and rate hunger. Stop eating when satisfied not full regardless of how much food is left on the plate.  Get more if still hungry 20-30 minutes later.  The key is to honor satisfaction so throughout the meal, rate fullness factor and stop when comfortably satisfied not physically full. The key is to honor hunger and fullness without any feelings of guilt or shame.  Pay attention to what the internal cues are, rather than any external factors. This will enhance the confidence you have in listening to your own body and following those internal cues enabling you to increase how often you eat when you are hungry not out of appetite and stop when you are satisfied not full.  Encouraged pt to continue to eat balanced meals inclusive of non starchy vegetables 2 times a day 7 days a week Encouraged pt to choose lean protein sources: limiting beef, pork, sausage, hotdogs, and lunch meat Encourage pt to choose healthy fats such as plant based limiting animal fats . Encouraged pt to continue to drink a minium 64 fluid ounces with half being plain water to satisfy proper hydration   Handouts previously Provided Include   Fruit serving sizes   Learning Style & Readiness for Change Teaching method utilized: Visual & Auditory  Demonstrated degree of understanding via: Teach Back  Barriers to learning/adherence to lifestyle change: relationship with food  RD's Notes for Next Visit . Assess adherence to pt chosen goals   MONITORING & EVALUATION Dietary intake, weekly physical activity, body weight   Next Steps Patient is to follow-up in 4 months

## 2020-08-04 ENCOUNTER — Ambulatory Visit (INDEPENDENT_AMBULATORY_CARE_PROVIDER_SITE_OTHER): Payer: BC Managed Care – PPO | Admitting: Professional

## 2020-08-04 DIAGNOSIS — F411 Generalized anxiety disorder: Secondary | ICD-10-CM

## 2020-08-11 ENCOUNTER — Ambulatory Visit: Payer: BC Managed Care – PPO | Admitting: Professional

## 2020-08-16 ENCOUNTER — Ambulatory Visit (INDEPENDENT_AMBULATORY_CARE_PROVIDER_SITE_OTHER): Payer: BC Managed Care – PPO | Admitting: Professional

## 2020-08-16 DIAGNOSIS — F411 Generalized anxiety disorder: Secondary | ICD-10-CM

## 2020-08-25 ENCOUNTER — Inpatient Hospital Stay (HOSPITAL_COMMUNITY)
Admission: EM | Admit: 2020-08-25 | Discharge: 2020-08-28 | DRG: 389 | Disposition: A | Discharge status: Home / Self Care | Payer: BC Managed Care – PPO | Attending: Internal Medicine | Admitting: Internal Medicine

## 2020-08-25 ENCOUNTER — Emergency Department (HOSPITAL_COMMUNITY): Payer: BC Managed Care – PPO

## 2020-08-25 ENCOUNTER — Encounter (HOSPITAL_COMMUNITY): Payer: Self-pay

## 2020-08-25 ENCOUNTER — Other Ambulatory Visit: Payer: Self-pay

## 2020-08-25 ENCOUNTER — Inpatient Hospital Stay (HOSPITAL_COMMUNITY): Payer: BC Managed Care – PPO

## 2020-08-25 DIAGNOSIS — Z85028 Personal history of other malignant neoplasm of stomach: Secondary | ICD-10-CM

## 2020-08-25 DIAGNOSIS — K56600 Partial intestinal obstruction, unspecified as to cause: Secondary | ICD-10-CM | POA: Diagnosis not present

## 2020-08-25 DIAGNOSIS — Z9049 Acquired absence of other specified parts of digestive tract: Secondary | ICD-10-CM

## 2020-08-25 DIAGNOSIS — K760 Fatty (change of) liver, not elsewhere classified: Secondary | ICD-10-CM | POA: Diagnosis present

## 2020-08-25 DIAGNOSIS — Z800 Family history of malignant neoplasm of digestive organs: Secondary | ICD-10-CM

## 2020-08-25 DIAGNOSIS — K436 Other and unspecified ventral hernia with obstruction, without gangrene: Secondary | ICD-10-CM | POA: Diagnosis not present

## 2020-08-25 DIAGNOSIS — Z820 Family history of epilepsy and other diseases of the nervous system: Secondary | ICD-10-CM

## 2020-08-25 DIAGNOSIS — Z8249 Family history of ischemic heart disease and other diseases of the circulatory system: Secondary | ICD-10-CM | POA: Diagnosis not present

## 2020-08-25 DIAGNOSIS — K219 Gastro-esophageal reflux disease without esophagitis: Secondary | ICD-10-CM | POA: Diagnosis not present

## 2020-08-25 DIAGNOSIS — Z20822 Contact with and (suspected) exposure to covid-19: Secondary | ICD-10-CM | POA: Diagnosis present

## 2020-08-25 DIAGNOSIS — E669 Obesity, unspecified: Secondary | ICD-10-CM | POA: Diagnosis present

## 2020-08-25 DIAGNOSIS — Z85038 Personal history of other malignant neoplasm of large intestine: Secondary | ICD-10-CM | POA: Diagnosis not present

## 2020-08-25 DIAGNOSIS — R109 Unspecified abdominal pain: Secondary | ICD-10-CM | POA: Diagnosis not present

## 2020-08-25 DIAGNOSIS — K439 Ventral hernia without obstruction or gangrene: Secondary | ICD-10-CM | POA: Diagnosis not present

## 2020-08-25 DIAGNOSIS — K6389 Other specified diseases of intestine: Secondary | ICD-10-CM | POA: Diagnosis not present

## 2020-08-25 DIAGNOSIS — I10 Essential (primary) hypertension: Secondary | ICD-10-CM | POA: Diagnosis present

## 2020-08-25 DIAGNOSIS — K56609 Unspecified intestinal obstruction, unspecified as to partial versus complete obstruction: Secondary | ICD-10-CM | POA: Diagnosis not present

## 2020-08-25 DIAGNOSIS — Z79899 Other long term (current) drug therapy: Secondary | ICD-10-CM | POA: Diagnosis not present

## 2020-08-25 DIAGNOSIS — K5669 Other partial intestinal obstruction: Secondary | ICD-10-CM | POA: Diagnosis not present

## 2020-08-25 DIAGNOSIS — Z803 Family history of malignant neoplasm of breast: Secondary | ICD-10-CM

## 2020-08-25 LAB — LIPASE, BLOOD: Lipase: 25 U/L (ref 11–51)

## 2020-08-25 LAB — COMPREHENSIVE METABOLIC PANEL
ALT: 34 U/L (ref 0–44)
AST: 28 U/L (ref 15–41)
Albumin: 4.5 g/dL (ref 3.5–5.0)
Alkaline Phosphatase: 42 U/L (ref 38–126)
Anion gap: 12 (ref 5–15)
BUN: 13 mg/dL (ref 6–20)
CO2: 26 mmol/L (ref 22–32)
Calcium: 9.4 mg/dL (ref 8.9–10.3)
Chloride: 103 mmol/L (ref 98–111)
Creatinine, Ser: 0.95 mg/dL (ref 0.61–1.24)
GFR, Estimated: >60 mL/min (ref >60)
Glucose, Bld: 137 mg/dL — ABNORMAL HIGH (ref 70–99)
Potassium: 3.7 mmol/L (ref 3.5–5.1)
Sodium: 141 mmol/L (ref 135–145)
Total Bilirubin: 0.7 mg/dL (ref 0.3–1.2)
Total Protein: 7.3 g/dL (ref 6.5–8.1)

## 2020-08-25 LAB — CBC WITH DIFFERENTIAL/PLATELET
Abs Immature Granulocytes: 0.02 10*3/uL (ref 0.00–0.07)
Basophils Absolute: 0 10*3/uL (ref 0.0–0.1)
Basophils Relative: 0 %
Eosinophils Absolute: 0 10*3/uL (ref 0.0–0.5)
Eosinophils Relative: 1 %
HCT: 49.3 % (ref 39.0–52.0)
Hemoglobin: 16 g/dL (ref 13.0–17.0)
Immature Granulocytes: 0 %
Lymphocytes Relative: 23 %
Lymphs Abs: 1.6 10*3/uL (ref 0.7–4.0)
MCH: 29.3 pg (ref 26.0–34.0)
MCHC: 32.5 g/dL (ref 30.0–36.0)
MCV: 90.1 fL (ref 80.0–100.0)
Monocytes Absolute: 0.6 10*3/uL (ref 0.1–1.0)
Monocytes Relative: 8 %
Neutro Abs: 4.6 10*3/uL (ref 1.7–7.7)
Neutrophils Relative %: 68 %
Platelets: 180 10*3/uL (ref 150–400)
RBC: 5.47 MIL/uL (ref 4.22–5.81)
RDW: 13.9 % (ref 11.5–15.5)
WBC: 6.8 10*3/uL (ref 4.0–10.5)
nRBC: 0 % (ref 0.0–0.2)

## 2020-08-25 LAB — URINALYSIS, ROUTINE W REFLEX MICROSCOPIC
Bilirubin Urine: NEGATIVE
Glucose, UA: NEGATIVE mg/dL
Hgb urine dipstick: NEGATIVE
Ketones, ur: NEGATIVE mg/dL
Leukocytes,Ua: NEGATIVE
Nitrite: NEGATIVE
Protein, ur: NEGATIVE mg/dL
Specific Gravity, Urine: 1.016 (ref 1.005–1.030)
pH: 9 — ABNORMAL HIGH (ref 5.0–8.0)

## 2020-08-25 LAB — HIV ANTIBODY (ROUTINE TESTING W REFLEX): HIV Screen 4th Generation wRfx: NONREACTIVE

## 2020-08-25 LAB — RESP PANEL BY RT-PCR (FLU A&B, COVID) ARPGX2
Influenza A by PCR: NEGATIVE
Influenza B by PCR: NEGATIVE
SARS Coronavirus 2 by RT PCR: NEGATIVE

## 2020-08-25 MED ORDER — SODIUM CHLORIDE 0.9 % IV BOLUS
1000.0000 mL | Freq: Once | INTRAVENOUS | Status: AC
  Administered 2020-08-25: 1000 mL via INTRAVENOUS

## 2020-08-25 MED ORDER — ONDANSETRON HCL 4 MG/2ML IJ SOLN
4.0000 mg | Freq: Once | INTRAMUSCULAR | Status: AC
  Administered 2020-08-25: 4 mg via INTRAVENOUS
  Filled 2020-08-25: qty 2

## 2020-08-25 MED ORDER — SODIUM CHLORIDE (PF) 0.9 % IJ SOLN
INTRAMUSCULAR | Status: AC
  Filled 2020-08-25: qty 50

## 2020-08-25 MED ORDER — LACTATED RINGERS IV SOLN: INTRAVENOUS | Status: DC

## 2020-08-25 MED ORDER — MORPHINE SULFATE (PF) 4 MG/ML IV SOLN
4.0000 mg | Freq: Once | INTRAVENOUS | Status: AC
  Administered 2020-08-25: 4 mg via INTRAVENOUS
  Filled 2020-08-25: qty 1

## 2020-08-25 MED ORDER — HYDRALAZINE HCL 20 MG/ML IJ SOLN
10.0000 mg | INTRAMUSCULAR | Status: DC | PRN
  Administered 2020-08-25: 20 mg via INTRAVENOUS
  Filled 2020-08-25: qty 1

## 2020-08-25 MED ORDER — SODIUM CHLORIDE 0.9 % IV SOLN: INTRAVENOUS | Status: DC

## 2020-08-25 MED ORDER — DIATRIZOATE MEGLUMINE & SODIUM 66-10 % PO SOLN
90.0000 mL | Freq: Once | ORAL | Status: AC
  Administered 2020-08-25: 90 mL via ORAL
  Filled 2020-08-25 (×2): qty 90

## 2020-08-25 MED ORDER — ENOXAPARIN SODIUM 40 MG/0.4ML ~~LOC~~ SOLN
40.0000 mg | SUBCUTANEOUS | Status: DC
  Administered 2020-08-25 – 2020-08-27 (×3): 40 mg via SUBCUTANEOUS
  Filled 2020-08-25 (×4): qty 0.4

## 2020-08-25 MED ORDER — ONDANSETRON HCL 4 MG PO TABS: 4.0000 mg | ORAL_TABLET | Freq: Four times a day (QID) | ORAL | Status: DC | PRN

## 2020-08-25 MED ORDER — IOHEXOL 300 MG/ML  SOLN
100.0000 mL | Freq: Once | INTRAMUSCULAR | Status: AC | PRN
  Administered 2020-08-25: 100 mL via INTRAVENOUS

## 2020-08-25 MED ORDER — MORPHINE SULFATE (PF) 2 MG/ML IV SOLN
2.0000 mg | INTRAVENOUS | Status: DC | PRN
  Administered 2020-08-25: 2 mg via INTRAVENOUS
  Administered 2020-08-25: 4 mg via INTRAVENOUS
  Administered 2020-08-25: 2 mg via INTRAVENOUS
  Filled 2020-08-25 (×2): qty 1
  Filled 2020-08-25: qty 2

## 2020-08-25 MED ORDER — ONDANSETRON HCL 4 MG/2ML IJ SOLN
4.0000 mg | Freq: Four times a day (QID) | INTRAMUSCULAR | Status: DC | PRN
  Administered 2020-08-25 (×2): 4 mg via INTRAVENOUS
  Filled 2020-08-25 (×2): qty 2

## 2020-08-25 NOTE — ED Notes (Signed)
Called report to [place], RN on 3E. Leanne requested that we wait a few minutes to bring the patient up because she is taking report on other patients. This nurse asked the NT to take the patient upstairs around 0800.

## 2020-08-25 NOTE — H&P (Signed)
History and Physical    Cameron Jones IZT:[id number] DOB: 01/16/1966 DOA: 08/25/2020  PCP: Haywood Pao, MD  Patient coming from: Home  I have personally briefly reviewed patient's old medical records in [place]  Chief Complaint: Abd pain  HPI: Cameron Jones is a 54 y.o. male with medical history significant of obesity, HTN, colon CA s/p colectomy, sleeve gastrectomy last year, chronic ventral hernia with prior SBOs.  Pt presents to the ED with c/o onset of abd pain after eating a salad at 5 or 6pm which has become increasingly severe.  At first thought it was gas or reflux, tried taking pepcid and then drinking water and baking soda but symptoms have continued to worsen.  Severe nausea but no vomiting.  No flatus nor BM since symptoms onset.   ED Course: CT confirms SBO with transition point at start of a large ventral hernia.   Review of Systems: As per HPI, otherwise all review of systems negative.  Past Medical History:  Diagnosis Date  . Arthritis    Knee  . Cancer ([place])    colon  . Carpal tunnel syndrome of right wrist    resolved   . Fatty liver    per Dr Radford Pax note  . GERD (gastroesophageal reflux disease)   . Hernia   . History of blood transfusion   . Hypertension   . Lower back pain   . Obesity   . Sleep apnea    severe per report 12/11  on chart  . Ventral hernia     Past Surgical History:  Procedure Laterality Date  . BREATH TEK H PYLORI  11/07/2011   Procedure: BREATH TEK H PYLORI;  Surgeon: Shann Medal, MD;  Location: [facility];  Service: General;  Laterality: N/A;  . BREATH TEK H PYLORI  01/02/2012   Procedure: BREATH TEK H PYLORI;  Surgeon: Pedro Earls, MD;  Location: [facility];  Service: General;  Laterality: N/A;  . COLON SURGERY  06/2006   colectomy  . COLONOSCOPY N/A 05/13/2013   Procedure: COLONOSCOPY;  Surgeon: Winfield Cunas., MD;  Location: [facility];  Service: Endoscopy;  Laterality: N/A;  .  COLONOSCOPY N/A 08/30/2017   Procedure: COLONOSCOPY;  Surgeon: Laurence Spates, MD;  Location: [facility];  Service: Endoscopy;  Laterality: N/A;  . INGUINAL HERNIA REPAIR N/A 08/25/2019   Procedure: HERNIA REPAIR INCARCERATED;  Surgeon: Johnathan Hausen, MD;  Location: [facility];  Service: General;  Laterality: N/A;  . LAPAROSCOPIC GASTRIC BAND REMOVAL WITH LAPAROSCOPIC GASTRIC SLEEVE RESECTION N/A 08/25/2019   Procedure: LAPAROSCOPIC GASTRIC BAND REMOVAL WITH LAPAROSCOPIC GASTRIC SLEEVE RESECTION, Upper Endo, ERAS Pathway;  Surgeon: Johnathan Hausen, MD;  Location: [facility];  Service: General;  Laterality: N/A;  . LAPAROSCOPIC GASTRIC BANDING  05/07/2012   Procedure: LAPAROSCOPIC GASTRIC BANDING;  Surgeon: Pedro Earls, MD;  Location: [facility];  Service: General;  Laterality: N/A;  . LAPAROSCOPIC LYSIS OF ADHESIONS N/A 08/25/2019   Procedure: LAPAROSCOPIC LYSIS OF ADHESIONS;  Surgeon: Johnathan Hausen, MD;  Location: [facility];  Service: General;  Laterality: N/A;     reports that he has never smoked. He has never used smokeless tobacco. He reports previous alcohol use of about 1.0 standard drink of alcohol per week. He reports that he does not use drugs.  No Known Allergies  Family History  Problem Relation Age of Onset  . Cancer Mother        pancreatic  . Cancer Brother  colon  . Alzheimer's disease Father   . Hypertension Sister   . Cancer Maternal Aunt        breast     Prior to Admission medications   Medication Sig Start Date End Date Taking? Authorizing Provider  acetaminophen (TYLENOL) 500 MG tablet Take 500 mg by mouth every 6 (six) hours as needed for moderate pain or headache.   Yes [provider]  calcium carbonate (OS-CAL) 1250 (500 Ca) MG chewable tablet Chew 1 tablet by mouth daily.   Yes [provider]  cholecalciferol (VITAMIN D3) 25 MCG (1000 UT) tablet Take 1,000 Units by mouth daily.   Yes [provider]  ferrous sulfate 325 (65 FE)  MG tablet Take 325 mg by mouth daily with breakfast.   Yes [provider]  Multiple Vitamins-Minerals (MULTIVITAMIN WITH MINERALS) tablet Take 1 tablet by mouth daily.    Yes [provider]  vitamin B-12 (CYANOCOBALAMIN) 1000 MCG tablet Take 1,000 mcg by mouth daily.   Yes [provider]    Physical Exam: Vitals:   08/25/20 0016 08/25/20 0254 08/25/20 0430 08/25/20 0445  BP: (!) 178/93 (!) 166/69 (!) 178/95 (!) 184/94  Pulse: (!) 58 77 73 68  Resp: 16 17    Temp: 98.4 F (36.9 C)     TempSrc: Oral     SpO2: 100% 100% 96% 97%    Constitutional: NAD, calm, comfortable Eyes: PERRL, lids and conjunctivae normal ENMT: Mucous membranes are moist. Posterior pharynx clear of any exudate or lesions.Normal dentition.  Neck: normal, supple, no masses, no thyromegaly Respiratory: clear to auscultation bilaterally, no wheezing, no crackles. Normal respiratory effort. No accessory muscle use.  Cardiovascular: Regular rate and rhythm, no murmurs / rubs / gallops. No extremity edema. 2+ pedal pulses. No carotid bruits.  Abdomen: Diffuse TTP, obese  Musculoskeletal: no clubbing / cyanosis. No joint deformity upper and lower extremities. Good ROM, no contractures. Normal muscle tone.  Skin: no rashes, lesions, ulcers. No induration Neurologic: CN 2-12 grossly intact. Sensation intact, DTR normal. Strength 5/5 in all 4.  Psychiatric: Normal judgment and insight. Alert and oriented x 3. Normal mood.    Labs on Admission: I have personally reviewed following labs and imaging studies  CBC: Recent Labs  Lab 08/25/20 0157  WBC 6.8  NEUTROABS 4.6  HGB 16.0  HCT 49.3  MCV 90.1  PLT 017   Basic Metabolic Panel: Recent Labs  Lab 08/25/20 0157  NA 141  K 3.7  CL 103  CO2 26  GLUCOSE 137*  BUN 13  CREATININE 0.95  CALCIUM 9.4   GFR: CrCl cannot be calculated (Unknown ideal weight.). Liver Function Tests: Recent Labs  Lab 08/25/20 0157  AST 28  ALT 34    ALKPHOS 42  BILITOT 0.7  PROT 7.3  ALBUMIN 4.5   Recent Labs  Lab 08/25/20 0157  LIPASE 25   No results for input(s): AMMONIA in the last 168 hours. Coagulation Profile: No results for input(s): INR, PROTIME in the last 168 hours. Cardiac Enzymes: No results for input(s): CKTOTAL, CKMB, CKMBINDEX, TROPONINI in the last 168 hours. BNP (last 3 results) No results for input(s): PROBNP in the last 8760 hours. HbA1C: No results for input(s): HGBA1C in the last 72 hours. CBG: No results for input(s): GLUCAP in the last 168 hours. Lipid Profile: No results for input(s): CHOL, HDL, LDLCALC, TRIG, CHOLHDL, LDLDIRECT in the last 72 hours. Thyroid Function Tests: No results for input(s): TSH, T4TOTAL, FREET4, T3FREE, THYROIDAB  in the last 72 hours. Anemia Panel: No results for input(s): VITAMINB12, FOLATE, FERRITIN, TIBC, IRON, RETICCTPCT in the last 72 hours. Urine analysis:    Component Value Date/Time   COLORURINE YELLOW 08/25/2020 0400   APPEARANCEUR HAZY (A) 08/25/2020 0400   LABSPEC 1.016 08/25/2020 0400   PHURINE 9.0 (H) 08/25/2020 0400   GLUCOSEU NEGATIVE 08/25/2020 0400   HGBUR NEGATIVE 08/25/2020 0400   BILIRUBINUR NEGATIVE 08/25/2020 0400   KETONESUR NEGATIVE 08/25/2020 0400   PROTEINUR NEGATIVE 08/25/2020 0400   UROBILINOGEN 0.2 11/10/2011 1045   NITRITE NEGATIVE 08/25/2020 0400   LEUKOCYTESUR NEGATIVE 08/25/2020 0400    Radiological Exams on Admission: CT Abdomen Pelvis W Contrast  Result Date: 08/25/2020 CLINICAL DATA:  Postprandial abdominal pain, remote history of small-bowel obstruction and bariatric surgery EXAM: CT ABDOMEN AND PELVIS WITH CONTRAST TECHNIQUE: Multidetector CT imaging of the abdomen and pelvis was performed using the standard protocol following bolus administration of intravenous contrast. CONTRAST:  139mL OMNIPAQUE IOHEXOL 300 MG/ML  SOLN COMPARISON:  04/13/2020 FINDINGS: Lower chest: Small left diaphragmatic hernia with herniation of  retroperitoneal fat into the thoracic cavity. The visualized lung bases are otherwise clear. The visualized heart and pericardium are unremarkable. Hepatobiliary: No focal liver abnormality is seen. No gallstones, gallbladder wall thickening, or biliary dilatation. Pancreas: Unremarkable Spleen: Unremarkable Adrenals/Urinary Tract: Indeterminate 23 mm nodule is seen within the left adrenal gland is stable from multiple remote prior examinations dating as far back as [id number]/42/[id number], almost certainly representing a benign adrenal adenoma. Right adrenal gland is unremarkable. The kidneys are normal in size and position. Simple cortical cyst noted within the left kidney. The kidneys are otherwise unremarkable. Bladder is unremarkable. Stomach/Bowel: Surgical changes of gastric sleeve resection and right hemicolectomy are identified. Large ventral hernia is present containing multiple loops of unremarkable large and small bowel. There is, however, a a distal small bowel obstruction present with multiple dilated fluid-filled loops of small bowel extending to a single point of transition as the bowel enters into the hernia sac, best seen on axial image # 50 and coronal image # 53. No free intraperitoneal gas or fluid. Vascular/Lymphatic: Mild atherosclerotic calcification within the abdominal aorta. No aortic aneurysm. The abdominal vasculature is otherwise unremarkable. No pathologic adenopathy. Reproductive: Prostate is unremarkable. Other: Rectum unremarkable Musculoskeletal: Degenerative changes are seen within the lumbar spine. No suspicious lytic or blastic bone lesions are seen. IMPRESSION: Small-bowel obstruction with single point of transition involving the mid to distal small bowel entering into the ventral hernia sac. Large ventral hernia containing multiple loops of large and small bowel including the ileocolic anastomosis. Status post gastric sleeve resection and right hemicolectomy. Aortic Atherosclerosis  (ICD10-I70.0). Electronically Signed   By: Fidela Salisbury MD   On: 08/25/2020 03:22    EKG: Independently reviewed.  Assessment/Plan Principal Problem:   SBO (small bowel obstruction) (HCC) Active Problems:   Ventral hernia-midline   Essential hypertension   Ventral hernia with obstruction and without gangrene    1. SBO - 1. Transition point at start of large ventral hernia. 2. NPO 3. IVF 4. Morphine PRN pain 5. Per Dr. Georgette Dover: 1. NGT if he develops intractable N/V 2. Gen surg to see in consult this AM 6. Pt may end up requesting NGT anyhow due to severity of abd pain he says (pt has had NGT x2 times in past and so knows what to expect here). 2. HTN - 1. Not on chronic BP meds it seems 2. PRN hydralazine ordered  DVT prophylaxis: Lovenox Code  Status: Full Family Communication: No family in room Disposition Plan: Home after SBO resolved Consults called: EDP spoke with Dr. Georgette Dover Admission status: Admit to inpatient  Severity of Illness: The appropriate patient status for this patient is INPATIENT. Inpatient status is judged to be reasonable and necessary in order to provide the required intensity of service to ensure the patient's safety. The patient's presenting symptoms, physical exam findings, and initial radiographic and laboratory data in the context of their chronic comorbidities is felt to place them at high risk for further clinical deterioration. Furthermore, it is not anticipated that the patient will be medically stable for discharge from the hospital within 2 midnights of admission. The following factors support the patient status of inpatient.   IP status due to SBO, NPO status.  * I certify that at the point of admission it is my clinical judgment that the patient will require inpatient hospital care spanning beyond 2 midnights from the point of admission due to high intensity of service, high risk for further deterioration and high frequency of surveillance  required.*    Kaius Daino M. DO [facility]  How to contact the [facility] Attending or Consulting provider [place] or covering provider during after hours [place], for this patient?  1. Check the care team in [facility] and look for a) attending/consulting [facility] provider listed and b) the [facility] team listed 2. Log into [email or web address]  Amion Physician Scheduling and messaging for groups and whole hospitals  On call and physician scheduling software for group practices, residents, hospitalists and other medical providers for call, clinic, rotation and shift schedules. On[employer] is a hospital-wide system for scheduling doctors and paging doctors on call. EasyPlot is for scientific plotting and data analysis.  [email or web address]  and use [place]'s universal password to access. If you do not have the password, please contact the hospital operator.  3. Locate the [facility] provider you are looking for under [facility] and page to a number that you can be directly reached. 4. If you still have difficulty reaching the provider, please page the [facility] (Director on Call) for the Hospitalists listed on amion for assistance.  08/25/2020, 4:59 AM

## 2020-08-25 NOTE — ED Provider Notes (Signed)
[facility] DEPT Provider Note   CSN: [id number] Arrival date & time: 08/25/20  0010     History Chief Complaint  Patient presents with  . Abdominal Pain    C[facility]on Jones is a 54 y.o. male.  [facility] is a 54 y.o. male with a history of obesity, hypertension, GERD, colon cancer s/p colectomy, previous ventral hernias and small bowel obstruction and gastric sleeve surgery, who presents to the emergency department for evaluation of abdominal pain.  He reports that pain started suddenly around 5 or 6 PM after he had eaten a salad for dinner.  He reports pain is present in the periumbilical region and has become increasingly severe.  He states that at first he thought this was just gas or reflux and took some Pepcid and drink some water with baking soda but reports that his symptoms have continued to worsen and he has felt extremely nauseated but has not vomited.  He has not been able to pass gas or move his bowels since symptoms began.  He has history of previous small bowel obstruction and states this feels similar he is concerned he is having this again.  Had gastric sleeve surgery a year ago in the attempted to do some hernia repair at this time he has not had similar symptoms since his surgery until today.  Patient's surgeon is Dr. Hassell Done.  No associated chest pain or shortness of breath.  No fevers.  No other aggravating or alleviating factors.        Past Medical History:  Diagnosis Date  . Arthritis    Knee  . Cancer ([place])    colon  . Carpal tunnel syndrome of right wrist    resolved   . Fatty liver    per Dr Radford Pax note  . GERD (gastroesophageal reflux disease)   . Hernia   . History of blood transfusion   . Hypertension   . Lower back pain   . Obesity   . Sleep apnea    severe per report 12/11  on chart  . Ventral hernia     Patient Active Problem List   Diagnosis Date Noted  . Ventral hernia with obstruction and without  gangrene 08/25/2020  . 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 Essential hypertension 11/23/2018  . OSA (obstructive sleep apnea) 07/26/2017  . SBO (small bowel obstruction) ([place]) 12/27/2016  . 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 Colon cancer-right T3 N0, Oct 2007 10/06/2011  . Ventral hernia-midline 10/06/2011  . Obstructive sleep apnea of adult 10/06/2011  . Morbid obesity ([place]) 10/06/2011    Past Surgical History:  Procedure Laterality Date  . BREATH TEK H PYLORI  11/07/2011   Procedure: BREATH TEK H PYLORI;  Surgeon: Shann Medal, MD;  Location: [facility];  Service: General;  Laterality: N/A;  . BREATH TEK H PYLORI  01/02/2012   Procedure: BREATH TEK H PYLORI;  Surgeon: Pedro Earls, MD;  Location: [facility];  Service: General;  Laterality: N/A;  . COLON SURGERY  06/2006   colectomy  . COLONOSCOPY N/A 05/13/2013   Procedure: COLONOSCOPY;  Surgeon: Winfield Cunas., MD;  Location: [facility];  Service: Endoscopy;  Laterality: N/A;  . COLONOSCOPY N/A 08/30/2017   Procedure: COLONOSCOPY;  Surgeon: Laurence Spates, MD;  Location: [facility];  Service: Endoscopy;  Laterality: N/A;  . INGUINAL HERNIA REPAIR N/A 08/25/2019   Procedure: HERNIA REPAIR INCARCERATED;  Surgeon: Johnathan Hausen, MD;  Location: [facility];  Service: General;  Laterality: N/A;  . LAPAROSCOPIC GASTRIC BAND REMOVAL WITH LAPAROSCOPIC GASTRIC SLEEVE RESECTION N/A 08/25/2019   Procedure: LAPAROSCOPIC GASTRIC BAND REMOVAL WITH LAPAROSCOPIC GASTRIC SLEEVE RESECTION, Upper Endo, ERAS Pathway;  Surgeon: Johnathan Hausen, MD;  Location: [facility];  Service: General;  Laterality: N/A;  . LAPAROSCOPIC GASTRIC BANDING  05/07/2012   Procedure: LAPAROSCOPIC GASTRIC BANDING;  Surgeon: Pedro Earls, MD;  Location: [facility];  Service: General;  Laterality: N/A;  . LAPAROSCOPIC LYSIS OF ADHESIONS N/A 08/25/2019   Procedure: LAPAROSCOPIC LYSIS OF ADHESIONS;  Surgeon: Johnathan Hausen, MD;  Location: [facility];   Service: General;  Laterality: N/A;       Family History  Problem Relation Age of Onset  . Cancer Mother        pancreatic  . Cancer Brother        colon  . Alzheimer's disease Father   . Hypertension Sister   . Cancer Maternal Aunt        breast    Social History   Tobacco Use  . Smoking status: Never Smoker  . Smokeless tobacco: Never Used  Vaping Use  . Vaping Use: Never used  Substance Use Topics  . Alcohol use: Not Currently    Alcohol/week: 1.0 standard drink    Types: 1 Standard drinks or equivalent per week  . Drug use: No    Home Medications Prior to Admission medications   Medication Sig Start Date End Date Taking? Authorizing Provider  acetaminophen (TYLENOL) 500 MG tablet Take 500 mg by mouth every 6 (six) hours as needed for moderate pain or headache.   Yes [provider]  calcium carbonate (OS-CAL) 1250 (500 Ca) MG chewable tablet Chew 1 tablet by mouth daily.   Yes [provider]  cholecalciferol (VITAMIN D3) 25 MCG (1000 UT) tablet Take 1,000 Units by mouth daily.   Yes [provider]  ferrous sulfate 325 (65 FE) MG tablet Take 325 mg by mouth daily with breakfast.   Yes [provider]  Multiple Vitamins-Minerals (MULTIVITAMIN WITH MINERALS) tablet Take 1 tablet by mouth daily.    Yes [provider]  vitamin B-12 (CYANOCOBALAMIN) 1000 MCG tablet Take 1,000 mcg by mouth daily.   Yes [provider]    Allergies    Patient has no known allergies.  Review of Systems   Review of Systems  Constitutional: Negative for chills and fever.  HENT: Negative.   Respiratory: Negative for cough and shortness of breath.   Cardiovascular: Negative for chest pain.  Gastrointestinal: Positive for abdominal pain, constipation and nausea. Negative for blood in stool, diarrhea and vomiting.  Genitourinary: Negative for dysuria, flank pain and frequency.  Musculoskeletal: Negative for arthralgias and myalgias.    Skin: Negative for color change and rash.  Neurological: Negative for dizziness, syncope and light-headedness.  All other systems reviewed and are negative.   Physical Exam Updated Vital Signs BP (!) 178/93 (BP Location: Left Arm)   Pulse (!) 58   Temp 98.4 F (36.9 C) (Oral)   Resp 16   SpO2 100%   Physical Exam Vitals and nursing note reviewed.  Constitutional:      General: He is not in acute distress.    Appearance: He is well-developed. He is obese. He is ill-appearing. He is not diaphoretic.     Comments: Patient alert, standing at the side of the bed bending over in pain, ill-appearing  HENT:     Head: Normocephalic and atraumatic.     Mouth/Throat:  Mouth: Mucous membranes are moist.     Pharynx: Oropharynx is clear.  Eyes:     General:        Right eye: No discharge.        Left eye: No discharge.     Pupils: Pupils are equal, round, and reactive to light.  Cardiovascular:     Rate and Rhythm: Normal rate and regular rhythm.     Heart sounds: Normal heart sounds. No murmur heard.  No friction rub. No gallop.   Pulmonary:     Effort: Pulmonary effort is normal. No respiratory distress.     Breath sounds: Normal breath sounds. No wheezing or rales.     Comments: Respirations equal and unlabored, patient able to speak in full sentences, lungs clear to auscultation bilaterally Abdominal:     General: Bowel sounds are normal. There is no distension.     Palpations: Abdomen is soft. There is no mass.     Tenderness: There is abdominal tenderness in the periumbilical area. There is guarding.     Comments: Obese abdomen, soft and nondistended, bowel sounds present, midline surgical scar noted, unable to differentiate hernia from pannus due to body habitus, patient with focal tenderness in the periumbilical region with several slight guarding, nontender in other quadrants.  Musculoskeletal:        General: No deformity.     Cervical back: Neck supple.  Skin:     General: Skin is warm and dry.     Capillary Refill: Capillary refill takes less than 2 seconds.  Neurological:     Mental Status: He is alert and oriented to person, place, and time.     Coordination: Coordination normal.     Comments: Speech is clear, able to follow commands Moves extremities without ataxia, coordination intact  Psychiatric:        Mood and Affect: Mood normal.        Behavior: Behavior normal.     ED Results / Procedures / Treatments   Labs (all labs ordered are listed, but only abnormal results are displayed) Labs Reviewed  COMPREHENSIVE METABOLIC PANEL - Abnormal; Notable for the following components:      Result Value   Glucose, Bld 137 (*)    All other components within normal limits  URINALYSIS, ROUTINE W REFLEX MICROSCOPIC - Abnormal; Notable for the following components:   APPearance HAZY (*)    pH 9.0 (*)    All other components within normal limits  RESP PANEL BY RT-PCR (FLU A&B, COVID) ARPGX2  LIPASE, BLOOD  CBC WITH DIFFERENTIAL/PLATELET  HIV ANTIBODY (ROUTINE TESTING W REFLEX)    EKG [facility]  Radiology CT Abdomen Pelvis W Contrast  Result Date: 08/25/2020 CLINICAL DATA:  Postprandial abdominal pain, remote history of small-bowel obstruction and bariatric surgery EXAM: CT ABDOMEN AND PELVIS WITH CONTRAST TECHNIQUE: Multidetector CT imaging of the abdomen and pelvis was performed using the standard protocol following bolus administration of intravenous contrast. CONTRAST:  189mL OMNIPAQUE IOHEXOL 300 MG/ML  SOLN COMPARISON:  04/13/2020 FINDINGS: Lower chest: Small left diaphragmatic hernia with herniation of retroperitoneal fat into the thoracic cavity. The visualized lung bases are otherwise clear. The visualized heart and pericardium are unremarkable. Hepatobiliary: No focal liver abnormality is seen. No gallstones, gallbladder wall thickening, or biliary dilatation. Pancreas: Unremarkable Spleen: Unremarkable Adrenals/Urinary Tract: Indeterminate 23  mm nodule is seen within the left adrenal gland is stable from multiple remote prior examinations dating as far back as 74/04/1447, almost certainly representing a benign adrenal  adenoma. Right adrenal gland is unremarkable. The kidneys are normal in size and position. Simple cortical cyst noted within the left kidney. The kidneys are otherwise unremarkable. Bladder is unremarkable. Stomach/Bowel: Surgical changes of gastric sleeve resection and right hemicolectomy are identified. Large ventral hernia is present containing multiple loops of unremarkable large and small bowel. There is, however, a a distal small bowel obstruction present with multiple dilated fluid-filled loops of small bowel extending to a single point of transition as the bowel enters into the hernia sac, best seen on axial image # 50 and coronal image # 53. No free intraperitoneal gas or fluid. Vascular/Lymphatic: Mild atherosclerotic calcification within the abdominal aorta. No aortic aneurysm. The abdominal vasculature is otherwise unremarkable. No pathologic adenopathy. Reproductive: Prostate is unremarkable. Other: Rectum unremarkable Musculoskeletal: Degenerative changes are seen within the lumbar spine. No suspicious lytic or blastic bone lesions are seen. IMPRESSION: Small-bowel obstruction with single point of transition involving the mid to distal small bowel entering into the ventral hernia sac. Large ventral hernia containing multiple loops of large and small bowel including the ileocolic anastomosis. Status post gastric sleeve resection and right hemicolectomy. Aortic Atherosclerosis (ICD10-I70.0). Electronically Signed   By: Fidela Salisbury MD   On: 08/25/2020 03:22    Procedures Procedures (including critical care time)  Medications Ordered in ED Medications  sodium chloride (PF) 0.9 % injection (has no administration in time range)  lactated ringers infusion (has no administration in time range)  enoxaparin (LOVENOX)  injection 40 mg (has no administration in time range)  morphine 2 MG/ML injection 2-4 mg (has no administration in time range)  ondansetron (ZOFRAN) tablet 4 mg (has no administration in time range)    Or  ondansetron (ZOFRAN) injection 4 mg (has no administration in time range)  hydrALAZINE (APRESOLINE) injection 10-20 mg (has no administration in time range)  sodium chloride 0.9 % bolus 1,000 mL (0 mLs Intravenous Stopped 08/25/20 0423)  ondansetron (ZOFRAN) injection 4 mg (4 mg Intravenous Given 08/25/20 0153)  morphine 4 MG/ML injection 4 mg (4 mg Intravenous Given 08/25/20 0154)  iohexol (OMNIPAQUE) 300 MG/ML solution 100 mL (100 mLs Intravenous Contrast Given 08/25/20 0259)  morphine 4 MG/ML injection 4 mg (4 mg Intravenous Given 08/25/20 0434)    ED Course  I have reviewed the triage vital signs and the nursing notes.  Pertinent labs & imaging results that were available during my care of the patient were reviewed by me and considered in my medical decision making (see chart for details).    MDM Rules/Calculators/A&P                         Patient presents to the ED with complaints of abdominal pain. Patient nontoxic appearing, but appears very uncomfortable, bent over the side of the bed and pain.  Patient hypertensive but vitals otherwise normal.  On exam patient tender to palpation primarily in the periumbilical region, previous surgical scars noted, patient with known ventral hernia although this is difficult to appreciate externally, difficult to differentiate hernia from abdominal pannus, no peritoneal signs. Will evaluate with labs and CT abdomen pelvis. Analgesics, anti-emetics, and fluids administered.   Patient with prior history of small bowel obstruction, reports that this feels similar, also had gastric sleeve surgery last year with an attempted ventral hernia repair, unable to pass gas or bowel since pain began, extremely nauseous but not vomiting.  High clinical suspicion for  SBO.  I have independently ordered, reviewed and  interpreted all labs and imaging: CBC: No leukocytosis, normal hemoglobin CMP: Glucose of 137 but no other significant electrolyte derangements, normal renal and liver function Lipase: WNL UA: No hematuria or signs of infection  Imaging: Small bowel obstruction with single point of transition involving the mid to distal small bowel entering the ventral hernia sac, there is a large ventral hernia containing multiple loops of large and small bowel including the ileocolic anastomosis s/p gastric sleeve resection and right hemicolectomy.  On reevaluation patient reports pain initially was improving but now worsening again, will give additional dose of morphine.  Attempted to apply pressure over ventral hernia and incision to reduce, but because it is difficult to differentiate hernia from abdominal pannus, unable to tell if this was successful.  Patient still experiencing pain.  Consult placed to general surgery.  Case discussed with Dr. Georgette Dover who will have Dr. Hassell Done who is patient's surgeon see him in the morning, recommends medicine admission.  Hospitalist consult placed.  Case discussed with Dr. Jennette Kettle with [facility] hospitalist who will see and admit the patient.   Final Clinical Impression(s) / ED Diagnoses Final diagnoses:  SBO (small bowel obstruction) ([place])  Ventral hernia with obstruction and without gangrene    Rx / DC Orders ED Discharge Orders    [facility]       Janet Berlin 94/70/96 [id number]    Delora Fuel, MD 62/94/76 [phone]

## 2020-08-25 NOTE — Consult Note (Signed)
Consult Note  Cameron Jones 04/06/1966  [id number].    Requesting MD: Ghimire  Chief Complaint/Reason for Consult: ventral hernia with SBO HPI:  Patient is a 54 year old morbidly obese male who presented to [facility] with abdominal pain since yesterday evening after eating salad around 5-6 PM. Patient reports pain in epigastric abdomen that has progressively worsened in severity. Patient tried pepcid and home remedy at home without relief. No bowel function since onset of pain. Past abdominal surgery significant for prior colon resection for colon cancer, gastric band, removal of gastric band and gastric sleeve surgery last year, hernia repair. He reports weight loss since gastric sleeve. NKDA. PMH otherwise significant for HTN.   ROS: Review of Systems  Constitutional: Negative for chills and fever.  Respiratory: Negative for cough, shortness of breath and wheezing.   Cardiovascular: Negative for chest pain and palpitations.  Gastrointestinal: Positive for abdominal pain, constipation and nausea. Negative for blood in stool, diarrhea, melena and vomiting.  Genitourinary: Negative for dysuria, frequency and urgency.  All other systems reviewed and are negative.   Family History  Problem Relation Age of Onset  . Cancer Mother        pancreatic  . Cancer Brother        colon  . Alzheimer's disease Father   . Hypertension Sister   . Cancer Maternal Aunt        breast    Past Medical History:  Diagnosis Date  . Arthritis    Knee  . Cancer ([place])    colon  . Carpal tunnel syndrome of right wrist    resolved   . Fatty liver    per Dr Radford Pax note  . GERD (gastroesophageal reflux disease)   . Hernia   . History of blood transfusion   . Hypertension   . Lower back pain   . Obesity   . Sleep apnea    severe per report 12/11  on chart  . Ventral hernia     Past Surgical History:  Procedure Laterality Date  . BREATH TEK H PYLORI  11/07/2011   Procedure: BREATH TEK H  PYLORI;  Surgeon: Shann Medal, MD;  Location: [facility];  Service: General;  Laterality: N/A;  . BREATH TEK H PYLORI  01/02/2012   Procedure: BREATH TEK H PYLORI;  Surgeon: Pedro Earls, MD;  Location: [facility];  Service: General;  Laterality: N/A;  . COLON SURGERY  06/2006   colectomy  . COLONOSCOPY N/A 05/13/2013   Procedure: COLONOSCOPY;  Surgeon: Winfield Cunas., MD;  Location: [facility];  Service: Endoscopy;  Laterality: N/A;  . COLONOSCOPY N/A 08/30/2017   Procedure: COLONOSCOPY;  Surgeon: Laurence Spates, MD;  Location: [facility];  Service: Endoscopy;  Laterality: N/A;  . INGUINAL HERNIA REPAIR N/A 08/25/2019   Procedure: HERNIA REPAIR INCARCERATED;  Surgeon: Johnathan Hausen, MD;  Location: [facility];  Service: General;  Laterality: N/A;  . LAPAROSCOPIC GASTRIC BAND REMOVAL WITH LAPAROSCOPIC GASTRIC SLEEVE RESECTION N/A 08/25/2019   Procedure: LAPAROSCOPIC GASTRIC BAND REMOVAL WITH LAPAROSCOPIC GASTRIC SLEEVE RESECTION, Upper Endo, ERAS Pathway;  Surgeon: Johnathan Hausen, MD;  Location: [facility];  Service: General;  Laterality: N/A;  . LAPAROSCOPIC GASTRIC BANDING  05/07/2012   Procedure: LAPAROSCOPIC GASTRIC BANDING;  Surgeon: Pedro Earls, MD;  Location: [facility];  Service: General;  Laterality: N/A;  . LAPAROSCOPIC LYSIS OF ADHESIONS N/A 08/25/2019   Procedure: LAPAROSCOPIC LYSIS OF ADHESIONS;  Surgeon: Johnathan Hausen, MD;  Location: [facility];  Service: General;  Laterality: N/A;    Social History:  reports that he has never smoked. He has never used smokeless tobacco. He reports previous alcohol use of about 1.0 standard drink of alcohol per week. He reports that he does not use drugs.  Allergies: No Known Allergies  Medications Prior to Admission  Medication Sig Dispense Refill  . acetaminophen (TYLENOL) 500 MG tablet Take 500 mg by mouth every 6 (six) hours as needed for moderate pain or headache.    . calcium carbonate (OS-CAL) 1250 (500 Ca) MG chewable tablet  Chew 1 tablet by mouth daily.    . cholecalciferol (VITAMIN D3) 25 MCG (1000 UT) tablet Take 1,000 Units by mouth daily.    . ferrous sulfate 325 (65 FE) MG tablet Take 325 mg by mouth daily with breakfast.    . Multiple Vitamins-Minerals (MULTIVITAMIN WITH MINERALS) tablet Take 1 tablet by mouth daily.     . vitamin B-12 (CYANOCOBALAMIN) 1000 MCG tablet Take 1,000 mcg by mouth daily.      Blood pressure (!) 147/72, pulse 80, temperature 98.2 F (36.8 C), temperature source Oral, resp. rate 16, SpO2 100 %. Physical Exam:  General: pleasant, WD, obese male who is sitting up and writhing in pain  HEENT: head is normocephalic, atraumatic.  Sclera are noninjected.  PERRL.  Ears and nose without any masses or lesions.  Mouth is pink and moist Heart: regular, rate, and rhythm.  Normal s1,s2. No obvious murmurs, gallops, or rubs noted.  Palpable radial and pedal pulses bilaterally Lungs: CTAB, no wheezes, rhonchi, or rales noted.  Respiratory effort nonlabored Abd: soft, ttp in epigastric abdomen, ND, +BS, large ventral hernia  MS: all 4 extremities are symmetrical with no cyanosis, clubbing, or edema. Skin: warm and dry with no masses, lesions, or rashes Neuro: Cranial nerves 2-12 grossly intact, sensation is normal throughout Psych: A&Ox3 with an appropriate affect.    Results for orders placed or performed during the hospital encounter of 08/25/20 (from the past 48 hour(s))  Comprehensive metabolic panel     Status: Abnormal   Collection Time: 08/25/20  1:57 AM  Result Value Ref Range   Sodium 141 135 - 145 mmol/L   Potassium 3.7 3.5 - 5.1 mmol/L   Chloride 103 98 - 111 mmol/L   CO2 26 22 - 32 mmol/L   Glucose, Bld 137 (H) 70 - 99 mg/dL    Comment: Glucose reference range applies only to samples taken after fasting for at least 8 hours.   BUN 13 6 - 20 mg/dL   Creatinine, Ser 0.95 0.61 - 1.24 mg/dL   Calcium 9.4 8.9 - 10.3 mg/dL   Total Protein 7.3 6.5 - 8.1 g/dL   Albumin 4.5 3.5 -  5.0 g/dL   AST 28 15 - 41 U/L   ALT 34 0 - 44 U/L   Alkaline Phosphatase 42 38 - 126 U/L   Total Bilirubin 0.7 0.3 - 1.2 mg/dL   GFR, Estimated >60 >60 mL/min    Comment: (NOTE) Calculated using the CKD-EPI Creatinine Equation (2021)    Anion gap 12 5 - 15    Comment: Performed at [facility], [place] [address], [place], [place] [address]  Lipase, blood     Status: None   Collection Time: 08/25/20  1:57 AM  Result Value Ref Range   Lipase 25 11 - 51 U/L    Comment: Performed at [facility], [place] [address] [place], [place] [address]  CBC with Differential     Status: None   Collection Time: 08/25/20  1:57 AM  Result Value Ref Range   WBC 6.8 4.0 - 10.5 K/uL   RBC 5.47 4.22 - 5.81 MIL/uL   Hemoglobin 16.0 13.0 - 17.0 g/dL   HCT 49.3 39 - 52 %   MCV 90.1 80.0 - 100.0 fL   MCH 29.3 26.0 - 34.0 pg   MCHC 32.5 30.0 - 36.0 g/dL   RDW 13.9 11.5 - 15.5 %   Platelets 180 150 - 400 K/uL   nRBC 0.0 0.0 - 0.2 %   Neutrophils Relative % 68 %   Neutro Abs 4.6 1.7 - 7.7 K/uL   Lymphocytes Relative 23 %   Lymphs Abs 1.6 0.7 - 4.0 K/uL   Monocytes Relative 8 %   Monocytes Absolute 0.6 0.1 - 1.0 K/uL   Eosinophils Relative 1 %   Eosinophils Absolute 0.0 0.0 - 0.5 K/uL   Basophils Relative 0 %   Basophils Absolute 0.0 0.0 - 0.1 K/uL   Immature Granulocytes 0 %   Abs Immature Granulocytes 0.02 0.00 - 0.07 K/uL   Reactive, Benign Lymphocytes PRESENT     Comment: Performed at [facility], [place] [address] [place], [place] [address]  Resp Panel by RT-PCR (Flu A&B, Covid) Nasopharyngeal Swab     Status: None   Collection Time: 08/25/20  3:38 AM   Specimen: Nasopharyngeal Swab; Nasopharyngeal(NP) swabs in vial transport medium  Result Value Ref Range   SARS Coronavirus 2 by RT PCR NEGATIVE NEGATIVE    Comment: (NOTE) SARS-CoV-2 target nucleic acids are NOT DETECTED.  The SARS-CoV-2 RNA is generally detectable in upper  respiratory specimens during the acute phase of infection. The lowest concentration of SARS-CoV-2 viral copies this assay can detect is 138 copies/mL. A negative result does not preclude SARS-Cov-2 infection and should not be used as the sole basis for treatment or other patient management decisions. A negative result may occur with  improper specimen collection/handling, submission of specimen other than nasopharyngeal swab, presence of viral mutation(s) within the areas targeted by this assay, and inadequate number of viral copies(<138 copies/mL). A negative result must be combined with clinical observations, patient history, and epidemiological information. The expected result is Negative.  Fact Sheet for Patients:  EntrepreneurPulse.com.au  Fact Sheet for Healthcare Providers:  IncredibleEmployment.be  This test is no t yet approved or cleared by the [place] FDA and  has been authorized for detection and/or diagnosis of SARS-CoV-2 by FDA under an Emergency Use Authorization (EUA). This EUA will remain  in effect (meaning this test can be used) for the duration of the COVID-19 declaration under Section 564(b)(1) of the Act, 21 U.S.C.section 360bbb-3(b)(1), unless the authorization is terminated  or revoked sooner.       Influenza A by PCR NEGATIVE NEGATIVE   Influenza B by PCR NEGATIVE NEGATIVE    Comment: (NOTE) The Xpert Xpress SARS-CoV-2/FLU/RSV plus assay is intended as an aid in the diagnosis of influenza from Nasopharyngeal swab specimens and should not be used as a sole basis for treatment. Nasal washings and aspirates are unacceptable for Xpert Xpress SARS-CoV-2/FLU/RSV testing.  Fact Sheet for Patients: EntrepreneurPulse.com.au  Fact Sheet for Healthcare Providers: IncredibleEmployment.be  This test is not yet approved or cleared by the [place] FDA and has been authorized for  detection and/or diagnosis of SARS-CoV-2 by FDA under an Emergency Use Authorization (EUA). This EUA will remain in effect (meaning this test can  be used) for the duration of the COVID-19 declaration under Section 564(b)(1) of the Act, 21 U.S.C. section 360bbb-3(b)(1), unless the authorization is terminated or revoked.  Performed at [facility], [place] [address] [place], [place] [address]   Urinalysis, Routine w reflex microscopic     Status: Abnormal   Collection Time: 08/25/20  4:00 AM  Result Value Ref Range   Color, Urine YELLOW YELLOW   APPearance HAZY (A) CLEAR   Specific Gravity, Urine 1.016 1.005 - 1.030   pH 9.0 (H) 5.0 - 8.0   Glucose, UA NEGATIVE NEGATIVE mg/dL   Hgb urine dipstick NEGATIVE NEGATIVE   Bilirubin Urine NEGATIVE NEGATIVE   Ketones, ur NEGATIVE NEGATIVE mg/dL   Protein, ur NEGATIVE NEGATIVE mg/dL   Nitrite NEGATIVE NEGATIVE   Leukocytes,Ua NEGATIVE NEGATIVE    Comment: Performed at [place] [address] [place], [place] [address]   CT Abdomen Pelvis W Contrast  Result Date: 08/25/2020 CLINICAL DATA:  Postprandial abdominal pain, remote history of small-bowel obstruction and bariatric surgery EXAM: CT ABDOMEN AND PELVIS WITH CONTRAST TECHNIQUE: Multidetector CT imaging of the abdomen and pelvis was performed using the standard protocol following bolus administration of intravenous contrast. CONTRAST:  116mL OMNIPAQUE IOHEXOL 300 MG/ML  SOLN COMPARISON:  04/13/2020 FINDINGS: Lower chest: Small left diaphragmatic hernia with herniation of retroperitoneal fat into the thoracic cavity. The visualized lung bases are otherwise clear. The visualized heart and pericardium are unremarkable. Hepatobiliary: No focal liver abnormality is seen. No gallstones, gallbladder wall thickening, or biliary dilatation. Pancreas: Unremarkable Spleen: Unremarkable Adrenals/Urinary Tract: Indeterminate 23 mm nodule is seen within the left  adrenal gland is stable from multiple remote prior examinations dating as far back as 41/32/4401, almost certainly representing a benign adrenal adenoma. Right adrenal gland is unremarkable. The kidneys are normal in size and position. Simple cortical cyst noted within the left kidney. The kidneys are otherwise unremarkable. Bladder is unremarkable. Stomach/Bowel: Surgical changes of gastric sleeve resection and right hemicolectomy are identified. Large ventral hernia is present containing multiple loops of unremarkable large and small bowel. There is, however, a a distal small bowel obstruction present with multiple dilated fluid-filled loops of small bowel extending to a single point of transition as the bowel enters into the hernia sac, best seen on axial image # 50 and coronal image # 53. No free intraperitoneal gas or fluid. Vascular/Lymphatic: Mild atherosclerotic calcification within the abdominal aorta. No aortic aneurysm. The abdominal vasculature is otherwise unremarkable. No pathologic adenopathy. Reproductive: Prostate is unremarkable. Other: Rectum unremarkable Musculoskeletal: Degenerative changes are seen within the lumbar spine. No suspicious lytic or blastic bone lesions are seen. IMPRESSION: Small-bowel obstruction with single point of transition involving the mid to distal small bowel entering into the ventral hernia sac. Large ventral hernia containing multiple loops of large and small bowel including the ileocolic anastomosis. Status post gastric sleeve resection and right hemicolectomy. Aortic Atherosclerosis (ICD10-I70.0). Electronically Signed   By: Fidela Salisbury MD   On: 08/25/2020 03:22      Assessment/Plan HTN Morbid obesity   Hx of multiple past abdominal surgeries - most recent being gastric sleeve last year Ventral hernia  SBO - refrain from NGT insertion with gastric sleeve - PO gastrografin given around 10:30 AM for SBO protocol - follow up film 8h later - bowel rest  and mobilize as able - patient will likely eventually need some sort of hernia repair but defect is large and I do not suspect this is the  cause of SBO. SBO more likely related to adhesive disease from prior surgeries  FEN: NPO, IVF VTE: ok to chemical DVT prophylaxis from a surgical standpoint ID: no current abx   Norm Parcel, [facility] 08/25/2020, 11:31 AM Please see Amion for pager number during day hours 7:00am-4:30pm

## 2020-08-25 NOTE — ED Triage Notes (Signed)
Patient arrived stating tonight after eating a salad he began having abdominal pain. Tried OTC medication with no relief.

## 2020-08-25 NOTE — ED Notes (Signed)
NT to take pt up to 3E now

## 2020-08-25 NOTE — Progress Notes (Signed)
Patient seen and examined. Admitted early morning hours by nighttime hospitalist. He has history of colon cancer resection. Gastric sleeve surgery exactly 1 year ago. Presented with diarrhea followed by abdominal pain since Thanksgiving. He had nausea but no vomiting. In the emergency room he was found with abdominal pain, CT scan consistent with partial small bowel obstruction and known ventral hernia. Admitted for symptomatic treatment.  Partial small bowel obstruction: Clinically resolving. Gastrografin study by surgery. N.p.o., IV fluids, mobility. Surgery discussing about possibly correcting ventral hernia. We will continue to monitor.

## 2020-08-26 NOTE — Progress Notes (Signed)
Progress Note     Subjective: Patient up washing his face this AM, and reports pain significantly better. He reports he had a liquid BM this AM and is passing some flatus. He would like to be able to try to have hernia repaired prior to the end of the year.   Objective: Vital signs in last 24 hours: Temp:  [98.4 F (36.9 C)-99 F (37.2 C)] 98.7 F (37.1 C) (12/02 0527) Pulse Rate:  [61] 61 (12/02 0527) Resp:  [19-24] 19 (12/02 0527) BP: (137-159)/(69-78) 142/78 (12/02 0527) SpO2:  [95 %-100 %] 100 % (12/02 0527) Last BM Date: 08/24/20  Intake/Output from previous day: 12/01 0701 - 12/02 0700 In: 3100.7 [I.V.:3100.7] Out: 750 [Urine:750] Intake/Output this shift: No intake/output data recorded.  PE: General: pleasant, WD, obese male who is in NAD Heart: regular, rate, and rhythm. Lungs: CTAB, no wheezes, rhonchi, or rales noted.  Respiratory effort nonlabored Abd: soft, less ttp in epigastric abdomen, ND, +BS, large ventral hernia  MS: all 4 extremities are symmetrical with no cyanosis, clubbing, or edema. Skin: warm and dry with no masses, lesions, or rashes Neuro: Cranial nerves 2-12 grossly intact, sensation is normal throughout Psych: A&Ox3 with an appropriate affect.   Lab Results:  Recent Labs    08/25/20 0157  WBC 6.8  HGB 16.0  HCT 49.3  PLT 180   BMET Recent Labs    08/25/20 0157  NA 141  K 3.7  CL 103  CO2 26  GLUCOSE 137*  BUN 13  CREATININE 0.95  CALCIUM 9.4   PT/INR No results for input(s): LABPROT, INR in the last 72 hours. CMP     Component Value Date/Time   NA 141 08/25/2020 0157   K 3.7 08/25/2020 0157   CL 103 08/25/2020 0157   CO2 26 08/25/2020 0157   GLUCOSE 137 (H) 08/25/2020 0157   BUN 13 08/25/2020 0157   CREATININE 0.95 08/25/2020 0157   CALCIUM 9.4 08/25/2020 0157   PROT 7.3 08/25/2020 0157   ALBUMIN 4.5 08/25/2020 0157   AST 28 08/25/2020 0157   ALT 34 08/25/2020 0157   ALKPHOS 42 08/25/2020 0157   BILITOT 0.7  08/25/2020 0157   GFRNONAA >60 08/25/2020 0157   GFRAA >60 08/19/2019 0840   Lipase     Component Value Date/Time   LIPASE 25 08/25/2020 0157       Studies/Results: CT Abdomen Pelvis W Contrast  Result Date: 08/25/2020 CLINICAL DATA:  Postprandial abdominal pain, remote history of small-bowel obstruction and bariatric surgery EXAM: CT ABDOMEN AND PELVIS WITH CONTRAST TECHNIQUE: Multidetector CT imaging of the abdomen and pelvis was performed using the standard protocol following bolus administration of intravenous contrast. CONTRAST:  1107mL OMNIPAQUE IOHEXOL 300 MG/ML  SOLN COMPARISON:  04/13/2020 FINDINGS: Lower chest: Small left diaphragmatic hernia with herniation of retroperitoneal fat into the thoracic cavity. The visualized lung bases are otherwise clear. The visualized heart and pericardium are unremarkable. Hepatobiliary: No focal liver abnormality is seen. No gallstones, gallbladder wall thickening, or biliary dilatation. Pancreas: Unremarkable Spleen: Unremarkable Adrenals/Urinary Tract: Indeterminate 23 mm nodule is seen within the left adrenal gland is stable from multiple remote prior examinations dating as far back as 03/20/9484, almost certainly representing a benign adrenal adenoma. Right adrenal gland is unremarkable. The kidneys are normal in size and position. Simple cortical cyst noted within the left kidney. The kidneys are otherwise unremarkable. Bladder is unremarkable. Stomach/Bowel: Surgical changes of gastric sleeve resection and right hemicolectomy are identified. Large ventral hernia  is present containing multiple loops of unremarkable large and small bowel. There is, however, a a distal small bowel obstruction present with multiple dilated fluid-filled loops of small bowel extending to a single point of transition as the bowel enters into the hernia sac, best seen on axial image # 50 and coronal image # 53. No free intraperitoneal gas or fluid. Vascular/Lymphatic: Mild  atherosclerotic calcification within the abdominal aorta. No aortic aneurysm. The abdominal vasculature is otherwise unremarkable. No pathologic adenopathy. Reproductive: Prostate is unremarkable. Other: Rectum unremarkable Musculoskeletal: Degenerative changes are seen within the lumbar spine. No suspicious lytic or blastic bone lesions are seen. IMPRESSION: Small-bowel obstruction with single point of transition involving the mid to distal small bowel entering into the ventral hernia sac. Large ventral hernia containing multiple loops of large and small bowel including the ileocolic anastomosis. Status post gastric sleeve resection and right hemicolectomy. Aortic Atherosclerosis (ICD10-I70.0). Electronically Signed   By: Fidela Salisbury MD   On: 08/25/2020 03:22   DG Abd Portable 1V-Small Bowel Obstruction Protocol-initial, 8 hr delay  Result Date: 08/25/2020 CLINICAL DATA:  54 year old male with 8 hour delay small-bowel protocol. EXAM: PORTABLE ABDOMEN - 1 VIEW COMPARISON:  CT abdomen pelvis dated 08/25/2020. FINDINGS: Oral contrast noted in a dilated loop of small bowel in the right lower quadrant. There is contrast in the transverse colon. IMPRESSION: Persistent dilatation of small bowel with contrast in the transverse colon. Findings likely represent a partial obstruction. Clinical correlation and follow-up recommended. Electronically Signed   By: Anner Crete M.D.   On: 08/25/2020 18:51    Anti-infectives: Anti-infectives (From admission, onward)   None       Assessment/Plan HTN Morbid obesity   Hx of multiple past abdominal surgeries - most recent being gastric sleeve last year Ventral hernia  SBO - refrain from NGT insertion with gastric sleeve - 8h film with contrast in transverse colon and patient reports BM today - ok to start CLD - BMET pending  - patient will likely eventually need some sort of hernia repair but defect is large and I do not suspect this is the cause of SBO.  SBO more likely related to adhesive disease from prior surgeries  FEN: CLD, IVF VTE: lovenox ID: no current abx  LOS: 1 day    Norm Parcel , [facility] 08/26/2020, 8:22 AM Please see Amion for pager number during day hours 7:00am-4:30pm

## 2020-08-26 NOTE — Progress Notes (Signed)
PROGRESS NOTE    [name]  JJO:[id number] DOB: [birth year] DOA: 08/25/2020 PCP: Haywood Pao, MD    Brief Narrative:  54 year old gentleman with history of colon cancer status post resection, gastric sleeve about a year ago, ventral hernia presented to the emergency room with abdominal pain, distention after eating some salad and Thanksgiving.  He was found to have partial small bowel obstruction so admitted to the hospital and followed by surgery.   Assessment & Plan:   Principal Problem:   SBO (small bowel obstruction) (HCC) Active Problems:   Ventral hernia-midline   Essential hypertension   Ventral hernia with obstruction and without gangrene  Partial small bowel obstruction: Some clinical recovery today.  As per surgery Sips of water, clears, mobility, IV fluid, IV antibiotic Plan for ultimate repair of ventral hernia possibly elective. Recheck electrolytes and magnesium tomorrow morning.  Hypertension: Has history of hypertension but not on treatment.  Monitor and currently not requiring any treatment.   DVT prophylaxis: enoxaparin (LOVENOX) injection 40 mg Start: 08/25/20 1000   Code Status: Full code Family Communication: None Disposition Plan: Status is: Inpatient  Remains inpatient appropriate because:Inpatient level of care appropriate due to severity of illness   Dispo: The patient is from: Home              Anticipated d/c is to: Home              Anticipated d/c date is: 2 days              Patient currently is not medically stable to d/c.         Consultants:   Surgery  Procedures:   None  Antimicrobials:   None   Subjective: Patient seen and examined.  He was walking in the hallway.  Had a small bowel movement today morning.  Most of the abdominal distention has improved but he still has some dull ache.  Denies any nausea vomiting.  Taking sips of water.  Objective: Vitals:   08/25/20 0815 08/25/20 1416 08/25/20 2116  08/26/20 0527  BP: (!) 147/72 (!) 159/75 137/69 (!) 142/78  Pulse: 80 61 61 61  Resp: 16 (!) 24 20 19   Temp: 98.2 F (36.8 C) 99 F (37.2 C) 98.4 F (36.9 C) 98.7 F (37.1 C)  TempSrc: Oral Oral Oral Oral  SpO2: 100% 95% 99% 100%    Intake/Output Summary (Last 24 hours) at 08/26/2020 1136 Last data filed at 08/26/2020 1000 Gross per 24 hour  Intake 3340.73 ml  Output 950 ml  Net 2390.73 ml   There were no vitals filed for this visit.  Examination:  General exam: Appears calm and comfortable He was walking in the hallway.  Looks comfortable. Respiratory system: Clear to auscultation. Respiratory effort normal. Cardiovascular system: S1 & S2 heard, RRR.  Gastrointestinal system: Distended but nontender.  Bowel sounds present.  Midline surgical scar nontender.   Central nervous system: Alert and oriented. No focal neurological deficits. Extremities: Symmetric 5 x 5 power.    Data Reviewed: I have personally reviewed following labs and imaging studies  CBC: Recent Labs  Lab 08/25/20 0157  WBC 6.8  NEUTROABS 4.6  HGB 16.0  HCT 49.3  MCV 90.1  PLT 160   Basic Metabolic Panel: Recent Labs  Lab 08/25/20 0157  NA 141  K 3.7  CL 103  CO2 26  GLUCOSE 137*  BUN 13  CREATININE 0.95  CALCIUM 9.4   GFR: CrCl cannot be calculated (Unknown ideal  weight.). Liver Function Tests: Recent Labs  Lab 08/25/20 0157  AST 28  ALT 34  ALKPHOS 42  BILITOT 0.7  PROT 7.3  ALBUMIN 4.5   Recent Labs  Lab 08/25/20 0157  LIPASE 25   No results for input(s): AMMONIA in the last 168 hours. Coagulation Profile: No results for input(s): INR, PROTIME in the last 168 hours. Cardiac Enzymes: No results for input(s): CKTOTAL, CKMB, CKMBINDEX, TROPONINI in the last 168 hours. BNP (last 3 results) No results for input(s): PROBNP in the last 8760 hours. HbA1C: No results for input(s): HGBA1C in the last 72 hours. CBG: No results for input(s): GLUCAP in the last 168  hours. Lipid Profile: No results for input(s): CHOL, HDL, LDLCALC, TRIG, CHOLHDL, LDLDIRECT in the last 72 hours. Thyroid Function Tests: No results for input(s): TSH, T4TOTAL, FREET4, T3FREE, THYROIDAB in the last 72 hours. Anemia Panel: No results for input(s): VITAMINB12, FOLATE, FERRITIN, TIBC, IRON, RETICCTPCT in the last 72 hours. Sepsis Labs: No results for input(s): PROCALCITON, LATICACIDVEN in the last 168 hours.  Recent Results (from the past 240 hour(s))  Resp Panel by RT-PCR (Flu A&B, Covid) Nasopharyngeal Swab     Status: None   Collection Time: 08/25/20  3:38 AM   Specimen: Nasopharyngeal Swab; Nasopharyngeal(NP) swabs in vial transport medium  Result Value Ref Range Status   SARS Coronavirus 2 by RT PCR NEGATIVE NEGATIVE Final    Comment: (NOTE) SARS-CoV-2 target nucleic acids are NOT DETECTED.  The SARS-CoV-2 RNA is generally detectable in upper respiratory specimens during the acute phase of infection. The lowest concentration of SARS-CoV-2 viral copies this assay can detect is 138 copies/mL. A negative result does not preclude SARS-Cov-2 infection and should not be used as the sole basis for treatment or other patient management decisions. A negative result may occur with  improper specimen collection/handling, submission of specimen other than nasopharyngeal swab, presence of viral mutation(s) within the areas targeted by this assay, and inadequate number of viral copies(<138 copies/mL). A negative result must be combined with clinical observations, patient history, and epidemiological information. The expected result is Negative.  Fact Sheet for Patients:  EntrepreneurPulse.com.au  Fact Sheet for Healthcare Providers:  IncredibleEmployment.be  This test is no t yet approved or cleared by the [place] FDA and  has been authorized for detection and/or diagnosis of SARS-CoV-2 by FDA under an Emergency Use Authorization  (EUA). This EUA will remain  in effect (meaning this test can be used) for the duration of the COVID-19 declaration under Section 564(b)(1) of the Act, 21 U.S.C.section 360bbb-3(b)(1), unless the authorization is terminated  or revoked sooner.       Influenza A by PCR NEGATIVE NEGATIVE Final   Influenza B by PCR NEGATIVE NEGATIVE Final    Comment: (NOTE) The Xpert Xpress SARS-CoV-2/FLU/RSV plus assay is intended as an aid in the diagnosis of influenza from Nasopharyngeal swab specimens and should not be used as a sole basis for treatment. Nasal washings and aspirates are unacceptable for Xpert Xpress SARS-CoV-2/FLU/RSV testing.  Fact Sheet for Patients: EntrepreneurPulse.com.au  Fact Sheet for Healthcare Providers: IncredibleEmployment.be  This test is not yet approved or cleared by the [place] FDA and has been authorized for detection and/or diagnosis of SARS-CoV-2 by FDA under an Emergency Use Authorization (EUA). This EUA will remain in effect (meaning this test can be used) for the duration of the COVID-19 declaration under Section 564(b)(1) of the Act, 21 U.S.C. section 360bbb-3(b)(1), unless the authorization is terminated or revoked.  Performed at [facility], [place] [address] [place], [place] [address]          Radiology Studies: CT Abdomen Pelvis W Contrast  Result Date: 08/25/2020 CLINICAL DATA:  Postprandial abdominal pain, remote history of small-bowel obstruction and bariatric surgery EXAM: CT ABDOMEN AND PELVIS WITH CONTRAST TECHNIQUE: Multidetector CT imaging of the abdomen and pelvis was performed using the standard protocol following bolus administration of intravenous contrast. CONTRAST:  133mL OMNIPAQUE IOHEXOL 300 MG/ML  SOLN COMPARISON:  04/13/2020 FINDINGS: Lower chest: Small left diaphragmatic hernia with herniation of retroperitoneal fat into the thoracic cavity. The visualized lung bases  are otherwise clear. The visualized heart and pericardium are unremarkable. Hepatobiliary: No focal liver abnormality is seen. No gallstones, gallbladder wall thickening, or biliary dilatation. Pancreas: Unremarkable Spleen: Unremarkable Adrenals/Urinary Tract: Indeterminate 23 mm nodule is seen within the left adrenal gland is stable from multiple remote prior examinations dating as far back as 32/95/1884, almost certainly representing a benign adrenal adenoma. Right adrenal gland is unremarkable. The kidneys are normal in size and position. Simple cortical cyst noted within the left kidney. The kidneys are otherwise unremarkable. Bladder is unremarkable. Stomach/Bowel: Surgical changes of gastric sleeve resection and right hemicolectomy are identified. Large ventral hernia is present containing multiple loops of unremarkable large and small bowel. There is, however, a a distal small bowel obstruction present with multiple dilated fluid-filled loops of small bowel extending to a single point of transition as the bowel enters into the hernia sac, best seen on axial image # 50 and coronal image # 53. No free intraperitoneal gas or fluid. Vascular/Lymphatic: Mild atherosclerotic calcification within the abdominal aorta. No aortic aneurysm. The abdominal vasculature is otherwise unremarkable. No pathologic adenopathy. Reproductive: Prostate is unremarkable. Other: Rectum unremarkable Musculoskeletal: Degenerative changes are seen within the lumbar spine. No suspicious lytic or blastic bone lesions are seen. IMPRESSION: Small-bowel obstruction with single point of transition involving the mid to distal small bowel entering into the ventral hernia sac. Large ventral hernia containing multiple loops of large and small bowel including the ileocolic anastomosis. Status post gastric sleeve resection and right hemicolectomy. Aortic Atherosclerosis (ICD10-I70.0). Electronically Signed   By: Fidela Salisbury MD   On: 08/25/2020  03:22   DG Abd Portable 1V-Small Bowel Obstruction Protocol-initial, 8 hr delay  Result Date: 08/25/2020 CLINICAL DATA:  54 year old male with 8 hour delay small-bowel protocol. EXAM: PORTABLE ABDOMEN - 1 VIEW COMPARISON:  CT abdomen pelvis dated 08/25/2020. FINDINGS: Oral contrast noted in a dilated loop of small bowel in the right lower quadrant. There is contrast in the transverse colon. IMPRESSION: Persistent dilatation of small bowel with contrast in the transverse colon. Findings likely represent a partial obstruction. Clinical correlation and follow-up recommended. Electronically Signed   By: Anner Crete M.D.   On: 08/25/2020 18:51        Scheduled Meds: . enoxaparin (LOVENOX) injection  40 mg Subcutaneous Q24H   Continuous Infusions: . lactated ringers 140 mL/hr at 08/25/20 2100     LOS: 1 day    Time spent: 25 minutes    Barb Merino, MD [facility] Pager [phone]

## 2020-08-27 LAB — BASIC METABOLIC PANEL
Anion gap: 10 (ref 5–15)
BUN: 8 mg/dL (ref 6–20)
CO2: 25 mmol/L (ref 22–32)
Calcium: 8.8 mg/dL — ABNORMAL LOW (ref 8.9–10.3)
Chloride: 105 mmol/L (ref 98–111)
Creatinine, Ser: 1.01 mg/dL (ref 0.61–1.24)
GFR, Estimated: >60 mL/min (ref >60)
Glucose, Bld: 91 mg/dL (ref 70–99)
Potassium: 3.8 mmol/L (ref 3.5–5.1)
Sodium: 140 mmol/L (ref 135–145)

## 2020-08-27 LAB — PHOSPHORUS: Phosphorus: 3.3 mg/dL (ref 2.5–4.6)

## 2020-08-27 LAB — MAGNESIUM: Magnesium: 1.9 mg/dL (ref 1.7–2.4)

## 2020-08-27 MED ORDER — POTASSIUM CHLORIDE CRYS ER 20 MEQ PO TBCR
20.0000 meq | EXTENDED_RELEASE_TABLET | Freq: Two times a day (BID) | ORAL | Status: AC
  Administered 2020-08-27 (×2): 20 meq via ORAL
  Filled 2020-08-27 (×2): qty 1

## 2020-08-27 MED ORDER — TRAMADOL HCL 50 MG PO TABS
50.0000 mg | ORAL_TABLET | Freq: Four times a day (QID) | ORAL | Status: DC | PRN
  Administered 2020-08-27: 50 mg via ORAL
  Filled 2020-08-27: qty 1

## 2020-08-27 MED ORDER — ACETAMINOPHEN 325 MG PO TABS: 650.0000 mg | ORAL_TABLET | Freq: Four times a day (QID) | ORAL | Status: DC | PRN

## 2020-08-27 MED ORDER — ALUM & MAG HYDROXIDE-SIMETH 200-200-20 MG/5ML PO SUSP
30.0000 mL | Freq: Four times a day (QID) | ORAL | Status: DC | PRN
  Administered 2020-08-27: 30 mL via ORAL
  Filled 2020-08-27: qty 30

## 2020-08-27 MED ORDER — ENSURE ENLIVE PO LIQD
237.0000 mL | Freq: Two times a day (BID) | ORAL | Status: DC
  Administered 2020-08-27 – 2020-08-28 (×2): 237 mL via ORAL

## 2020-08-27 NOTE — Progress Notes (Signed)
Progress Note     Subjective: Patient reports some soreness in abdomen but no overt pain. Tolerating CLD and having bowel function. BMs are loose. Denies nausea.   Objective: Vital signs in last 24 hours: Temp:  [98.3 F (36.8 C)-98.8 F (37.1 C)] 98.3 F (36.8 C) (12/03 0527) Pulse Rate:  [54-70] 70 (12/03 0527) Resp:  [16-18] 18 (12/03 0527) BP: (138-156)/(71-83) 156/71 (12/03 0527) SpO2:  [96 %-100 %] 96 % (12/03 0527) Weight:  [137.4 kg] 137.4 kg (12/03 0527) Last BM Date: 08/26/20  Intake/Output from previous day: 12/02 0701 - 12/03 0700 In: 1951.8 [P.O.:840; I.V.:1111.8] Out: 1550 [[id number]] Intake/Output this shift: No intake/output data recorded.  PE: General: pleasant, WD,obesemale who is in NAD Heart: regular, rate, and rhythm. Lungs: CTAB, no wheezes, rhonchi, or rales noted. Respiratory effort nonlabored Abd: soft, NT, ND, +BS, large ventral hernia MS: all 4 extremities are symmetrical with no cyanosis, clubbing, or edema. Skin: warm and dry with no masses, lesions, or rashes Neuro: Cranial nerves 2-12 grossly intact, sensation is normal throughout Psych: A&Ox3 with an appropriate affect.   Lab Results:  Recent Labs    08/25/20 0157  WBC 6.8  HGB 16.0  HCT 49.3  PLT 180   BMET Recent Labs    08/25/20 0157 08/27/20 0520  NA 141 140  K 3.7 3.8  CL 103 105  CO2 26 25  GLUCOSE 137* 91  BUN 13 8  CREATININE 0.95 1.01  CALCIUM 9.4 8.8*   PT/INR No results for input(s): LABPROT, INR in the last 72 hours. CMP     Component Value Date/Time   NA 140 08/27/2020 0520   K 3.8 08/27/2020 0520   CL 105 08/27/2020 0520   CO2 25 08/27/2020 0520   GLUCOSE 91 08/27/2020 0520   BUN 8 08/27/2020 0520   CREATININE 1.01 08/27/2020 0520   CALCIUM 8.8 (L) 08/27/2020 0520   PROT 7.3 08/25/2020 0157   ALBUMIN 4.5 08/25/2020 0157   AST 28 08/25/2020 0157   ALT 34 08/25/2020 0157   ALKPHOS 42 08/25/2020 0157   BILITOT 0.7 08/25/2020 0157    GFRNONAA >60 08/27/2020 0520   GFRAA >60 08/19/2019 0840   Lipase     Component Value Date/Time   LIPASE 25 08/25/2020 0157       Studies/Results: DG Abd Portable 1V-Small Bowel Obstruction Protocol-initial, 8 hr delay  Result Date: 08/25/2020 CLINICAL DATA:  54 year old male with 8 hour delay small-bowel protocol. EXAM: PORTABLE ABDOMEN - 1 VIEW COMPARISON:  CT abdomen pelvis dated 08/25/2020. FINDINGS: Oral contrast noted in a dilated loop of small bowel in the right lower quadrant. There is contrast in the transverse colon. IMPRESSION: Persistent dilatation of small bowel with contrast in the transverse colon. Findings likely represent a partial obstruction. Clinical correlation and follow-up recommended. Electronically Signed   By: Anner Crete M.D.   On: 08/25/2020 18:51    Anti-infectives: Anti-infectives (From admission, onward)   None       Assessment/Plan HTN Morbid obesity   Hx of multiple past abdominal surgeries - most recent being gastric sleeve last year Ventral hernia  SBO - refrain from NGT insertion with gastric sleeve - 8h film with contrast in transverse colon and patient continues to have bowel function  - advance to FLD with ensure - pt may be able to go home later today vs tomorrow on FLD if tolerating, will discuss with MD - patient will likely eventually need some sort of hernia repair but defect  is large and I do not suspect this is the cause of SBO. SBO more likely related to adhesive disease from prior surgeries  FEN: CLD, IVF VTE: lovenox ID: no current abx Follow up: Dr. Rosendo Gros   LOS: 2 days    Norm Parcel , [facility] Surgery 08/27/2020, 7:58 AM Please see Amion for pager number during day hours 7:00am-4:30pm

## 2020-08-27 NOTE — Progress Notes (Signed)
PROGRESS NOTE    Cameron Jones  XKG:[id number] DOB: 10/11/1965 DOA: 08/25/2020 PCP: Haywood Pao, MD    Brief Narrative:  54 year old gentleman with history of colon cancer status post resection, gastric sleeve about a year ago, ventral hernia presented to the emergency room with abdominal pain, distention after eating some salad and Thanksgiving.  He was found to have partial small bowel obstruction so admitted to the hospital and followed by surgery.   Assessment & Plan:   Principal Problem:   SBO (small bowel obstruction) (HCC) Active Problems:   Ventral hernia-midline   Essential hypertension   Ventral hernia with obstruction and without gangrene  Partial small bowel obstruction: Some clinical recovery today.  Advancing diet today. Plan for ultimate repair of ventral hernia possibly elective. Electrolytes are adequate.  Hypertension: Has history of hypertension but not on treatment.  Monitor and currently not requiring any treatment.  Patient clinically improving.  Discharge whenever surgically stable.  DVT prophylaxis: enoxaparin (LOVENOX) injection 40 mg Start: 08/25/20 1000   Code Status: Full code Family Communication: None Disposition Plan: Status is: Inpatient  Remains inpatient appropriate because:Inpatient level of care appropriate due to severity of illness   Dispo: The patient is from: Home              Anticipated d/c is to: Home              Anticipated d/c date is: Unknown.              Patient currently medically stable.  Waiting for surgical clearance.         Consultants:   Surgery  Procedures:   None  Antimicrobials:   None   Subjective: Patient was seen and examined.  No overnight events.  He had full liquid diet and ambulated around.  He had 2 bowel movements after eating his food today.  Felt slightly distended and belly cramp.  Objective: Vitals:   08/26/20 1327 08/26/20 2118 08/27/20 0527 08/27/20 1337  BP: 138/83  (!) 154/71 (!) 156/71 (!) 159/85  Pulse: (!) 54 62 70 (!) 57  Resp: 16 18 18 15   Temp: 98.8 F (37.1 C) 98.6 F (37 C) 98.3 F (36.8 C) 98.4 F (36.9 C)  TempSrc: Oral   Oral  SpO2: 100% 98% 96% 98%  Weight:   (!) 137.4 kg   Height:   6' (1.829 m)     Intake/Output Summary (Last 24 hours) at 08/27/2020 1420 Last data filed at 08/27/2020 1341 Gross per 24 hour  Intake 960 ml  Output 1200 ml  Net -240 ml   Filed Weights   08/27/20 0527  Weight: (!) 137.4 kg    Examination:  General exam: Appears calm and comfortable He was walking in the hallway.  Looks comfortable. Respiratory system: Clear to auscultation. Respiratory effort normal. Cardiovascular system: S1 & S2 heard, RRR.  Gastrointestinal system: Distended but nontender.  Bowel sounds present.  Midline surgical scar nontender.   Central nervous system: Alert and oriented. No focal neurological deficits. Extremities: Symmetric 5 x 5 power.    Data Reviewed: I have personally reviewed following labs and imaging studies  CBC: Recent Labs  Lab 08/25/20 0157  WBC 6.8  NEUTROABS 4.6  HGB 16.0  HCT 49.3  MCV 90.1  PLT 702   Basic Metabolic Panel: Recent Labs  Lab 08/25/20 0157 08/27/20 0520  NA 141 140  K 3.7 3.8  CL 103 105  CO2 26 25  GLUCOSE 137* 91  BUN 13 8  CREATININE 0.95 1.01  CALCIUM 9.4 8.8*  MG  --  1.9  PHOS  --  3.3   GFR: Estimated Creatinine Clearance: 122.8 mL/min (by C-G formula based on SCr of 1.01 mg/dL). Liver Function Tests: Recent Labs  Lab 08/25/20 0157  AST 28  ALT 34  ALKPHOS 42  BILITOT 0.7  PROT 7.3  ALBUMIN 4.5   Recent Labs  Lab 08/25/20 0157  LIPASE 25   No results for input(s): AMMONIA in the last 168 hours. Coagulation Profile: No results for input(s): INR, PROTIME in the last 168 hours. Cardiac Enzymes: No results for input(s): CKTOTAL, CKMB, CKMBINDEX, TROPONINI in the last 168 hours. BNP (last 3 results) No results for input(s): PROBNP in the last  8760 hours. HbA1C: No results for input(s): HGBA1C in the last 72 hours. CBG: No results for input(s): GLUCAP in the last 168 hours. Lipid Profile: No results for input(s): CHOL, HDL, LDLCALC, TRIG, CHOLHDL, LDLDIRECT in the last 72 hours. Thyroid Function Tests: No results for input(s): TSH, T4TOTAL, FREET4, T3FREE, THYROIDAB in the last 72 hours. Anemia Panel: No results for input(s): VITAMINB12, FOLATE, FERRITIN, TIBC, IRON, RETICCTPCT in the last 72 hours. Sepsis Labs: No results for input(s): PROCALCITON, LATICACIDVEN in the last 168 hours.  Recent Results (from the past 240 hour(s))  Resp Panel by RT-PCR (Flu A&B, Covid) Nasopharyngeal Swab     Status: None   Collection Time: 08/25/20  3:38 AM   Specimen: Nasopharyngeal Swab; Nasopharyngeal(NP) swabs in vial transport medium  Result Value Ref Range Status   SARS Coronavirus 2 by RT PCR NEGATIVE NEGATIVE Final    Comment: (NOTE) SARS-CoV-2 target nucleic acids are NOT DETECTED.  The SARS-CoV-2 RNA is generally detectable in upper respiratory specimens during the acute phase of infection. The lowest concentration of SARS-CoV-2 viral copies this assay can detect is 138 copies/mL. A negative result does not preclude SARS-Cov-2 infection and should not be used as the sole basis for treatment or other patient management decisions. A negative result may occur with  improper specimen collection/handling, submission of specimen other than nasopharyngeal swab, presence of viral mutation(s) within the areas targeted by this assay, and inadequate number of viral copies(<138 copies/mL). A negative result must be combined with clinical observations, patient history, and epidemiological information. The expected result is Negative.  Fact Sheet for Patients:  EntrepreneurPulse.com.au  Fact Sheet for Healthcare Providers:  IncredibleEmployment.be  This test is no t yet approved or cleared by the  [place] FDA and  has been authorized for detection and/or diagnosis of SARS-CoV-2 by FDA under an Emergency Use Authorization (EUA). This EUA will remain  in effect (meaning this test can be used) for the duration of the COVID-19 declaration under Section 564(b)(1) of the Act, 21 U.S.C.section 360bbb-3(b)(1), unless the authorization is terminated  or revoked sooner.       Influenza A by PCR NEGATIVE NEGATIVE Final   Influenza B by PCR NEGATIVE NEGATIVE Final    Comment: (NOTE) The Xpert Xpress SARS-CoV-2/FLU/RSV plus assay is intended as an aid in the diagnosis of influenza from Nasopharyngeal swab specimens and should not be used as a sole basis for treatment. Nasal washings and aspirates are unacceptable for Xpert Xpress SARS-CoV-2/FLU/RSV testing.  Fact Sheet for Patients: EntrepreneurPulse.com.au  Fact Sheet for Healthcare Providers: IncredibleEmployment.be  This test is not yet approved or cleared by the [place] FDA and has been authorized for detection and/or diagnosis of SARS-CoV-2 by FDA under an Emergency Use  Authorization (EUA). This EUA will remain in effect (meaning this test can be used) for the duration of the COVID-19 declaration under Section 564(b)(1) of the Act, 21 U.S.C. section 360bbb-3(b)(1), unless the authorization is terminated or revoked.  Performed at [facility], [place] [address], [place], [place] [address]          Radiology Studies: DG Abd Portable 1V-Small Bowel Obstruction Protocol-initial, 8 hr delay  Result Date: 08/25/2020 CLINICAL DATA:  54 year old male with 8 hour delay small-bowel protocol. EXAM: PORTABLE ABDOMEN - 1 VIEW COMPARISON:  CT abdomen pelvis dated 08/25/2020. FINDINGS: Oral contrast noted in a dilated loop of small bowel in the right lower quadrant. There is contrast in the transverse colon. IMPRESSION: Persistent dilatation of small bowel with contrast in  the transverse colon. Findings likely represent a partial obstruction. Clinical correlation and follow-up recommended. Electronically Signed   By: Anner Crete M.D.   On: 08/25/2020 18:51        Scheduled Meds: . enoxaparin (LOVENOX) injection  40 mg Subcutaneous Q24H  . feeding supplement  237 mL Oral BID BM  . potassium chloride  20 mEq Oral BID   Continuous Infusions: . lactated ringers 50 mL/hr at 08/27/20 0834     LOS: 2 days    Time spent: 20 minutes    Barb Merino, MD [facility] Pager [phone]

## 2020-08-28 NOTE — Progress Notes (Signed)
     Assessment & Plan: Hx of multiple past abdominal surgeries - most recent being gastric sleeve last year Ventral hernia  SBO  Tolerating full liquid diet overnight, having flatus and BM's  Denies abdominal pain  OK for discharge home from surgical standpoint  Follow up appt on Wednesday 12/8 with Dr. Hassell Done at [facility] to discuss hernia repair        Cameron Gemma, MD       [facility]       Office: [phone]   Chief Complaint: SBO, ventral hernia  Subjective: Patient in bed, comfortable.  Tolerating FLD.  Hoping to go home today.  Objective: Vital signs in last 24 hours: Temp:  [97.9 F (36.6 C)-98.4 F (36.9 C)] 98 F (36.7 C) (12/04 0518) Pulse Rate:  [57-59] 59 (12/04 0518) Resp:  [15-20] 16 (12/04 0518) BP: (134-159)/(72-86) 136/86 (12/04 0518) SpO2:  [98 %] 98 % (12/04 0518) Last BM Date: 08/26/20  Intake/Output from previous day: 12/03 0701 - 12/04 0700 In: 960 [P.O.:960] Out: -  Intake/Output this shift: No intake/output data recorded.  Physical Exam: HEENT - sclerae clear, mucous membranes moist Neck - soft Abdomen - soft, non-tender; BS present Neuro - alert & oriented, no focal deficits  Lab Results:  No results for input(s): WBC, HGB, HCT, PLT in the last 72 hours. BMET Recent Labs    08/27/20 0520  NA 140  K 3.8  CL 105  CO2 25  GLUCOSE 91  BUN 8  CREATININE 1.01  CALCIUM 8.8*   PT/INR No results for input(s): LABPROT, INR in the last 72 hours. Comprehensive Metabolic Panel:    Component Value Date/Time   NA 140 08/27/2020 0520   NA 141 08/25/2020 0157   K 3.8 08/27/2020 0520   K 3.7 08/25/2020 0157   CL 105 08/27/2020 0520   CL 103 08/25/2020 0157   CO2 25 08/27/2020 0520   CO2 26 08/25/2020 0157   BUN 8 08/27/2020 0520   BUN 13 08/25/2020 0157   CREATININE 1.01 08/27/2020 0520   CREATININE 0.95 08/25/2020 0157   GLUCOSE 91 08/27/2020 0520   GLUCOSE 137 (H) 08/25/2020 0157   CALCIUM 8.8 (L)  08/27/2020 0520   CALCIUM 9.4 08/25/2020 0157   AST 28 08/25/2020 0157   AST 48 (H) 08/19/2019 0840   ALT 34 08/25/2020 0157   ALT 63 (H) 08/19/2019 0840   ALKPHOS 42 08/25/2020 0157   ALKPHOS 41 08/19/2019 0840   BILITOT 0.7 08/25/2020 0157   BILITOT 0.8 08/19/2019 0840   PROT 7.3 08/25/2020 0157   PROT 7.1 08/19/2019 0840   ALBUMIN 4.5 08/25/2020 0157   ALBUMIN 4.6 08/19/2019 0840    Studies/Results: No results found.    Cameron Jones 08/28/2020  Patient ID: Cameron Jones, male   DOB: Apr 17, 1966, 54 y.o.   MRN: [id number]

## 2020-08-28 NOTE — Discharge Summary (Signed)
Physician Discharge Summary  Cameron Jones ZOX:[id number] DOB: May 11, 1966 DOA: 08/25/2020  PCP: Haywood Pao, MD  Admit date: 08/25/2020 Discharge date: 08/28/2020  Admitted From: home  Disposition:  Home   Recommendations for Outpatient Follow-up:  1. Follow up with PCP in 1-2 weeks 2. Follow-up with surgery next week as scheduled.  Home Health: Not applicable Equipment/Devices: Not applicable  Discharge Condition: Stable CODE STATUS: Full code Diet recommendation: Full liquid diet until follow-up  Discharge summary: 54 year old gentleman with history of colon cancer status post resection, gastric sleeve about a year ago and ventral hernia, history of hypertension but not on any treatment was admitted to the hospital with partial small bowel obstruction treated conservatively.  Admitted under [facility] care but mainly managed by surgery.  Improved and going home today on full liquid diet.  Scheduled follow-up with surgery next week.    Discharge Diagnoses:  Principal Problem:   SBO (small bowel obstruction) (HCC) Active Problems:   Ventral hernia-midline   Essential hypertension   Ventral hernia with obstruction and without gangrene    Discharge Instructions  Discharge Instructions    Diet - low sodium heart healthy   Complete by: As directed    Discharge instructions   Complete by: As directed    Stay on full liquid diet until feel well.  Follow up with surgery as scheduled   Increase activity slowly   Complete by: As directed      Allergies as of 08/28/2020   No Known Allergies     Medication List    TAKE these medications   acetaminophen 500 MG tablet Commonly known as: TYLENOL Take 500 mg by mouth every 6 (six) hours as needed for moderate pain or headache.   calcium carbonate 1250 (500 Ca) MG chewable tablet Commonly known as: OS-CAL Chew 1 tablet by mouth daily.   cholecalciferol 25 MCG (1000 UNIT) tablet Commonly known as: VITAMIN D3 Take 1,000  Units by mouth daily.   ferrous sulfate 325 (65 FE) MG tablet Take 325 mg by mouth daily with breakfast.   multivitamin with minerals tablet Take 1 tablet by mouth daily.   vitamin B-12 1000 MCG tablet Commonly known as: CYANOCOBALAMIN Take 1,000 mcg by mouth daily.       Follow-up Information    Ralene Ok, MD. Go on 10/01/2020.   Specialty: General Surgery Why: Appointment scheduled for 11:20 AM. Please arrive 30 min prior to appointment time and bring photo ID and insurance information.  Contact information: [place] [address] [phone]              No Known Allergies  Consultations:  General surgery   Procedures/Studies: CT Abdomen Pelvis W Contrast  Result Date: 08/25/2020 CLINICAL DATA:  Postprandial abdominal pain, remote history of small-bowel obstruction and bariatric surgery EXAM: CT ABDOMEN AND PELVIS WITH CONTRAST TECHNIQUE: Multidetector CT imaging of the abdomen and pelvis was performed using the standard protocol following bolus administration of intravenous contrast. CONTRAST:  126mL OMNIPAQUE IOHEXOL 300 MG/ML  SOLN COMPARISON:  04/13/2020 FINDINGS: Lower chest: Small left diaphragmatic hernia with herniation of retroperitoneal fat into the thoracic cavity. The visualized lung bases are otherwise clear. The visualized heart and pericardium are unremarkable. Hepatobiliary: No focal liver abnormality is seen. No gallstones, gallbladder wall thickening, or biliary dilatation. Pancreas: Unremarkable Spleen: Unremarkable Adrenals/Urinary Tract: Indeterminate 23 mm nodule is seen within the left adrenal gland is stable from multiple remote prior examinations dating as far back as 01/27/2008,  almost certainly representing a benign adrenal adenoma. Right adrenal gland is unremarkable. The kidneys are normal in size and position. Simple cortical cyst noted within the left kidney. The kidneys are otherwise unremarkable. Bladder is  unremarkable. Stomach/Bowel: Surgical changes of gastric sleeve resection and right hemicolectomy are identified. Large ventral hernia is present containing multiple loops of unremarkable large and small bowel. There is, however, a a distal small bowel obstruction present with multiple dilated fluid-filled loops of small bowel extending to a single point of transition as the bowel enters into the hernia sac, best seen on axial image # 50 and coronal image # 53. No free intraperitoneal gas or fluid. Vascular/Lymphatic: Mild atherosclerotic calcification within the abdominal aorta. No aortic aneurysm. The abdominal vasculature is otherwise unremarkable. No pathologic adenopathy. Reproductive: Prostate is unremarkable. Other: Rectum unremarkable Musculoskeletal: Degenerative changes are seen within the lumbar spine. No suspicious lytic or blastic bone lesions are seen. IMPRESSION: Small-bowel obstruction with single point of transition involving the mid to distal small bowel entering into the ventral hernia sac. Large ventral hernia containing multiple loops of large and small bowel including the ileocolic anastomosis. Status post gastric sleeve resection and right hemicolectomy. Aortic Atherosclerosis (ICD10-I70.0). Electronically Signed   By: Fidela Salisbury MD   On: 08/25/2020 03:22   DG Abd Portable 1V-Small Bowel Obstruction Protocol-initial, 8 hr delay  Result Date: 08/25/2020 CLINICAL DATA:  54 year old male with 8 hour delay small-bowel protocol. EXAM: PORTABLE ABDOMEN - 1 VIEW COMPARISON:  CT abdomen pelvis dated 08/25/2020. FINDINGS: Oral contrast noted in a dilated loop of small bowel in the right lower quadrant. There is contrast in the transverse colon. IMPRESSION: Persistent dilatation of small bowel with contrast in the transverse colon. Findings likely represent a partial obstruction. Clinical correlation and follow-up recommended. Electronically Signed   By: Anner Crete M.D.   On: 08/25/2020  18:51   (Echo, Carotid, EGD, Colonoscopy, ERCP)    Subjective: Patient seen and examined.  No overnight events.  Tolerating liquid diet.  He had multiple bowel movements yesterday.  Eager to go home.   Discharge Exam: Vitals:   08/27/20 2145 08/28/20 0518  BP: 134/72 136/86  Pulse: (!) 57 (!) 59  Resp: 20 16  Temp: 97.9 F (36.6 C) 98 F (36.7 C)  SpO2: 98% 98%   Vitals:   08/27/20 0527 08/27/20 1337 08/27/20 2145 08/28/20 0518  BP: (!) 156/71 (!) 159/85 134/72 136/86  Pulse: 70 (!) 57 (!) 57 (!) 59  Resp: 18 15 20 16   Temp: 98.3 F (36.8 C) 98.4 F (36.9 C) 97.9 F (36.6 C) 98 F (36.7 C)  TempSrc:  Oral    SpO2: 96% 98% 98% 98%  Weight: (!) 137.4 kg     Height: 6' (1.829 m)       General: Pt is alert, awake, not in acute distress Cardiovascular: RRR, S1/S2 +, no rubs, no gallops Respiratory: CTA bilaterally, no wheezing, no rhonchi Abdominal: Soft,, nontender.  Distended.  Bowel sounds present.  Midline incision present. Extremities: no edema, no cyanosis    The results of significant diagnostics from this hospitalization (including imaging, microbiology, ancillary and laboratory) are listed below for reference.     Microbiology: Recent Results (from the past 240 hour(s))  Resp Panel by RT-PCR (Flu A&B, Covid) Nasopharyngeal Swab     Status: None   Collection Time: 08/25/20  3:38 AM   Specimen: Nasopharyngeal Swab; Nasopharyngeal(NP) swabs in vial transport medium  Result Value Ref Range Status   SARS  Coronavirus 2 by RT PCR NEGATIVE NEGATIVE Final    Comment: (NOTE) SARS-CoV-2 target nucleic acids are NOT DETECTED.  The SARS-CoV-2 RNA is generally detectable in upper respiratory specimens during the acute phase of infection. The lowest concentration of SARS-CoV-2 viral copies this assay can detect is 138 copies/mL. A negative result does not preclude SARS-Cov-2 infection and should not be used as the sole basis for treatment or other patient management  decisions. A negative result may occur with  improper specimen collection/handling, submission of specimen other than nasopharyngeal swab, presence of viral mutation(s) within the areas targeted by this assay, and inadequate number of viral copies(<138 copies/mL). A negative result must be combined with clinical observations, patient history, and epidemiological information. The expected result is Negative.  Fact Sheet for Patients:  EntrepreneurPulse.com.au  Fact Sheet for Healthcare Providers:  IncredibleEmployment.be  This test is no t yet approved or cleared by the [place] FDA and  has been authorized for detection and/or diagnosis of SARS-CoV-2 by FDA under an Emergency Use Authorization (EUA). This EUA will remain  in effect (meaning this test can be used) for the duration of the COVID-19 declaration under Section 564(b)(1) of the Act, 21 U.S.C.section 360bbb-3(b)(1), unless the authorization is terminated  or revoked sooner.       Influenza A by PCR NEGATIVE NEGATIVE Final   Influenza B by PCR NEGATIVE NEGATIVE Final    Comment: (NOTE) The Xpert Xpress SARS-CoV-2/FLU/RSV plus assay is intended as an aid in the diagnosis of influenza from Nasopharyngeal swab specimens and should not be used as a sole basis for treatment. Nasal washings and aspirates are unacceptable for Xpert Xpress SARS-CoV-2/FLU/RSV testing.  Fact Sheet for Patients: EntrepreneurPulse.com.au  Fact Sheet for Healthcare Providers: IncredibleEmployment.be  This test is not yet approved or cleared by the [place] FDA and has been authorized for detection and/or diagnosis of SARS-CoV-2 by FDA under an Emergency Use Authorization (EUA). This EUA will remain in effect (meaning this test can be used) for the duration of the COVID-19 declaration under Section 564(b)(1) of the Act, 21 U.S.C. section 360bbb-3(b)(1), unless the  authorization is terminated or revoked.  Performed at [facility], [place] [address], [place], [place] [address]      Labs: BNP (last 3 results) No results for input(s): BNP in the last 8760 hours. Basic Metabolic Panel: Recent Labs  Lab 08/25/20 0157 08/27/20 0520  NA 141 140  K 3.7 3.8  CL 103 105  CO2 26 25  GLUCOSE 137* 91  BUN 13 8  CREATININE 0.95 1.01  CALCIUM 9.4 8.8*  MG  --  1.9  PHOS  --  3.3   Liver Function Tests: Recent Labs  Lab 08/25/20 0157  AST 28  ALT 34  ALKPHOS 42  BILITOT 0.7  PROT 7.3  ALBUMIN 4.5   Recent Labs  Lab 08/25/20 0157  LIPASE 25   No results for input(s): AMMONIA in the last 168 hours. CBC: Recent Labs  Lab 08/25/20 0157  WBC 6.8  NEUTROABS 4.6  HGB 16.0  HCT 49.3  MCV 90.1  PLT 180   Cardiac Enzymes: No results for input(s): CKTOTAL, CKMB, CKMBINDEX, TROPONINI in the last 168 hours. BNP: Invalid input(s): POCBNP CBG: No results for input(s): GLUCAP in the last 168 hours. D-Dimer No results for input(s): DDIMER in the last 72 hours. Hgb A1c No results for input(s): HGBA1C in the last 72 hours. Lipid Profile No results for input(s): CHOL, HDL, LDLCALC, TRIG, CHOLHDL,  LDLDIRECT in the last 72 hours. Thyroid function studies No results for input(s): TSH, T4TOTAL, T3FREE, THYROIDAB in the last 72 hours.  Invalid input(s): FREET3 Anemia work up No results for input(s): VITAMINB12, FOLATE, FERRITIN, TIBC, IRON, RETICCTPCT in the last 72 hours. Urinalysis    Component Value Date/Time   COLORURINE YELLOW 08/25/2020 0400   APPEARANCEUR HAZY (A) 08/25/2020 0400   LABSPEC 1.016 08/25/2020 0400   PHURINE 9.0 (H) 08/25/2020 0400   GLUCOSEU NEGATIVE 08/25/2020 0400   HGBUR NEGATIVE 08/25/2020 0400   BILIRUBINUR NEGATIVE 08/25/2020 0400   KETONESUR NEGATIVE 08/25/2020 0400   PROTEINUR NEGATIVE 08/25/2020 0400   UROBILINOGEN 0.2 11/10/2011 1045   NITRITE NEGATIVE 08/25/2020 0400   LEUKOCYTESUR  NEGATIVE 08/25/2020 0400   Sepsis Labs Invalid input(s): PROCALCITONIN,  WBC,  LACTICIDVEN Microbiology Recent Results (from the past 240 hour(s))  Resp Panel by RT-PCR (Flu A&B, Covid) Nasopharyngeal Swab     Status: None   Collection Time: 08/25/20  3:38 AM   Specimen: Nasopharyngeal Swab; Nasopharyngeal(NP) swabs in vial transport medium  Result Value Ref Range Status   SARS Coronavirus 2 by RT PCR NEGATIVE NEGATIVE Final    Comment: (NOTE) SARS-CoV-2 target nucleic acids are NOT DETECTED.  The SARS-CoV-2 RNA is generally detectable in upper respiratory specimens during the acute phase of infection. The lowest concentration of SARS-CoV-2 viral copies this assay can detect is 138 copies/mL. A negative result does not preclude SARS-Cov-2 infection and should not be used as the sole basis for treatment or other patient management decisions. A negative result may occur with  improper specimen collection/handling, submission of specimen other than nasopharyngeal swab, presence of viral mutation(s) within the areas targeted by this assay, and inadequate number of viral copies(<138 copies/mL). A negative result must be combined with clinical observations, patient history, and epidemiological information. The expected result is Negative.  Fact Sheet for Patients:  EntrepreneurPulse.com.au  Fact Sheet for Healthcare Providers:  IncredibleEmployment.be  This test is no t yet approved or cleared by the [place] FDA and  has been authorized for detection and/or diagnosis of SARS-CoV-2 by FDA under an Emergency Use Authorization (EUA). This EUA will remain  in effect (meaning this test can be used) for the duration of the COVID-19 declaration under Section 564(b)(1) of the Act, 21 U.S.C.section 360bbb-3(b)(1), unless the authorization is terminated  or revoked sooner.       Influenza A by PCR NEGATIVE NEGATIVE Final   Influenza B by PCR  NEGATIVE NEGATIVE Final    Comment: (NOTE) The Xpert Xpress SARS-CoV-2/FLU/RSV plus assay is intended as an aid in the diagnosis of influenza from Nasopharyngeal swab specimens and should not be used as a sole basis for treatment. Nasal washings and aspirates are unacceptable for Xpert Xpress SARS-CoV-2/FLU/RSV testing.  Fact Sheet for Patients: EntrepreneurPulse.com.au  Fact Sheet for Healthcare Providers: IncredibleEmployment.be  This test is not yet approved or cleared by the [place] FDA and has been authorized for detection and/or diagnosis of SARS-CoV-2 by FDA under an Emergency Use Authorization (EUA). This EUA will remain in effect (meaning this test can be used) for the duration of the COVID-19 declaration under Section 564(b)(1) of the Act, 21 U.S.C. section 360bbb-3(b)(1), unless the authorization is terminated or revoked.  Performed at [facility], [place] [address] [place], [place] [address]      Time coordinating discharge: 25 minutes  SIGNED:   Barb Merino, MD  [facility] 08/28/2020, 11:22 AM

## 2020-08-28 NOTE — Progress Notes (Signed)
Pt d/ced via w/c w all belongings.

## 2020-09-01 DIAGNOSIS — Z09 Encounter for follow-up examination after completed treatment for conditions other than malignant neoplasm: Secondary | ICD-10-CM | POA: Diagnosis not present

## 2020-09-01 DIAGNOSIS — K439 Ventral hernia without obstruction or gangrene: Secondary | ICD-10-CM | POA: Diagnosis not present

## 2020-09-08 ENCOUNTER — Ambulatory Visit: Payer: BC Managed Care – PPO | Admitting: Professional

## 2020-09-09 ENCOUNTER — Ambulatory Visit: Payer: BC Managed Care – PPO | Admitting: Professional

## 2020-10-06 ENCOUNTER — Ambulatory Visit: Payer: BC Managed Care – PPO | Admitting: Professional

## 2020-11-03 ENCOUNTER — Ambulatory Visit (INDEPENDENT_AMBULATORY_CARE_PROVIDER_SITE_OTHER): Payer: BC Managed Care – PPO | Admitting: Professional

## 2020-11-03 DIAGNOSIS — F411 Generalized anxiety disorder: Secondary | ICD-10-CM | POA: Diagnosis not present

## 2020-11-04 ENCOUNTER — Telehealth: Payer: Self-pay | Admitting: Skilled Nursing Facility1

## 2020-11-04 NOTE — Telephone Encounter (Signed)
Dietitian called pt.  Pt states she had a small bowel obstruction and will not be getting surgery until he loses another 50-60 pounds; a hernia is present.   Dietitian advised pt to soft cook vegetables and not eat raw salads.

## 2020-12-01 ENCOUNTER — Ambulatory Visit: Payer: BC Managed Care – PPO | Admitting: Professional

## 2020-12-08 ENCOUNTER — Ambulatory Visit: Payer: BC Managed Care – PPO | Admitting: Skilled Nursing Facility1

## 2020-12-22 ENCOUNTER — Ambulatory Visit: Payer: BC Managed Care – PPO | Admitting: Professional

## 2021-01-25 ENCOUNTER — Ambulatory Visit: Payer: Self-pay | Admitting: Skilled Nursing Facility1

## 2021-03-29 DIAGNOSIS — Z20822 Contact with and (suspected) exposure to covid-19: Secondary | ICD-10-CM | POA: Diagnosis not present

## 2021-04-21 ENCOUNTER — Other Ambulatory Visit: Payer: Self-pay

## 2021-04-21 ENCOUNTER — Emergency Department (HOSPITAL_COMMUNITY): Payer: BC Managed Care – PPO

## 2021-04-21 ENCOUNTER — Encounter (HOSPITAL_COMMUNITY): Payer: Self-pay

## 2021-04-21 ENCOUNTER — Inpatient Hospital Stay (HOSPITAL_COMMUNITY)
Admission: EM | Admit: 2021-04-21 | Discharge: 2021-04-27 | DRG: 336 | Disposition: A | Discharge status: Home / Self Care | Payer: BC Managed Care – PPO | Attending: Family Medicine | Admitting: Family Medicine

## 2021-04-21 ENCOUNTER — Inpatient Hospital Stay (HOSPITAL_COMMUNITY): Payer: BC Managed Care – PPO

## 2021-04-21 DIAGNOSIS — Z9049 Acquired absence of other specified parts of digestive tract: Secondary | ICD-10-CM

## 2021-04-21 DIAGNOSIS — K5669 Other partial intestinal obstruction: Secondary | ICD-10-CM | POA: Diagnosis not present

## 2021-04-21 DIAGNOSIS — R110 Nausea: Secondary | ICD-10-CM | POA: Diagnosis not present

## 2021-04-21 DIAGNOSIS — M1990 Unspecified osteoarthritis, unspecified site: Secondary | ICD-10-CM | POA: Diagnosis present

## 2021-04-21 DIAGNOSIS — Z9884 Bariatric surgery status: Secondary | ICD-10-CM | POA: Diagnosis not present

## 2021-04-21 DIAGNOSIS — Z79899 Other long term (current) drug therapy: Secondary | ICD-10-CM | POA: Diagnosis not present

## 2021-04-21 DIAGNOSIS — K219 Gastro-esophageal reflux disease without esophagitis: Secondary | ICD-10-CM | POA: Diagnosis present

## 2021-04-21 DIAGNOSIS — K565 Intestinal adhesions [bands], unspecified as to partial versus complete obstruction: Principal | ICD-10-CM | POA: Diagnosis present

## 2021-04-21 DIAGNOSIS — G4733 Obstructive sleep apnea (adult) (pediatric): Secondary | ICD-10-CM | POA: Diagnosis not present

## 2021-04-21 DIAGNOSIS — Z0189 Encounter for other specified special examinations: Secondary | ICD-10-CM

## 2021-04-21 DIAGNOSIS — Z4682 Encounter for fitting and adjustment of non-vascular catheter: Secondary | ICD-10-CM | POA: Diagnosis not present

## 2021-04-21 DIAGNOSIS — K56609 Unspecified intestinal obstruction, unspecified as to partial versus complete obstruction: Secondary | ICD-10-CM | POA: Diagnosis not present

## 2021-04-21 DIAGNOSIS — Z85038 Personal history of other malignant neoplasm of large intestine: Secondary | ICD-10-CM | POA: Diagnosis not present

## 2021-04-21 DIAGNOSIS — E785 Hyperlipidemia, unspecified: Secondary | ICD-10-CM | POA: Diagnosis present

## 2021-04-21 DIAGNOSIS — I10 Essential (primary) hypertension: Secondary | ICD-10-CM | POA: Diagnosis present

## 2021-04-21 DIAGNOSIS — Z6841 Body Mass Index (BMI) 40.0 and over, adult: Secondary | ICD-10-CM

## 2021-04-21 DIAGNOSIS — Z20822 Contact with and (suspected) exposure to covid-19: Secondary | ICD-10-CM | POA: Diagnosis not present

## 2021-04-21 DIAGNOSIS — K6389 Other specified diseases of intestine: Secondary | ICD-10-CM | POA: Diagnosis not present

## 2021-04-21 DIAGNOSIS — D3502 Benign neoplasm of left adrenal gland: Secondary | ICD-10-CM | POA: Diagnosis not present

## 2021-04-21 DIAGNOSIS — R109 Unspecified abdominal pain: Secondary | ICD-10-CM | POA: Diagnosis not present

## 2021-04-21 DIAGNOSIS — R1084 Generalized abdominal pain: Secondary | ICD-10-CM | POA: Diagnosis not present

## 2021-04-21 DIAGNOSIS — K439 Ventral hernia without obstruction or gangrene: Secondary | ICD-10-CM | POA: Diagnosis not present

## 2021-04-21 DIAGNOSIS — K660 Peritoneal adhesions (postprocedural) (postinfection): Secondary | ICD-10-CM | POA: Diagnosis not present

## 2021-04-21 DIAGNOSIS — K432 Incisional hernia without obstruction or gangrene: Secondary | ICD-10-CM | POA: Diagnosis present

## 2021-04-21 DIAGNOSIS — R0902 Hypoxemia: Secondary | ICD-10-CM | POA: Diagnosis not present

## 2021-04-21 DIAGNOSIS — K760 Fatty (change of) liver, not elsewhere classified: Secondary | ICD-10-CM | POA: Diagnosis present

## 2021-04-21 DIAGNOSIS — K430 Incisional hernia with obstruction, without gangrene: Secondary | ICD-10-CM | POA: Diagnosis not present

## 2021-04-21 LAB — COMPREHENSIVE METABOLIC PANEL
ALT: 40 U/L (ref 0–44)
AST: 28 U/L (ref 15–41)
Albumin: 4.2 g/dL (ref 3.5–5.0)
Alkaline Phosphatase: 45 U/L (ref 38–126)
Anion gap: 9 (ref 5–15)
BUN: 12 mg/dL (ref 6–20)
CO2: 26 mmol/L (ref 22–32)
Calcium: 9.6 mg/dL (ref 8.9–10.3)
Chloride: 106 mmol/L (ref 98–111)
Creatinine, Ser: 0.88 mg/dL (ref 0.61–1.24)
GFR, Estimated: >60 mL/min (ref >60)
Glucose, Bld: 149 mg/dL — ABNORMAL HIGH (ref 70–99)
Potassium: 4.3 mmol/L (ref 3.5–5.1)
Sodium: 141 mmol/L (ref 135–145)
Total Bilirubin: 0.6 mg/dL (ref 0.3–1.2)
Total Protein: 7.1 g/dL (ref 6.5–8.1)

## 2021-04-21 LAB — URINALYSIS, ROUTINE W REFLEX MICROSCOPIC
Bilirubin Urine: NEGATIVE
Glucose, UA: NEGATIVE mg/dL
Hgb urine dipstick: NEGATIVE
Ketones, ur: NEGATIVE mg/dL
Leukocytes,Ua: NEGATIVE
Nitrite: NEGATIVE
Protein, ur: NEGATIVE mg/dL
Specific Gravity, Urine: 1.02 (ref 1.005–1.030)
pH: 7 (ref 5.0–8.0)

## 2021-04-21 LAB — CBC WITH DIFFERENTIAL/PLATELET
Abs Immature Granulocytes: 0.04 10*3/uL (ref 0.00–0.07)
Basophils Absolute: 0 10*3/uL (ref 0.0–0.1)
Basophils Relative: 0 %
Eosinophils Absolute: 0 10*3/uL (ref 0.0–0.5)
Eosinophils Relative: 0 %
HCT: 50.7 % (ref 39.0–52.0)
Hemoglobin: 16.3 g/dL (ref 13.0–17.0)
Immature Granulocytes: 1 %
Lymphocytes Relative: 8 %
Lymphs Abs: 0.6 10*3/uL — ABNORMAL LOW (ref 0.7–4.0)
MCH: 29.1 pg (ref 26.0–34.0)
MCHC: 32.1 g/dL (ref 30.0–36.0)
MCV: 90.4 fL (ref 80.0–100.0)
Monocytes Absolute: 0.2 10*3/uL (ref 0.1–1.0)
Monocytes Relative: 3 %
Neutro Abs: 6.9 10*3/uL (ref 1.7–7.7)
Neutrophils Relative %: 88 %
Platelets: 175 10*3/uL (ref 150–400)
RBC: 5.61 MIL/uL (ref 4.22–5.81)
RDW: 13.5 % (ref 11.5–15.5)
WBC: 7.7 10*3/uL (ref 4.0–10.5)
nRBC: 0 % (ref 0.0–0.2)

## 2021-04-21 LAB — RESP PANEL BY RT-PCR (FLU A&B, COVID) ARPGX2
Influenza A by PCR: NEGATIVE
Influenza B by PCR: NEGATIVE
SARS Coronavirus 2 by RT PCR: NEGATIVE

## 2021-04-21 LAB — LIPASE, BLOOD: Lipase: 28 U/L (ref 11–51)

## 2021-04-21 LAB — GLUCOSE, CAPILLARY
Glucose-Capillary: 114 mg/dL — ABNORMAL HIGH (ref 70–99)
Glucose-Capillary: 156 mg/dL — ABNORMAL HIGH (ref 70–99)

## 2021-04-21 MED ORDER — MORPHINE SULFATE (PF) 4 MG/ML IV SOLN
4.0000 mg | Freq: Once | INTRAVENOUS | Status: AC
  Administered 2021-04-21: 4 mg via INTRAMUSCULAR
  Filled 2021-04-21: qty 1

## 2021-04-21 MED ORDER — IOHEXOL 350 MG/ML SOLN
100.0000 mL | Freq: Once | INTRAVENOUS | Status: AC | PRN
  Administered 2021-04-21: 80 mL via INTRAVENOUS

## 2021-04-21 MED ORDER — DIATRIZOATE MEGLUMINE & SODIUM 66-10 % PO SOLN
90.0000 mL | Freq: Once | ORAL | Status: AC
  Administered 2021-04-21: 90 mL via NASOGASTRIC
  Filled 2021-04-21: qty 90

## 2021-04-21 MED ORDER — MORPHINE SULFATE (PF) 4 MG/ML IV SOLN
4.0000 mg | Freq: Once | INTRAVENOUS | Status: AC
  Administered 2021-04-21: 4 mg via INTRAVENOUS
  Filled 2021-04-21: qty 1

## 2021-04-21 MED ORDER — MORPHINE SULFATE (PF) 2 MG/ML IV SOLN
2.0000 mg | INTRAVENOUS | Status: DC | PRN
Start: 2021-04-21 — End: 2021-04-22
  Administered 2021-04-21 – 2021-04-22 (×3): 2 mg via INTRAVENOUS
  Filled 2021-04-21 (×3): qty 1

## 2021-04-21 MED ORDER — HYDRALAZINE HCL 20 MG/ML IJ SOLN
10.0000 mg | INTRAMUSCULAR | Status: DC | PRN
  Administered 2021-04-22: 10 mg via INTRAVENOUS
  Filled 2021-04-21: qty 1

## 2021-04-21 MED ORDER — DEXTROSE-NACL 5-0.9 % IV SOLN: INTRAVENOUS | Status: DC

## 2021-04-21 MED ORDER — ONDANSETRON HCL 4 MG/2ML IJ SOLN
4.0000 mg | Freq: Once | INTRAMUSCULAR | Status: AC
  Administered 2021-04-21: 4 mg via INTRAVENOUS
  Filled 2021-04-21: qty 2

## 2021-04-21 MED ORDER — ONDANSETRON HCL 4 MG PO TABS: 4.0000 mg | ORAL_TABLET | Freq: Four times a day (QID) | ORAL | Status: DC | PRN

## 2021-04-21 MED ORDER — ONDANSETRON HCL 4 MG/2ML IJ SOLN
4.0000 mg | Freq: Four times a day (QID) | INTRAMUSCULAR | Status: DC | PRN
  Administered 2021-04-21 – 2021-04-22 (×3): 4 mg via INTRAVENOUS
  Filled 2021-04-21 (×3): qty 2

## 2021-04-21 MED ORDER — IOHEXOL 350 MG/ML SOLN
85.0000 mL | Freq: Once | INTRAVENOUS | Status: AC | PRN
  Administered 2021-04-21: 85 mL via INTRAVENOUS

## 2021-04-21 MED ORDER — MORPHINE SULFATE (PF) 4 MG/ML IV SOLN
4.0000 mg | Freq: Once | INTRAVENOUS | Status: DC
  Filled 2021-04-21: qty 1

## 2021-04-21 MED ORDER — ACETAMINOPHEN 325 MG PO TABS: 650.0000 mg | ORAL_TABLET | Freq: Four times a day (QID) | ORAL | Status: DC | PRN

## 2021-04-21 MED ORDER — ACETAMINOPHEN 650 MG RE SUPP: 650.0000 mg | Freq: Four times a day (QID) | RECTAL | Status: DC | PRN

## 2021-04-21 NOTE — ED Notes (Signed)
Pt IV ([place] IV placed by provider) infiltrated while pt in CT, provider aware

## 2021-04-21 NOTE — ED Notes (Signed)
Previous IV infiltrated while pt was in CT, this RN attempted and CT attempted x 2 without success, IV team ordered, provider aware

## 2021-04-21 NOTE — ED Triage Notes (Signed)
Pt arrived via EMS from home, c/o abd pain that started around 10pm. States took a percocet with little effect. States similar pain with previous SBO.

## 2021-04-21 NOTE — ED Notes (Signed)
Per [place]@ [facility]- recollect needed on light green lab tube. [employer]

## 2021-04-21 NOTE — Progress Notes (Signed)
Patient has home CPAP machine at bedside but declines nocturnal use at this time due to an indwelling ngt. RT will continue to follow and assist as patient allows.

## 2021-04-21 NOTE — H&P (Signed)
History and Physical    Cameron Jones [id number] DOB: 12-06-1965 DOA: 04/21/2021  PCP: Haywood Pao, MD  Patient coming from: Home.  Chief Complaint: Abdominal pain and nausea.  HPI: Cameron Jones is a 55 y.o. male with history of colon cancer s/p resection, history of gastric sleeve surgery, hypertension, sleep apnea presents to the ER with complaint of abdominal pain with nausea but denies any vomiting.  Last bowel movement was more than 24 hours ago.  Abdominal pain is diffuse.  ED Course: In the ER patient had a CT abdomen pelvis which shows features concerning for small bowel obstruction with a transition point.  On-call general surgeon has been consulted.  G-tube has been ordered.  COVID test is negative.  Labs are largely unremarkable.  Review of Systems: As per HPI, rest all negative.   Past Medical History:  Diagnosis Date   Arthritis    Knee   Cancer ([place])    colon   Carpal tunnel syndrome of right wrist    resolved    Fatty liver    per Dr Radford Pax note   GERD (gastroesophageal reflux disease)    Hernia    History of blood transfusion    Hypertension    Lower back pain    Obesity    Sleep apnea    severe per report 12/11  on chart   Ventral hernia     Past Surgical History:  Procedure Laterality Date   BREATH Eliezer Champagne  11/07/2011   Procedure: BREATH TEK H PYLORI;  Surgeon: Shann Medal, MD;  Location: [facility];  Service: General;  Laterality: N/A;   BREATH TEK H PYLORI  01/02/2012   Procedure: Lauris Chroman;  Surgeon: Pedro Earls, MD;  Location: [facility];  Service: General;  Laterality: N/A;   COLON SURGERY  06/2006   colectomy   COLONOSCOPY N/A 05/13/2013   Procedure: COLONOSCOPY;  Surgeon: Winfield Cunas., MD;  Location: [facility];  Service: Endoscopy;  Laterality: N/A;   COLONOSCOPY N/A 08/30/2017   Procedure: COLONOSCOPY;  Surgeon: Laurence Spates, MD;  Location: [facility];  Service: Endoscopy;   Laterality: N/A;   INGUINAL HERNIA REPAIR N/A 08/25/2019   Procedure: HERNIA REPAIR INCARCERATED;  Surgeon: Johnathan Hausen, MD;  Location: [facility];  Service: General;  Laterality: N/A;   LAPAROSCOPIC GASTRIC BAND REMOVAL WITH LAPAROSCOPIC GASTRIC SLEEVE RESECTION N/A 08/25/2019   Procedure: LAPAROSCOPIC GASTRIC BAND REMOVAL WITH LAPAROSCOPIC GASTRIC SLEEVE RESECTION, Upper Endo, ERAS Pathway;  Surgeon: Johnathan Hausen, MD;  Location: [facility];  Service: General;  Laterality: N/A;   LAPAROSCOPIC GASTRIC BANDING  05/07/2012   Procedure: LAPAROSCOPIC GASTRIC BANDING;  Surgeon: Pedro Earls, MD;  Location: [facility];  Service: General;  Laterality: N/A;   LAPAROSCOPIC LYSIS OF ADHESIONS N/A 08/25/2019   Procedure: LAPAROSCOPIC LYSIS OF ADHESIONS;  Surgeon: Johnathan Hausen, MD;  Location: [facility];  Service: General;  Laterality: N/A;     reports that he has never smoked. He has never used smokeless tobacco. He reports previous alcohol use of about 1.0 standard drink of alcohol per week. He reports that he does not use drugs.  No Known Allergies  Family History  Problem Relation Age of Onset   Cancer Mother        pancreatic   Cancer Brother        colon   Alzheimer's disease Father    Hypertension Sister    Cancer Maternal Aunt  breast    Prior to Admission medications   Medication Sig Start Date End Date Taking? Authorizing Provider  acetaminophen (TYLENOL) 500 MG tablet Take 500 mg by mouth every 6 (six) hours as needed for moderate pain or headache.   Yes [provider]  amLODipine (NORVASC) 10 MG tablet Take 10 mg by mouth daily.   Yes [provider]  calcium carbonate (OS-CAL) 1250 (500 Ca) MG chewable tablet Chew 1 tablet by mouth daily.   Yes [provider]  cholecalciferol (VITAMIN D3) 25 MCG (1000 UT) tablet Take 1,000 Units by mouth daily.   Yes [provider]  ferrous sulfate 325 (65 FE) MG tablet Take 325 mg by mouth daily with  breakfast.   Yes [provider]  Multiple Vitamins-Minerals (MULTIVITAMIN WITH MINERALS) tablet Take 1 tablet by mouth daily.    Yes [provider]  vitamin B-12 (CYANOCOBALAMIN) 1000 MCG tablet Take 1,000 mcg by mouth daily.   Yes [provider]    Physical Exam: Constitutional: Moderately built and nourished. Vitals:   04/21/21 1100 04/21/21 1204 04/21/21 1230 04/21/21 1300  BP: (!) 156/84 (!) 145/63 (!) 168/72 (!) 165/76  Pulse: (!) 56 (!) 56 (!) 56 (!) 57  Resp: '18 18 18 18  '$ Temp:    98 F (36.7 C)  TempSrc:    Oral  SpO2: 99% 98% 96% 97%   Eyes: Anicteric no pallor. ENMT: No discharge from the ears eyes nose and mouth. Neck: No mass felt.  No neck rigidity. Respiratory: No rhonchi or crepitations. Cardiovascular: S1-S2 heard. Abdomen: Soft nontender bowel sounds not appreciated.  No guarding or rigidity. Musculoskeletal: No edema. Skin: No rash. Neurologic: Alert awake oriented to time place and person.  Moves all extremities. Psychiatric: Appears normal.  Normal affect.   Labs on Admission: I have personally reviewed following labs and imaging studies  CBC: Recent Labs  Lab 04/21/21 0420  WBC 7.7  NEUTROABS 6.9  HGB 16.3  HCT 50.7  MCV 90.4  PLT 0000000   Basic Metabolic Panel: Recent Labs  Lab 04/21/21 0506  NA 141  K 4.3  CL 106  CO2 26  GLUCOSE 149*  BUN 12  CREATININE 0.88  CALCIUM 9.6   GFR: CrCl cannot be calculated (Unknown ideal weight.). Liver Function Tests: Recent Labs  Lab 04/21/21 0506  AST 28  ALT 40  ALKPHOS 45  BILITOT 0.6  PROT 7.1  ALBUMIN 4.2   Recent Labs  Lab 04/21/21 0506  LIPASE 28   No results for input(s): AMMONIA in the last 168 hours. Coagulation Profile: No results for input(s): INR, PROTIME in the last 168 hours. Cardiac Enzymes: No results for input(s): CKTOTAL, CKMB, CKMBINDEX, TROPONINI in the last 168 hours. BNP (last 3 results) No results for input(s): PROBNP in the last  8760 hours. HbA1C: No results for input(s): HGBA1C in the last 72 hours. CBG: No results for input(s): GLUCAP in the last 168 hours. Lipid Profile: No results for input(s): CHOL, HDL, LDLCALC, TRIG, CHOLHDL, LDLDIRECT in the last 72 hours. Thyroid Function Tests: No results for input(s): TSH, T4TOTAL, FREET4, T3FREE, THYROIDAB in the last 72 hours. Anemia Panel: No results for input(s): VITAMINB12, FOLATE, FERRITIN, TIBC, IRON, RETICCTPCT in the last 72 hours. Urine analysis:    Component Value Date/Time   COLORURINE YELLOW 04/21/2021 0404   APPEARANCEUR HAZY (A) 04/21/2021 0404   LABSPEC 1.020 04/21/2021 0404   PHURINE 7.0 04/21/2021 0404   GLUCOSEU NEGATIVE 04/21/2021 0404   HGBUR  NEGATIVE 04/21/2021 0404   BILIRUBINUR NEGATIVE 04/21/2021 0404   KETONESUR NEGATIVE 04/21/2021 0404   PROTEINUR NEGATIVE 04/21/2021 0404   UROBILINOGEN 0.2 11/10/2011 1045   NITRITE NEGATIVE 04/21/2021 0404   LEUKOCYTESUR NEGATIVE 04/21/2021 0404   Sepsis Labs: '@LABRCNTIP'$ (procalcitonin:4,lacticidven:4) ) Recent Results (from the past 240 hour(s))  Resp Panel by RT-PCR (Flu A&B, Covid) Nasopharyngeal Swab     Status: None   Collection Time: 04/21/21 11:46 AM   Specimen: Nasopharyngeal Swab; Nasopharyngeal(NP) swabs in vial transport medium  Result Value Ref Range Status   SARS Coronavirus 2 by RT PCR NEGATIVE NEGATIVE Final    Comment: (NOTE) SARS-CoV-2 target nucleic acids are NOT DETECTED.  The SARS-CoV-2 RNA is generally detectable in upper respiratory specimens during the acute phase of infection. The lowest concentration of SARS-CoV-2 viral copies this assay can detect is 138 copies/mL. A negative result does not preclude SARS-Cov-2 infection and should not be used as the sole basis for treatment or other patient management decisions. A negative result may occur with  improper specimen collection/handling, submission of specimen other than nasopharyngeal swab, presence of viral  mutation(s) within the areas targeted by this assay, and inadequate number of viral copies(<138 copies/mL). A negative result must be combined with clinical observations, patient history, and epidemiological information. The expected result is Negative.  Fact Sheet for Patients:  EntrepreneurPulse.com.au  Fact Sheet for Healthcare Providers:  IncredibleEmployment.be  This test is no t yet approved or cleared by the [place] FDA and  has been authorized for detection and/or diagnosis of SARS-CoV-2 by FDA under an Emergency Use Authorization (EUA). This EUA will remain  in effect (meaning this test can be used) for the duration of the COVID-19 declaration under Section 564(b)(1) of the Act, 21 U.S.C.section 360bbb-3(b)(1), unless the authorization is terminated  or revoked sooner.       Influenza A by PCR NEGATIVE NEGATIVE Final   Influenza B by PCR NEGATIVE NEGATIVE Final    Comment: (NOTE) The Xpert Xpress SARS-CoV-2/FLU/RSV plus assay is intended as an aid in the diagnosis of influenza from Nasopharyngeal swab specimens and should not be used as a sole basis for treatment. Nasal washings and aspirates are unacceptable for Xpert Xpress SARS-CoV-2/FLU/RSV testing.  Fact Sheet for Patients: EntrepreneurPulse.com.au  Fact Sheet for Healthcare Providers: IncredibleEmployment.be  This test is not yet approved or cleared by the [place] FDA and has been authorized for detection and/or diagnosis of SARS-CoV-2 by FDA under an Emergency Use Authorization (EUA). This EUA will remain in effect (meaning this test can be used) for the duration of the COVID-19 declaration under Section 564(b)(1) of the Act, 21 U.S.C. section 360bbb-3(b)(1), unless the authorization is terminated or revoked.  Performed at [facility], [place] [address], [place], [place] [address]      Radiological Exams  on Admission: CT ABDOMEN PELVIS WO CONTRAST  Result Date: 04/21/2021 CLINICAL DATA:  Abdominal pain since last evening. EXAM: CT ABDOMEN AND PELVIS WITHOUT CONTRAST TECHNIQUE: Multidetector CT imaging of the abdomen and pelvis was performed following the standard protocol without IV contrast. COMPARISON:  CT scan 08/25/2020 FINDINGS: Lower chest: The lung bases are clear of an acute process. Stable small Bochdalek's hernia noted on the left. The heart is normal in size. No pericardial effusion. Hepatobiliary: No hepatic lesions are identified without contrast. The gallbladder is grossly normal. No common bile duct dilatation. Pancreas: No mass, inflammation or ductal dilatation. Spleen: Normal size. No focal lesions. Adrenals/Urinary Tract: Stable left adrenal gland adenoma.  The right adrenal gland is normal. No renal calculi, hydronephrosis or renal lesions are identified. Stomach/Bowel: Stable postoperative changes involving the stomach from gastric sleeve procedure. No complicating features. The duodenum is unremarkable. Dilated mid distal small bowel loops with air-fluid levels consistent with obstruction. Findings due to strictured narrowing or adhesive process along the right-side of the abdominal wall hernia. There are non dilated small bowel loops and draining hernia on the right side. This is the same location of obstruction noted on the prior CT scan. Part of the transverse colon is also in the anterior abdominal wall hernia and is slightly distended and contains moderate stool. There are postoperative changes from a right hemicolectomy with ileocolonic anastomosis involving the colon in the hernia. Vascular/Lymphatic: Minimal scattered vascular calcifications. No aneurysm. No mesenteric or retroperitoneal mass or adenopathy. Reproductive: The prostate gland and seminal vesicles are unremarkable. Other: No pelvic mass or adenopathy. No free pelvic fluid collections. No inguinal mass or adenopathy.  Musculoskeletal: No significant bony findings. IMPRESSION: 1. CT findings consistent with a mid distal small bowel obstruction due to strictured narrowing or adhesive process along the right-side of the abdominal wall hernia. This is the same location of obstruction noted on the prior CT scan. 2. Status post right hemicolectomy with ileocolonic anastomosis involving the colon in the hernia. 3. Stable postoperative changes involving the stomach from gastric sleeve procedure. 4. Stable left adrenal gland adenoma. Electronically Signed   By: Marijo Sanes M.D.   On: 04/21/2021 10:42     Assessment/Plan Principal Problem:   SBO (small bowel obstruction) (HCC) Active Problems:   Obstructive sleep apnea of adult   Essential hypertension    Small bowel obstruction per CAT scan obstruction could be either due to a stricture or adhesions.  We will keep patient n.p.o. NG tube to suction, IV fluids, on pain relief medications General surgery has been consulted.  Will follow further recommendations per general surgery. Hypertension - we will keep patient on as needed IV hydralazine while patient is NPO. History of sleep apnea on CPAP. History of colon cancer s/p resection and history of gastric sleeve surgery.  Since patient has bowel obstruction will need close monitoring for any further worsening inpatient status.   DVT prophylaxis: SCDs. Code Status: Full code. Family Communication: Patient's son at the bedside. Disposition Plan: Home. Consults called: General surgery. Admission status: Inpatient.   Rise Patience MD [facility] Pager [phone].  If 7PM-7AM, please contact night-coverage [email or web address] Password [email or web address]  04/21/2021, 1:27 PM

## 2021-04-21 NOTE — ED Provider Notes (Signed)
[facility] DEPT Provider Note   CSN: [id number] Arrival date & time: 04/21/21  [id number]     History No chief complaint on file.   Cameron Jones is a 55 y.o. male with a history of colon cancer s/p resection, gastric sleeve, and ventral hernia, HTN, and partial small bowel obstruction who presents to the emergency department with a chief complaint of abdominal pain.  The patient endorses sudden onset, constant, worsening, nonradiating upper abdominal pain that began at 22:00.  He is unable to characterize the pain, but states that it feels similar to his previous partial small bowel obstruction.  He initially reports that he had increased flatus, but now feels as if he is not able to pass gas.  His last bowel movement was earlier today and was normal.  He reports associated nausea and dry heaves.  No fever, chills, shortness of breath, chest pain, dysuria, hematuria, diarrhea, constipation, penile or testicular pain or swelling.  He attempted to take a Percocet at home with no improvement in his symptoms.  The history is provided by the patient and medical records. No language interpreter was used.      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   Essential hypertension 11/23/2018   OSA (obstructive sleep apnea) 07/26/2017   SBO (small bowel obstruction) ([place]) 12/27/2016   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 05/07/2012   Colon cancer-right T3 N0, Oct 2007  10/06/2011   Ventral hernia-midline 10/06/2011   Obstructive sleep apnea of adult 10/06/2011   Morbid obesity ([place]) 10/06/2011    Past Surgical History:  Procedure Laterality Date   BREATH TEK H PYLORI  11/07/2011   Procedure: BREATH TEK H PYLORI;  Surgeon: Shann Medal, MD;  Location: [facility];  Service: General;  Laterality: N/A;   BREATH TEK H PYLORI  01/02/2012   Procedure: Lauris Chroman;  Surgeon: Pedro Earls, MD;  Location: [facility];  Service: General;  Laterality: N/A;   COLON SURGERY  06/2006   colectomy   COLONOSCOPY N/A 05/13/2013   Procedure: COLONOSCOPY;  Surgeon: Winfield Cunas., MD;  Location: [facility];  Service: Endoscopy;  Laterality: N/A;   COLONOSCOPY N/A 08/30/2017   Procedure: COLONOSCOPY;  Surgeon: Laurence Spates, MD;  Location: [facility];  Service: Endoscopy;  Laterality: N/A;   INGUINAL HERNIA REPAIR N/A 08/25/2019   Procedure: HERNIA REPAIR INCARCERATED;  Surgeon: Johnathan Hausen, MD;  Location: [facility];  Service: General;  Laterality: N/A;   LAPAROSCOPIC GASTRIC BAND REMOVAL WITH LAPAROSCOPIC GASTRIC SLEEVE RESECTION N/A 08/25/2019   Procedure: LAPAROSCOPIC GASTRIC BAND REMOVAL WITH LAPAROSCOPIC GASTRIC SLEEVE RESECTION, Upper Endo, ERAS Pathway;  Surgeon: Johnathan Hausen, MD;  Location: [facility];  Service: General;  Laterality: N/A;   LAPAROSCOPIC GASTRIC BANDING  05/07/2012   Procedure: LAPAROSCOPIC GASTRIC BANDING;  Surgeon: Pedro Earls, MD;  Location: [facility];  Service: General;  Laterality: N/A;   LAPAROSCOPIC LYSIS OF ADHESIONS N/A 08/25/2019   Procedure: LAPAROSCOPIC LYSIS OF ADHESIONS;  Surgeon: Johnathan Hausen, MD;  Location: [facility];  Service: General;  Laterality: N/A;       Family History  Problem Relation Age of Onset   Cancer Mother        pancreatic   Cancer Brother        colon   Alzheimer's disease Father    Hypertension Sister    Cancer Maternal Aunt        breast    Social History   Tobacco Use    Smoking status: Never   Smokeless tobacco: Never  Vaping Use   Vaping Use: Never used  Substance Use Topics   Alcohol use: Not Currently    Alcohol/week: 1.0 standard drink    Types: 1 Standard drinks or equivalent per week   Drug use: No    Home Medications Prior to Admission medications   Medication Sig Start Date End Date Taking? Authorizing Provider  acetaminophen (TYLENOL) 500 MG tablet Take 500 mg by mouth every 6 (six) hours as needed for moderate pain or headache.    [provider]  calcium carbonate (OS-CAL) 1250 (500 Ca) MG chewable tablet Chew 1 tablet by mouth daily.    [provider]  cholecalciferol (VITAMIN D3) 25 MCG (1000 UT) tablet Take 1,000 Units by mouth daily.    [provider]  ferrous sulfate 325 (65 FE) MG tablet Take 325 mg by mouth daily with breakfast.    [provider]  Multiple Vitamins-Minerals (MULTIVITAMIN WITH MINERALS) tablet Take 1 tablet by mouth daily.     [provider]  vitamin B-12 (CYANOCOBALAMIN) 1000 MCG tablet Take 1,000 mcg by mouth daily.    [provider]    Allergies    Patient has no known allergies.  Review of Systems   Review of Systems  Constitutional:  Negative for appetite change and fever.  HENT:  Negative for congestion and sore throat.   Respiratory:  Negative for cough, shortness of breath and wheezing.   Cardiovascular:  Negative for chest pain, palpitations and leg swelling.  Gastrointestinal:  Positive for abdominal pain, nausea and vomiting. Negative for blood in stool, constipation, diarrhea and rectal pain.  Genitourinary:  Negative for dysuria, frequency, hematuria, penile pain, penile swelling, scrotal swelling and urgency.  Musculoskeletal:  Negative for back pain, myalgias and neck stiffness.  Skin:  Negative for rash.  Allergic/Immunologic: Negative for immunocompromised state.  Neurological:  Negative for dizziness, seizures, syncope, weakness,  numbness and headaches.  Psychiatric/Behavioral:  Negative for confusion.    Physical Exam Updated Vital Signs BP (!) 133/54   Pulse (!) 56   Temp 98 F (36.7 C) (Oral)   Resp 20   SpO2 99%   Physical Exam Vitals and nursing note reviewed.  Constitutional:      General: He is not in acute distress.    Appearance: He is well-developed. He is not ill-appearing, toxic-appearing or diaphoretic.  HENT:     Head: Normocephalic.  Eyes:     Conjunctiva/sclera: Conjunctivae normal.  Cardiovascular:     Rate and Rhythm: Normal rate and regular rhythm.     Heart sounds: No murmur heard. Pulmonary:     Effort: Pulmonary effort is normal.  Abdominal:     General: There is no distension.     Palpations: Abdomen is soft.  There is no mass.     Tenderness: There is abdominal tenderness. There is no right CVA tenderness, left CVA tenderness, guarding or rebound.     Hernia: No hernia is present.     Comments: Tender to palpation in the epigastric region and right upper quadrant.  Negative Murphy sign.  No palpable masses.  No rebound or guarding.  There are multiple well-healed surgical scars noted throughout the abdomen with no wound dehiscence.  Hypoactive bowel sounds in all 4 quadrants.  No tenderness over McBurney's point.  No CVA tenderness bilaterally.  Musculoskeletal:     Cervical back: Neck supple.     Right lower leg: No edema.     Left lower leg: No edema.  Skin:    General: Skin is warm and dry.  Neurological:     Mental Status: He is alert.  Psychiatric:        Behavior: Behavior normal.    ED Results / Procedures / Treatments   Labs (all labs ordered are listed, but only abnormal results are displayed) Labs Reviewed  CBC WITH DIFFERENTIAL/PLATELET - Abnormal; Notable for the following components:      Result Value   Lymphs Abs 0.6 (*)    All other components within normal limits  URINALYSIS, ROUTINE W REFLEX MICROSCOPIC - Abnormal; Notable for the following  components:   APPearance HAZY (*)    All other components within normal limits  COMPREHENSIVE METABOLIC PANEL - Abnormal; Notable for the following components:   Glucose, Bld 149 (*)    All other components within normal limits  LIPASE, BLOOD    EKG None  Radiology No results found.  Procedures Procedures   Medications Ordered in ED Medications  iohexol (OMNIPAQUE) 350 MG/ML injection 100 mL (has no administration in time range)  morphine 4 MG/ML injection 4 mg (4 mg Intravenous Given 04/21/21 0426)  ondansetron (ZOFRAN) injection 4 mg (4 mg Intravenous Given 04/21/21 0426)    ED Course  I have reviewed the triage vital signs and the nursing notes.  Pertinent labs & imaging results that were available during my care of the patient were reviewed by me and considered in my medical decision making (see chart for details).    MDM Rules/Calculators/A&P                           55 year old male with a history of colon cancer s/p resection, gastric sleeve, and ventral hernia, HTN, and partial small bowel obstruction who presents the emergency department with a chief complaint of abdominal pain, nausea, retching.  Earlier, he reports increased flatus, but now he states that he cannot pass flatus.  His symptoms feel similar to previous partial bowel obstruction.  Vital signs are stable.  On exam, patient has epigastric tenderness and tenderness palpation of the right upper quadrant.  No peritoneal signs.  Labs and imaging have been reviewed and independently interpreted by me.  CBC is unremarkable.  No metabolic derangements.  UA is unremarkable.  CT abdomen pelvis is pending.  Patient care transferred to [facility] at the end of my shift pending CT. Patient presentation, ED course, and plan of care discussed with review of all pertinent labs and imaging. Please see his/her note for further details regarding further ED course and disposition.   Final Clinical Impression(s) / ED  Diagnoses Final diagnoses:  None    Rx / DC Orders ED Discharge Orders     None  Joanne Gavel, PA-C 04/21/21 [id number]    Orpah Greek, MD 04/21/21 [phone]

## 2021-04-21 NOTE — ED Notes (Signed)
IV team at bedside 

## 2021-04-21 NOTE — Consult Note (Addendum)
Tish Frederickson 03-Dec-1965  BY:[id number].    Requesting MD: Dr. Hal Hope Chief Complaint/Reason for Consult: SBO  HPI: Cameron Jones is a 55 y.o. male with multiple prior abdominal surgeries as listed below who presented to [facility] ED with abdominal pain.  Patient reports yesterday evening around 730 he ate empanadas.  Shortly after he began having constant pain in his periumbilical area.  He had a normal BM.  He reports that after this he began having worsening pain with associated nausea and dry heaves.  He has not passed any flatus since this time. He tried Pepto, soda water and Percocet for symptoms without any relief.  He reports this feels similar to when he had a SBO in Dec 2021 that resolved with conservative management.  In the ED he was found to have mid/distal small bowel obstruction due to strictured narrowing or adhesive process along the right side of the abdominal wall hernia.  This appears similar to prior SBO CT scan.   Patient reports she has a history of prior R colectomy for colon cancer in 2007 by Dr. Dalbert Batman, gastric banding (2013), Lap LOA, removal of gastric band and gastric sleeve surgery by Dr. Hassell Done in 2020 and hernia repair with vicryl mesh. Per patient he reports he has lost over 100lbs since his gastric surgery and is working to lose another 50lbs before having a ventral hernia operation that he has been following with our office for.    ROS: Review of Systems  Respiratory:  Negative for shortness of breath.   Cardiovascular:  Negative for chest pain.  Gastrointestinal:  Positive for abdominal pain and nausea. Negative for constipation, diarrhea and vomiting.  All other systems reviewed and are negative.  Family History  Problem Relation Age of Onset   Cancer Mother        pancreatic   Cancer Brother        colon   Alzheimer's disease Father    Hypertension Sister    Cancer Maternal Aunt        breast    Past Medical History:  Diagnosis  Date   Arthritis    Knee   Cancer ([place])    colon   Carpal tunnel syndrome of right wrist    resolved    Fatty liver    per Dr Radford Pax note   GERD (gastroesophageal reflux disease)    Hernia    History of blood transfusion    Hypertension    Lower back pain    Obesity    Sleep apnea    severe per report 12/11  on chart   Ventral hernia     Past Surgical History:  Procedure Laterality Date   BREATH Eliezer Champagne  11/07/2011   Procedure: BREATH TEK H PYLORI;  Surgeon: Shann Medal, MD;  Location: [facility];  Service: General;  Laterality: N/A;   BREATH TEK H PYLORI  01/02/2012   Procedure: Lauris Chroman;  Surgeon: Pedro Earls, MD;  Location: [facility];  Service: General;  Laterality: N/A;   COLON SURGERY  06/2006   colectomy   COLONOSCOPY N/A 05/13/2013   Procedure: COLONOSCOPY;  Surgeon: Winfield Cunas., MD;  Location: [facility];  Service: Endoscopy;  Laterality: N/A;   COLONOSCOPY N/A 08/30/2017   Procedure: COLONOSCOPY;  Surgeon: Laurence Spates, MD;  Location: [facility];  Service: Endoscopy;  Laterality: N/A;   INGUINAL HERNIA REPAIR N/A 08/25/2019   Procedure: HERNIA REPAIR INCARCERATED;  Surgeon:  Johnathan Hausen, MD;  Location: [facility];  Service: General;  Laterality: N/A;   LAPAROSCOPIC GASTRIC BAND REMOVAL WITH LAPAROSCOPIC GASTRIC SLEEVE RESECTION N/A 08/25/2019   Procedure: LAPAROSCOPIC GASTRIC BAND REMOVAL WITH LAPAROSCOPIC GASTRIC SLEEVE RESECTION, Upper Endo, ERAS Pathway;  Surgeon: Johnathan Hausen, MD;  Location: [facility];  Service: General;  Laterality: N/A;   LAPAROSCOPIC GASTRIC BANDING  05/07/2012   Procedure: LAPAROSCOPIC GASTRIC BANDING;  Surgeon: Pedro Earls, MD;  Location: [facility];  Service: General;  Laterality: N/A;   LAPAROSCOPIC LYSIS OF ADHESIONS N/A 08/25/2019   Procedure: LAPAROSCOPIC LYSIS OF ADHESIONS;  Surgeon: Johnathan Hausen, MD;  Location: [facility];  Service: General;  Laterality: N/A;    Social History:  reports that he has  never smoked. He has never used smokeless tobacco. He reports previous alcohol use of about 1.0 standard drink of alcohol per week. He reports that he does not use drugs. 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     amLODipine (NORVASC) 10 MG tablet Take 10 mg by mouth daily.     calcium carbonate (OS-CAL) 1250 (500 Ca) MG chewable tablet Chew 1 tablet by mouth daily.     cholecalciferol (VITAMIN D3) 25 MCG (1000 UT) tablet Take 1,000 Units by mouth daily.     ferrous sulfate 325 (65 FE) MG tablet Take 325 mg by mouth daily with breakfast.     Multiple Vitamins-Minerals (MULTIVITAMIN WITH MINERALS) tablet Take 1 tablet by mouth daily.      vitamin B-12 (CYANOCOBALAMIN) 1000 MCG tablet Take 1,000 mcg by mouth daily.       Physical Exam: Blood pressure (!) 166/89, pulse (!) 53, temperature 98.9 F (37.2 C), temperature source Oral, resp. rate 18, height '5\' 11"'$  (1.803 m), weight (!) 149.7 kg, SpO2 100 %. General: pleasant, WD/WN AA male who is laying in bed in NAD HEENT: head is normocephalic, atraumatic.  Sclera are noninjected.  PERRL.  Ears and nose without any masses or lesions.  Mouth is pink and moist. Dentition fair Heart: regular, rate, and rhythm.  Normal s1,s2. No obvious murmurs, gallops, or rubs noted.  Palpable pedal pulses bilaterally  Lungs: CTAB, no wheezes, rhonchi, or rales noted.  Respiratory effort nonlabored Abd: Obese, soft, mild distension, tenderness of the periumbilical abdomen, large ventral hernia, hypoactive BS. Prior abdominal (ex lap and laparoscopic) scars are well healed. No masses or organomegaly MS: no BUE/BLE edema, calves soft and nontender Skin: warm and dry with no masses, lesions, or rashes Psych: A&Ox4  with an appropriate affect Neuro: cranial nerves grossly intact, equal strength in BUE/BLE bilaterally, normal speech, thought process intact, moves all extremities, gait not assessed   Results for orders placed or performed during the hospital encounter of 04/21/21 (from the past 48 hour(s))  Urinalysis, Routine w reflex microscopic Urine, Clean Catch     Status: Abnormal   Collection Time: 04/21/21  4:04 AM  Result Value Ref Range   Color, Urine YELLOW YELLOW   APPearance HAZY (A) CLEAR   Specific Gravity, Urine 1.020 1.005 - 1.030   pH 7.0 5.0 - 8.0   Glucose, UA NEGATIVE NEGATIVE mg/dL   Hgb urine dipstick NEGATIVE NEGATIVE   Bilirubin Urine NEGATIVE NEGATIVE   Ketones, ur NEGATIVE NEGATIVE mg/dL  Protein, ur NEGATIVE NEGATIVE mg/dL   Nitrite NEGATIVE NEGATIVE   Leukocytes,Ua NEGATIVE NEGATIVE    Comment: Performed at [facility] [address], [place], [place] [address]  CBC with Differential     Status: Abnormal   Collection Time: 04/21/21  4:20 AM  Result Value Ref Range   WBC 7.7 4.0 - 10.5 K/uL   RBC 5.61 4.22 - 5.81 MIL/uL   Hemoglobin 16.3 13.0 - 17.0 g/dL   HCT 50.7 39.0 - 52.0 %   MCV 90.4 80.0 - 100.0 fL   MCH 29.1 26.0 - 34.0 pg   MCHC 32.1 30.0 - 36.0 g/dL   RDW 13.5 11.5 - 15.5 %   Platelets 175 150 - 400 K/uL   nRBC 0.0 0.0 - 0.2 %   Neutrophils Relative % 88 %   Neutro Abs 6.9 1.7 - 7.7 K/uL   Lymphocytes Relative 8 %   Lymphs Abs 0.6 (L) 0.7 - 4.0 K/uL   Monocytes Relative 3 %   Monocytes Absolute 0.2 0.1 - 1.0 K/uL   Eosinophils Relative 0 %   Eosinophils Absolute 0.0 0.0 - 0.5 K/uL   Basophils Relative 0 %   Basophils Absolute 0.0 0.0 - 0.1 K/uL   Immature Granulocytes 1 %   Abs Immature Granulocytes 0.04 0.00 - 0.07 K/uL    Comment: Performed at [facility], [place] [address] [place], [place] [address]  Comprehensive metabolic panel     Status: Abnormal   Collection Time: 04/21/21  5:06 AM  Result Value  Ref Range   Sodium 141 135 - 145 mmol/L   Potassium 4.3 3.5 - 5.1 mmol/L   Chloride 106 98 - 111 mmol/L   CO2 26 22 - 32 mmol/L   Glucose, Bld 149 (H) 70 - 99 mg/dL    Comment: Glucose reference range applies only to samples taken after fasting for at least 8 hours.   BUN 12 6 - 20 mg/dL   Creatinine, Ser 0.88 0.61 - 1.24 mg/dL   Calcium 9.6 8.9 - 10.3 mg/dL   Total Protein 7.1 6.5 - 8.1 g/dL   Albumin 4.2 3.5 - 5.0 g/dL   AST 28 15 - 41 U/L   ALT 40 0 - 44 U/L   Alkaline Phosphatase 45 38 - 126 U/L   Total Bilirubin 0.6 0.3 - 1.2 mg/dL   GFR, Estimated >60 >60 mL/min    Comment: (NOTE) Calculated using the CKD-EPI Creatinine Equation (2021)    Anion gap 9 5 - 15    Comment: Performed at [facility], [place] [address], [place], [place] [address]  Lipase, blood     Status: None   Collection Time: 04/21/21  5:06 AM  Result Value Ref Range   Lipase 28 11 - 51 U/L    Comment: Performed at [facility], [place] [address] [place],  [address]  Resp Panel by RT-PCR (Flu A&B, Covid) Nasopharyngeal Swab     Status: None   Collection Time: 04/21/21 11:46 AM   Specimen: Nasopharyngeal Swab; Nasopharyngeal(NP) swabs in vial transport medium  Result Value Ref Range   SARS Coronavirus 2 by RT PCR NEGATIVE NEGATIVE    Comment: (NOTE) SARS-CoV-2 target nucleic acids are NOT DETECTED.  The SARS-CoV-2 RNA is generally detectable in upper respiratory specimens during the acute phase of infection. The lowest concentration of SARS-CoV-2 viral copies this assay can detect is 138 copies/mL. A negative result does not preclude SARS-Cov-2 infection and should  not be used as the sole basis for treatment or other patient management decisions. A negative result may occur with  improper specimen collection/handling, submission of specimen other than nasopharyngeal swab, presence of viral mutation(s) within the areas targeted by this assay, and inadequate number  of viral copies(<138 copies/mL). A negative result must be combined with clinical observations, patient history, and epidemiological information. The expected result is Negative.  Fact Sheet for Patients:  EntrepreneurPulse.com.au  Fact Sheet for Healthcare Providers:  IncredibleEmployment.be  This test is no t yet approved or cleared by the [place] FDA and  has been authorized for detection and/or diagnosis of SARS-CoV-2 by FDA under an Emergency Use Authorization (EUA). This EUA will remain  in effect (meaning this test can be used) for the duration of the COVID-19 declaration under Section 564(b)(1) of the Act, 21 U.S.C.section 360bbb-3(b)(1), unless the authorization is terminated  or revoked sooner.       Influenza A by PCR NEGATIVE NEGATIVE   Influenza B by PCR NEGATIVE NEGATIVE    Comment: (NOTE) The Xpert Xpress SARS-CoV-2/FLU/RSV plus assay is intended as an aid in the diagnosis of influenza from Nasopharyngeal swab specimens and should not be used as a sole basis for treatment. Nasal washings and aspirates are unacceptable for Xpert Xpress SARS-CoV-2/FLU/RSV testing.  Fact Sheet for Patients: EntrepreneurPulse.com.au  Fact Sheet for Healthcare Providers: IncredibleEmployment.be  This test is not yet approved or cleared by the [place] FDA and has been authorized for detection and/or diagnosis of SARS-CoV-2 by FDA under an Emergency Use Authorization (EUA). This EUA will remain in effect (meaning this test can be used) for the duration of the COVID-19 declaration under Section 564(b)(1) of the Act, 21 U.S.C. section 360bbb-3(b)(1), unless the authorization is terminated or revoked.  Performed at [facility], [place] [address] [place], [place] [address]    CT ABDOMEN PELVIS WO CONTRAST  Result Date: 04/21/2021 CLINICAL DATA:  Abdominal pain since last evening.  EXAM: CT ABDOMEN AND PELVIS WITHOUT CONTRAST TECHNIQUE: Multidetector CT imaging of the abdomen and pelvis was performed following the standard protocol without IV contrast. COMPARISON:  CT scan 08/25/2020 FINDINGS: Lower chest: The lung bases are clear of an acute process. Stable small Bochdalek's hernia noted on the left. The heart is normal in size. No pericardial effusion. Hepatobiliary: No hepatic lesions are identified without contrast. The gallbladder is grossly normal. No common bile duct dilatation. Pancreas: No mass, inflammation or ductal dilatation. Spleen: Normal size. No focal lesions. Adrenals/Urinary Tract: Stable left adrenal gland adenoma. The right adrenal gland is normal. No renal calculi, hydronephrosis or renal lesions are identified. Stomach/Bowel: Stable postoperative changes involving the stomach from gastric sleeve procedure. No complicating features. The duodenum is unremarkable. Dilated mid distal small bowel loops with air-fluid levels consistent with obstruction. Findings due to strictured narrowing or adhesive process along the right-side of the abdominal wall hernia. There are non dilated small bowel loops and draining hernia on the right side. This is the same location of obstruction noted on the prior CT scan. Part of the transverse colon is also in the anterior abdominal wall hernia and is slightly distended and contains moderate stool. There are postoperative changes from a right hemicolectomy with ileocolonic anastomosis involving the colon in the hernia. Vascular/Lymphatic: Minimal scattered vascular calcifications. No aneurysm. No mesenteric or retroperitoneal mass or adenopathy. Reproductive: The prostate gland and seminal vesicles are unremarkable. Other: No pelvic mass or adenopathy. No free pelvic fluid collections. No inguinal mass or  adenopathy. Musculoskeletal: No significant bony findings. IMPRESSION: 1. CT findings consistent with a mid distal small bowel obstruction  due to strictured narrowing or adhesive process along the right-side of the abdominal wall hernia. This is the same location of obstruction noted on the prior CT scan. 2. Status post right hemicolectomy with ileocolonic anastomosis involving the colon in the hernia. 3. Stable postoperative changes involving the stomach from gastric sleeve procedure. 4. Stable left adrenal gland adenoma. Electronically Signed   By: Marijo Sanes M.D.   On: 04/21/2021 10:42    Anti-infectives (From admission, onward)    None       Assessment/Plan SBO Ventral Hernia  - CT w/ mid/distal small bowel obstruction due to strictured narrowing or adhesive process along the right side of the abdominal wall hernia.  This appears similar to prior SBO CT scan.  - Patient reports she has a history of prior R colectomy for colon cancer in 2007 by Dr. Dalbert Batman, gastric banding (2013), Lap LOA, removal of gastric band and gastric sleeve surgery by Dr. Hassell Done in 2020 and hernia repair with vicryl mesh.  - Will review scan with MD. Appears similar to CT scan in 2021 when he had SBO and improved with conservative therapy.  - NPO, NGT, SBO protocol - Keep K > 4 and Mg>2 for bowel function - Mobilize for bowel function  FEN - NPO, NGT, IVF per [facility] VTE - SCDs, okay for chemical prophylaxis from a general surgery standpoint ID - None  Cameron Jones, [facility] Surgery 04/21/2021, 2:27 PM Please see Amion for pager number during day hours 7:00am-4:30pm

## 2021-04-21 NOTE — ED Provider Notes (Signed)
Received signout from previous provider, please see her note for complete H&P.  Patient with history of colon cancer s/p resection and history of small bowel structure in the past.  He is here with abdominal pain felt similar to prior SBO.  There was a delay in obtaining CT scan as patient was reportedly has difficult IV access and was waiting for IV team.  I was successfully able to insert an peripheral IV with ultrasound guidance.  Will provide additional pain medication and will obtain CT scan for further care.  Ultrasound ED Peripheral IV (Provider)  Date/Time: 04/21/2021 9:33 AM Performed by: Domenic Moras, PA-C Authorized by: Domenic Moras, PA-C   Procedure details:    Indications: multiple failed IV attempts and poor IV access     Skin Prep: chlorhexidine gluconate     Location: right basilic.   Angiocath:  20 G   Bedside Ultrasound Guided: Yes     Images: not archived     Patient tolerated procedure without complications: Yes     Dressing applied: Yes    Despite IV access appears to be full functional after my placement, once patient went to the ED CT scanner, radiologist tech notified that the IV access has failed and caused extravasation.  Appropriate care provided, additional pain medication given.  CT scan today demonstrates finding consistent with missed distal small bowel obstruction due to strictures narrowing or adhesive process along the right side of the abdominal wall hernia.  This is the same location of obstruction noted in prior CT scan.  11:37 AM Appreciate consultation from General Surgery Dr. Michaelle Copas who request for medicine admission and he'll be available for further management.    12:02 PM I have consulted [facility] DR. Derald Macleod who agrees to see and will admit pt for further care.    BP (!) 156/84   Pulse (!) 56   Temp 98 F (36.7 C) (Oral)   Resp 18   SpO2 99%   Results for orders placed or performed during the hospital encounter of  04/21/21  CBC with Differential  Result Value Ref Range   WBC 7.7 4.0 - 10.5 K/uL   RBC 5.61 4.22 - 5.81 MIL/uL   Hemoglobin 16.3 13.0 - 17.0 g/dL   HCT 50.7 39.0 - 52.0 %   MCV 90.4 80.0 - 100.0 fL   MCH 29.1 26.0 - 34.0 pg   MCHC 32.1 30.0 - 36.0 g/dL   RDW 13.5 11.5 - 15.5 %   Platelets 175 150 - 400 K/uL   nRBC 0.0 0.0 - 0.2 %   Neutrophils Relative % 88 %   Neutro Abs 6.9 1.7 - 7.7 K/uL   Lymphocytes Relative 8 %   Lymphs Abs 0.6 (L) 0.7 - 4.0 K/uL   Monocytes Relative 3 %   Monocytes Absolute 0.2 0.1 - 1.0 K/uL   Eosinophils Relative 0 %   Eosinophils Absolute 0.0 0.0 - 0.5 K/uL   Basophils Relative 0 %   Basophils Absolute 0.0 0.0 - 0.1 K/uL   Immature Granulocytes 1 %   Abs Immature Granulocytes 0.04 0.00 - 0.07 K/uL  Urinalysis, Routine w reflex microscopic Urine, Clean Catch  Result Value Ref Range   Color, Urine YELLOW YELLOW   APPearance HAZY (A) CLEAR   Specific Gravity, Urine 1.020 1.005 - 1.030   pH 7.0 5.0 - 8.0   Glucose, UA NEGATIVE NEGATIVE mg/dL   Hgb urine dipstick NEGATIVE NEGATIVE   Bilirubin Urine NEGATIVE NEGATIVE   Ketones, ur  NEGATIVE NEGATIVE mg/dL   Protein, ur NEGATIVE NEGATIVE mg/dL   Nitrite NEGATIVE NEGATIVE   Leukocytes,Ua NEGATIVE NEGATIVE  Comprehensive metabolic panel  Result Value Ref Range   Sodium 141 135 - 145 mmol/L   Potassium 4.3 3.5 - 5.1 mmol/L   Chloride 106 98 - 111 mmol/L   CO2 26 22 - 32 mmol/L   Glucose, Bld 149 (H) 70 - 99 mg/dL   BUN 12 6 - 20 mg/dL   Creatinine, Ser 0.88 0.61 - 1.24 mg/dL   Calcium 9.6 8.9 - 10.3 mg/dL   Total Protein 7.1 6.5 - 8.1 g/dL   Albumin 4.2 3.5 - 5.0 g/dL   AST 28 15 - 41 U/L   ALT 40 0 - 44 U/L   Alkaline Phosphatase 45 38 - 126 U/L   Total Bilirubin 0.6 0.3 - 1.2 mg/dL   GFR, Estimated >60 >60 mL/min   Anion gap 9 5 - 15  Lipase, blood  Result Value Ref Range   Lipase 28 11 - 51 U/L   CT ABDOMEN PELVIS WO CONTRAST  Result Date: 04/21/2021 CLINICAL DATA:  Abdominal pain  since last evening. EXAM: CT ABDOMEN AND PELVIS WITHOUT CONTRAST TECHNIQUE: Multidetector CT imaging of the abdomen and pelvis was performed following the standard protocol without IV contrast. COMPARISON:  CT scan 08/25/2020 FINDINGS: Lower chest: The lung bases are clear of an acute process. Stable small Bochdalek's hernia noted on the left. The heart is normal in size. No pericardial effusion. Hepatobiliary: No hepatic lesions are identified without contrast. The gallbladder is grossly normal. No common bile duct dilatation. Pancreas: No mass, inflammation or ductal dilatation. Spleen: Normal size. No focal lesions. Adrenals/Urinary Tract: Stable left adrenal gland adenoma. The right adrenal gland is normal. No renal calculi, hydronephrosis or renal lesions are identified. Stomach/Bowel: Stable postoperative changes involving the stomach from gastric sleeve procedure. No complicating features. The duodenum is unremarkable. Dilated mid distal small bowel loops with air-fluid levels consistent with obstruction. Findings due to strictured narrowing or adhesive process along the right-side of the abdominal wall hernia. There are non dilated small bowel loops and draining hernia on the right side. This is the same location of obstruction noted on the prior CT scan. Part of the transverse colon is also in the anterior abdominal wall hernia and is slightly distended and contains moderate stool. There are postoperative changes from a right hemicolectomy with ileocolonic anastomosis involving the colon in the hernia. Vascular/Lymphatic: Minimal scattered vascular calcifications. No aneurysm. No mesenteric or retroperitoneal mass or adenopathy. Reproductive: The prostate gland and seminal vesicles are unremarkable. Other: No pelvic mass or adenopathy. No free pelvic fluid collections. No inguinal mass or adenopathy. Musculoskeletal: No significant bony findings. IMPRESSION: 1. CT findings consistent with a mid distal small  bowel obstruction due to strictured narrowing or adhesive process along the right-side of the abdominal wall hernia. This is the same location of obstruction noted on the prior CT scan. 2. Status post right hemicolectomy with ileocolonic anastomosis involving the colon in the hernia. 3. Stable postoperative changes involving the stomach from gastric sleeve procedure. 4. Stable left adrenal gland adenoma. Electronically Signed   By: Marijo Sanes M.D.   On: 04/21/2021 10:42      Domenic Moras, PA-C 04/21/21 1203    Hayden Rasmussen, MD 04/21/21 [phone]

## 2021-04-22 ENCOUNTER — Inpatient Hospital Stay (HOSPITAL_COMMUNITY): Payer: BC Managed Care – PPO | Admitting: Anesthesiology

## 2021-04-22 ENCOUNTER — Inpatient Hospital Stay (HOSPITAL_COMMUNITY): Payer: BC Managed Care – PPO

## 2021-04-22 ENCOUNTER — Inpatient Hospital Stay: Admit: 2021-04-22 | Payer: BC Managed Care – PPO | Admitting: Surgery

## 2021-04-22 ENCOUNTER — Encounter (HOSPITAL_COMMUNITY)
Admission: EM | Disposition: A | Discharge status: Home / Self Care | Payer: Self-pay | Source: Home / Self Care | Attending: Internal Medicine

## 2021-04-22 ENCOUNTER — Encounter (HOSPITAL_COMMUNITY): Payer: Self-pay | Admitting: Internal Medicine

## 2021-04-22 DIAGNOSIS — K56609 Unspecified intestinal obstruction, unspecified as to partial versus complete obstruction: Secondary | ICD-10-CM | POA: Diagnosis not present

## 2021-04-22 DIAGNOSIS — I10 Essential (primary) hypertension: Secondary | ICD-10-CM | POA: Diagnosis not present

## 2021-04-22 HISTORY — PX: LAPAROTOMY: SHX154

## 2021-04-22 LAB — CBC
HCT: 53 % — ABNORMAL HIGH (ref 39.0–52.0)
Hemoglobin: 17.3 g/dL — ABNORMAL HIGH (ref 13.0–17.0)
MCH: 29.2 pg (ref 26.0–34.0)
MCHC: 32.6 g/dL (ref 30.0–36.0)
MCV: 89.4 fL (ref 80.0–100.0)
Platelets: 209 10*3/uL (ref 150–400)
RBC: 5.93 MIL/uL — ABNORMAL HIGH (ref 4.22–5.81)
RDW: 13.5 % (ref 11.5–15.5)
WBC: 11.4 10*3/uL — ABNORMAL HIGH (ref 4.0–10.5)
nRBC: 0 % (ref 0.0–0.2)

## 2021-04-22 LAB — GLUCOSE, CAPILLARY
Glucose-Capillary: 121 mg/dL — ABNORMAL HIGH (ref 70–99)
Glucose-Capillary: 101 mg/dL — ABNORMAL HIGH (ref 70–99)

## 2021-04-22 LAB — BASIC METABOLIC PANEL
Anion gap: 11 (ref 5–15)
BUN: 10 mg/dL (ref 6–20)
CO2: 26 mmol/L (ref 22–32)
Calcium: 9.4 mg/dL (ref 8.9–10.3)
Chloride: 103 mmol/L (ref 98–111)
Creatinine, Ser: 1.05 mg/dL (ref 0.61–1.24)
GFR, Estimated: >60 mL/min (ref >60)
Glucose, Bld: 148 mg/dL — ABNORMAL HIGH (ref 70–99)
Potassium: 3.9 mmol/L (ref 3.5–5.1)
Sodium: 140 mmol/L (ref 135–145)

## 2021-04-22 LAB — SURGICAL PCR SCREEN
MRSA, PCR: NEGATIVE
Staphylococcus aureus: NEGATIVE

## 2021-04-22 SURGERY — LAPAROTOMY, EXPLORATORY
Anesthesia: General | Site: Abdomen

## 2021-04-22 MED ORDER — BUPIVACAINE-EPINEPHRINE (PF) 0.25% -1:200000 IJ SOLN
INTRAMUSCULAR | Status: AC
  Filled 2021-04-22: qty 30

## 2021-04-22 MED ORDER — 0.9 % SODIUM CHLORIDE (POUR BTL) OPTIME
TOPICAL | Status: DC | PRN
  Administered 2021-04-22: 3000 mL

## 2021-04-22 MED ORDER — BUPIVACAINE LIPOSOME 1.3 % IJ SUSP
INTRAMUSCULAR | Status: DC | PRN
  Administered 2021-04-22: 20 mL

## 2021-04-22 MED ORDER — PHENYLEPHRINE 40 MCG/ML (10ML) SYRINGE FOR IV PUSH (FOR BLOOD PRESSURE SUPPORT)
PREFILLED_SYRINGE | INTRAVENOUS | Status: DC | PRN
  Administered 2021-04-22: 120 ug via INTRAVENOUS
  Administered 2021-04-22: 80 ug via INTRAVENOUS
  Administered 2021-04-22: 120 ug via INTRAVENOUS

## 2021-04-22 MED ORDER — FENTANYL CITRATE (PF) 250 MCG/5ML IJ SOLN
INTRAMUSCULAR | Status: AC
  Filled 2021-04-22: qty 5

## 2021-04-22 MED ORDER — METHOCARBAMOL 1000 MG/10ML IJ SOLN
500.0000 mg | Freq: Four times a day (QID) | INTRAVENOUS | Status: DC | PRN
  Filled 2021-04-22: qty 5

## 2021-04-22 MED ORDER — LACTATED RINGERS IV SOLN: INTRAVENOUS | Status: DC

## 2021-04-22 MED ORDER — ALBUMIN HUMAN 5 % IV SOLN
INTRAVENOUS | Status: AC
  Filled 2021-04-22: qty 500

## 2021-04-22 MED ORDER — PHENYLEPHRINE 40 MCG/ML (10ML) SYRINGE FOR IV PUSH (FOR BLOOD PRESSURE SUPPORT)
PREFILLED_SYRINGE | INTRAVENOUS | Status: AC
  Filled 2021-04-22: qty 10

## 2021-04-22 MED ORDER — METOCLOPRAMIDE HCL 5 MG/ML IJ SOLN
10.0000 mg | Freq: Four times a day (QID) | INTRAMUSCULAR | Status: DC
  Administered 2021-04-23 – 2021-04-26 (×15): 10 mg via INTRAVENOUS
  Filled 2021-04-22 (×16): qty 2

## 2021-04-22 MED ORDER — HYDROMORPHONE HCL 1 MG/ML IJ SOLN
1.0000 mg | INTRAMUSCULAR | Status: DC | PRN
Start: 2021-04-22 — End: 2021-04-27
  Administered 2021-04-23 – 2021-04-24 (×6): 1 mg via INTRAVENOUS
  Filled 2021-04-22 (×6): qty 1

## 2021-04-22 MED ORDER — HYDROMORPHONE HCL 1 MG/ML IJ SOLN
INTRAMUSCULAR | Status: AC
  Filled 2021-04-22: qty 1

## 2021-04-22 MED ORDER — CHLORHEXIDINE GLUCONATE 0.12 % MT SOLN: 15.0000 mL | Freq: Once | OROMUCOSAL | Status: DC

## 2021-04-22 MED ORDER — MIDAZOLAM HCL 2 MG/2ML IJ SOLN
INTRAMUSCULAR | Status: AC
  Filled 2021-04-22: qty 2

## 2021-04-22 MED ORDER — LACTATED RINGERS IV SOLN: INTRAVENOUS | Status: DC | PRN

## 2021-04-22 MED ORDER — SUGAMMADEX SODIUM 500 MG/5ML IV SOLN
INTRAVENOUS | Status: AC
  Filled 2021-04-22: qty 5

## 2021-04-22 MED ORDER — DEXMEDETOMIDINE (PRECEDEX) IN NS 20 MCG/5ML (4 MCG/ML) IV SYRINGE
PREFILLED_SYRINGE | INTRAVENOUS | Status: AC
  Filled 2021-04-22: qty 5

## 2021-04-22 MED ORDER — DEXAMETHASONE SODIUM PHOSPHATE 10 MG/ML IJ SOLN
INTRAMUSCULAR | Status: DC | PRN
  Administered 2021-04-22: 10 mg via INTRAVENOUS

## 2021-04-22 MED ORDER — LIDOCAINE 2% (20 MG/ML) 5 ML SYRINGE
INTRAMUSCULAR | Status: DC | PRN
  Administered 2021-04-22: 100 mg via INTRAVENOUS

## 2021-04-22 MED ORDER — PROCHLORPERAZINE EDISYLATE 10 MG/2ML IJ SOLN: 10.0000 mg | INTRAMUSCULAR | Status: DC | PRN

## 2021-04-22 MED ORDER — LIDOCAINE 2% (20 MG/ML) 5 ML SYRINGE
INTRAMUSCULAR | Status: AC
  Filled 2021-04-22: qty 5

## 2021-04-22 MED ORDER — OXYCODONE HCL 5 MG PO TABS
10.0000 mg | ORAL_TABLET | ORAL | Status: DC | PRN
  Administered 2021-04-24 – 2021-04-25 (×4): 10 mg
  Filled 2021-04-22 (×4): qty 2

## 2021-04-22 MED ORDER — PROPOFOL 10 MG/ML IV BOLUS
INTRAVENOUS | Status: AC
  Filled 2021-04-22: qty 20

## 2021-04-22 MED ORDER — OXYCODONE HCL 5 MG/5ML PO SOLN
5.0000 mg | Freq: Once | ORAL | Status: DC | PRN
Start: 2021-04-22 — End: 2021-04-22

## 2021-04-22 MED ORDER — DEXAMETHASONE SODIUM PHOSPHATE 10 MG/ML IJ SOLN
INTRAMUSCULAR | Status: AC
  Filled 2021-04-22: qty 1

## 2021-04-22 MED ORDER — KETAMINE HCL 10 MG/ML IJ SOLN
INTRAMUSCULAR | Status: AC
  Filled 2021-04-22: qty 1

## 2021-04-22 MED ORDER — GABAPENTIN 250 MG/5ML PO SOLN
300.0000 mg | Freq: Three times a day (TID) | ORAL | Status: DC
  Administered 2021-04-22 – 2021-04-23 (×4): 300 mg
  Filled 2021-04-22 (×6): qty 6

## 2021-04-22 MED ORDER — ACETAMINOPHEN 10 MG/ML IV SOLN: 1000.0000 mg | Freq: Once | INTRAVENOUS | Status: DC | PRN

## 2021-04-22 MED ORDER — FENTANYL CITRATE (PF) 250 MCG/5ML IJ SOLN
INTRAMUSCULAR | Status: DC | PRN
  Administered 2021-04-22: 150 ug via INTRAVENOUS
  Administered 2021-04-22 (×4): 50 ug via INTRAVENOUS

## 2021-04-22 MED ORDER — MIDAZOLAM HCL 2 MG/2ML IJ SOLN
INTRAMUSCULAR | Status: DC | PRN
  Administered 2021-04-22: 2 mg via INTRAVENOUS

## 2021-04-22 MED ORDER — HYDROMORPHONE HCL 1 MG/ML IJ SOLN
0.2500 mg | INTRAMUSCULAR | Status: DC | PRN
  Administered 2021-04-22 (×4): 0.5 mg via INTRAVENOUS

## 2021-04-22 MED ORDER — AMISULPRIDE (ANTIEMETIC) 5 MG/2ML IV SOLN: 10.0000 mg | Freq: Once | INTRAVENOUS | Status: DC | PRN

## 2021-04-22 MED ORDER — SUCCINYLCHOLINE CHLORIDE 200 MG/10ML IV SOSY
PREFILLED_SYRINGE | INTRAVENOUS | Status: DC | PRN
Start: 2021-04-22 — End: 2021-04-22
  Administered 2021-04-22: 120 mg via INTRAVENOUS

## 2021-04-22 MED ORDER — CEFAZOLIN IN SODIUM CHLORIDE 3-0.9 GM/100ML-% IV SOLN
3.0000 g | Freq: Once | INTRAVENOUS | Status: DC
  Filled 2021-04-22: qty 100

## 2021-04-22 MED ORDER — ROCURONIUM BROMIDE 10 MG/ML (PF) SYRINGE
PREFILLED_SYRINGE | INTRAVENOUS | Status: AC
  Filled 2021-04-22: qty 10

## 2021-04-22 MED ORDER — DEXTROSE 5 % IV SOLN
3.0000 g | Freq: Once | INTRAVENOUS | Status: DC
  Administered 2021-04-22: 3 g via INTRAVENOUS
  Filled 2021-04-22 (×2): qty 3000

## 2021-04-22 MED ORDER — SUCCINYLCHOLINE CHLORIDE 200 MG/10ML IV SOSY
PREFILLED_SYRINGE | INTRAVENOUS | Status: AC
  Filled 2021-04-22: qty 10

## 2021-04-22 MED ORDER — SIMETHICONE 80 MG PO CHEW: 80.0000 mg | CHEWABLE_TABLET | Freq: Four times a day (QID) | ORAL | Status: DC | PRN

## 2021-04-22 MED ORDER — ALBUMIN HUMAN 5 % IV SOLN: INTRAVENOUS | Status: DC | PRN

## 2021-04-22 MED ORDER — ONDANSETRON HCL 4 MG/2ML IJ SOLN: 4.0000 mg | Freq: Once | INTRAMUSCULAR | Status: DC | PRN

## 2021-04-22 MED ORDER — KETAMINE HCL 10 MG/ML IJ SOLN
INTRAMUSCULAR | Status: DC | PRN
  Administered 2021-04-22: 30 mg via INTRAVENOUS
  Administered 2021-04-22: 20 mg via INTRAVENOUS
  Administered 2021-04-22: 30 mg via INTRAVENOUS

## 2021-04-22 MED ORDER — BUPIVACAINE LIPOSOME 1.3 % IJ SUSP
20.0000 mL | Freq: Once | INTRAMUSCULAR | Status: DC
  Filled 2021-04-22: qty 20

## 2021-04-22 MED ORDER — OXYCODONE HCL 5 MG PO TABS: 5.0000 mg | ORAL_TABLET | Freq: Once | ORAL | Status: DC | PRN

## 2021-04-22 MED ORDER — OXYCODONE HCL 5 MG PO TABS: 5.0000 mg | ORAL_TABLET | ORAL | Status: DC | PRN

## 2021-04-22 MED ORDER — FENTANYL CITRATE (PF) 100 MCG/2ML IJ SOLN
INTRAMUSCULAR | Status: AC
  Filled 2021-04-22: qty 2

## 2021-04-22 MED ORDER — PROPOFOL 10 MG/ML IV BOLUS
INTRAVENOUS | Status: DC | PRN
  Administered 2021-04-22: 200 mg via INTRAVENOUS

## 2021-04-22 MED ORDER — SUGAMMADEX SODIUM 500 MG/5ML IV SOLN
INTRAVENOUS | Status: DC | PRN
  Administered 2021-04-22: 400 mg via INTRAVENOUS

## 2021-04-22 MED ORDER — ROCURONIUM BROMIDE 10 MG/ML (PF) SYRINGE
PREFILLED_SYRINGE | INTRAVENOUS | Status: DC | PRN
  Administered 2021-04-22: 10 mg via INTRAVENOUS
  Administered 2021-04-22: 50 mg via INTRAVENOUS
  Administered 2021-04-22 (×2): 20 mg via INTRAVENOUS
  Administered 2021-04-22 (×2): 10 mg via INTRAVENOUS
  Administered 2021-04-22: 20 mg via INTRAVENOUS

## 2021-04-22 MED ORDER — BUPIVACAINE-EPINEPHRINE (PF) 0.25% -1:200000 IJ SOLN
INTRAMUSCULAR | Status: DC | PRN
  Administered 2021-04-22: 20 mL

## 2021-04-22 MED ORDER — ACETAMINOPHEN 10 MG/ML IV SOLN
INTRAVENOUS | Status: AC
  Filled 2021-04-22: qty 100

## 2021-04-22 MED ORDER — ONDANSETRON HCL 4 MG/2ML IJ SOLN
INTRAMUSCULAR | Status: AC
  Filled 2021-04-22: qty 2

## 2021-04-22 MED ORDER — ACETAMINOPHEN 10 MG/ML IV SOLN
INTRAVENOUS | Status: DC | PRN
  Administered 2021-04-22: 1000 mg via INTRAVENOUS

## 2021-04-22 MED ORDER — DEXMEDETOMIDINE (PRECEDEX) IN NS 20 MCG/5ML (4 MCG/ML) IV SYRINGE
PREFILLED_SYRINGE | INTRAVENOUS | Status: DC | PRN
  Administered 2021-04-22: 8 ug via INTRAVENOUS
  Administered 2021-04-22: 4 ug via INTRAVENOUS
  Administered 2021-04-22: 8 ug via INTRAVENOUS

## 2021-04-22 MED ORDER — ONDANSETRON HCL 4 MG/2ML IJ SOLN
INTRAMUSCULAR | Status: DC | PRN
  Administered 2021-04-22: 4 mg via INTRAVENOUS

## 2021-04-22 SURGICAL SUPPLY — 50 items
ADH SKN CLS APL DERMABOND .7 (GAUZE/BANDAGES/DRESSINGS) ×1
APL PRP STRL LF DISP 70% ISPRP (MISCELLANEOUS) ×1
BAG COUNTER SPONGE SURGICOUNT (BAG) ×1 IMPLANT
BAG SPNG CNTER NS LX DISP (BAG) ×1
BINDER ABDOMINAL 12 ML 46-62 (SOFTGOODS) ×1 IMPLANT
BLADE EXTENDED COATED 6.5IN (ELECTRODE) ×1 IMPLANT
CHLORAPREP W/TINT 26 (MISCELLANEOUS) ×1 IMPLANT
COVER MAYO STAND STRL (DRAPES) ×2 IMPLANT
COVER SURGICAL LIGHT HANDLE (MISCELLANEOUS) ×2 IMPLANT
DERMABOND ADVANCED (GAUZE/BANDAGES/DRESSINGS) ×1
DERMABOND ADVANCED .7 DNX12 (GAUZE/BANDAGES/DRESSINGS) IMPLANT
DRAIN CHANNEL 19F RND (DRAIN) ×2 IMPLANT
DRAPE LAPAROSCOPIC ABDOMINAL (DRAPES) ×2 IMPLANT
DRSG OPSITE POSTOP 4X10 (GAUZE/BANDAGES/DRESSINGS) IMPLANT
DRSG OPSITE POSTOP 4X6 (GAUZE/BANDAGES/DRESSINGS) IMPLANT
DRSG OPSITE POSTOP 4X8 (GAUZE/BANDAGES/DRESSINGS) IMPLANT
ELECT REM PT RETURN 15FT ADLT (MISCELLANEOUS) ×2 IMPLANT
EVACUATOR SILICONE 100CC (DRAIN) ×2 IMPLANT
GAUZE 4X4 16PLY ~~LOC~~+RFID DBL (SPONGE) ×1 IMPLANT
GLOVE SURG ENC MOIS LTX SZ6 (GLOVE) ×4 IMPLANT
GLOVE SURG UNDER LTX SZ6.5 (GLOVE) ×4 IMPLANT
GOWN STRL REUS W/TWL LRG LVL3 (GOWN DISPOSABLE) ×2 IMPLANT
GOWN STRL REUS W/TWL XL LVL3 (GOWN DISPOSABLE) ×2 IMPLANT
KIT TURNOVER KIT A (KITS) ×2 IMPLANT
MESH SOFT 12X12IN BARD (Mesh General) ×1 IMPLANT
NEEDLE HYPO 22GX1.5 SAFETY (NEEDLE) ×1 IMPLANT
PACK GENERAL/GYN (CUSTOM PROCEDURE TRAY) ×2 IMPLANT
PENCIL SMOKE EVACUATOR (MISCELLANEOUS) IMPLANT
SPONGE DRAIN TRACH 4X4 STRL 2S (GAUZE/BANDAGES/DRESSINGS) ×1 IMPLANT
SPONGE T-LAP 18X18 ~~LOC~~+RFID (SPONGE) ×3 IMPLANT
STAPLER VISISTAT 35W (STAPLE) IMPLANT
SUT ETHILON 3 0 PS 1 (SUTURE) IMPLANT
SUT MNCRL AB 4-0 PS2 18 (SUTURE) ×2 IMPLANT
SUT NOVA T20/GS 25 (SUTURE) IMPLANT
SUT SILK 2 0 (SUTURE) ×2
SUT SILK 2 0 SH CR/8 (SUTURE) ×1 IMPLANT
SUT SILK 2-0 18XBRD TIE 12 (SUTURE) ×1 IMPLANT
SUT SILK 3 0 (SUTURE) ×2
SUT SILK 3 0 SH CR/8 (SUTURE) ×1 IMPLANT
SUT SILK 3-0 18XBRD TIE 12 (SUTURE) ×1 IMPLANT
SUT STRAFIX SYMMETRIC 1-0 24 (SUTURE) ×4
SUT VIC AB 2-0 CT1 18 (SUTURE) ×2 IMPLANT
SUT VIC AB 2-0 CT1 27 (SUTURE) ×4
SUT VIC AB 2-0 CT1 TAPERPNT 27 (SUTURE) IMPLANT
SUT VIC AB 2-0 SH 18 (SUTURE) ×1 IMPLANT
SUTURE STRAFIX SYMMETRC 1-0 24 (SUTURE) IMPLANT
SYR 20ML LL LF (SYRINGE) ×1 IMPLANT
TOWEL OR 17X26 10 PK STRL BLUE (TOWEL DISPOSABLE) ×1 IMPLANT
TOWEL OR NON WOVEN STRL DISP B (DISPOSABLE) ×2 IMPLANT
TRAY FOLEY MTR SLVR 16FR STAT (SET/KITS/TRAYS/PACK) ×2 IMPLANT

## 2021-04-22 NOTE — Anesthesia Procedure Notes (Signed)
Procedure Name: Intubation Date/Time: 04/22/2021 2:19 PM Performed by: Sharlette Dense, CRNA Pre-anesthesia Checklist: Patient identified, Emergency Drugs available, Suction available and Patient being monitored Patient Re-evaluated:Patient Re-evaluated prior to induction Oxygen Delivery Method: Circle system utilized Preoxygenation: Pre-oxygenation with 100% oxygen Induction Type: IV induction, Rapid sequence and Cricoid Pressure applied Laryngoscope Size: Miller and 3 Grade View: Grade I Tube type: Oral Tube size: 8.0 mm Number of attempts: 1 Airway Equipment and Method: Stylet Placement Confirmation: ETT inserted through vocal cords under direct vision, positive ETCO2 and breath sounds checked- equal and bilateral Secured at: 23 cm Tube secured with: Tape Dental Injury: Teeth and Oropharynx as per pre-operative assessment

## 2021-04-22 NOTE — Anesthesia Preprocedure Evaluation (Addendum)
Anesthesia Evaluation  Patient identified by MRN, date of birth, ID band Patient awake    Reviewed: Allergy & Precautions, NPO status , Patient's Chart, lab work & pertinent test results  Airway Mallampati: III  TM Distance: >3 FB Neck ROM: Full    Dental no notable dental hx. (+) Teeth Intact, Dental Advisory Given   Pulmonary sleep apnea and Continuous Positive Airway Pressure Ventilation ,    Pulmonary exam normal breath sounds clear to auscultation       Cardiovascular hypertension, Normal cardiovascular exam Rhythm:Regular Rate:Normal  08/19/19 echo Left ventricle cavity is normal in size. Mild concentric hypertrophy of  the left ventricle. Normal LV systolic function with EF 56%. Normal global  wall motion. Normal diastolic filling pattern.  No significant valvular abnormality.  Normal right atrial pressure.    Neuro/Psych    GI/Hepatic GERD  ,Colon CA   Endo/Other  Morbid obesity (BMI 46)  Renal/GU Lab Results      Component                Value               Date                      CREATININE               1.05                04/22/2021                BUN                      10                  04/22/2021                NA                       140                 04/22/2021                K                        3.9                 04/22/2021                CL                       103                 04/22/2021                CO2                      26                  04/22/2021                Musculoskeletal  (+) Arthritis ,   Abdominal (+) + obese,   Peds  Hematology Lab Results      Component                Value  Date                      WBC                      11.4 (H)            04/22/2021                HGB                      17.3 (H)            04/22/2021                HCT                      53.0 (H)            04/22/2021                MCV                      89.4                 04/22/2021                PLT                      209                 04/22/2021                Anesthesia Other Findings   Reproductive/Obstetrics                            Anesthesia Physical Anesthesia Plan  ASA: 3  Anesthesia Plan: General   Post-op Pain Management:    Induction: Intravenous  PONV Risk Score and Plan: 3 and Treatment may vary due to age or medical condition, Midazolam, Dexamethasone and Ondansetron  Airway Management Planned: Oral ETT  Additional Equipment: None  Intra-op Plan:   Post-operative Plan: Possible Post-op intubation/ventilation and Extubation in OR  Informed Consent:     Dental advisory given  Plan Discussed with: CRNA and Anesthesiologist  Anesthesia Plan Comments: (GA plus 2 (18g) + )       Anesthesia Quick Evaluation

## 2021-04-22 NOTE — Progress Notes (Signed)
PROGRESS NOTE    Cameron Jones  [id number] DOB: [birth year] DOA: 04/21/2021 PCP: Haywood Pao, MD    Brief Narrative:  55 y.o. male with history of colon cancer s/p resection, history of gastric sleeve surgery, hypertension, sleep apnea presents to the ER with complaint of abdominal pain with nausea but denies any vomiting.  Last bowel movement was more than 24 hours ago.  Abdominal pain is diffuse.  CT imaging had findings worrisome for SBO with transition point. General Surgery was consulted  Assessment & Plan:   Principal Problem:   SBO (small bowel obstruction) (HCC) Active Problems:   Obstructive sleep apnea of adult   Essential hypertension   Small bowel obstruction noted on CT scan  Transition zone noted on CT, personally reviewed General Surgery following NG in place this AM on suction Pt noted to have been taken to OR this afternoon. Will folllow up per Surgery recs Hypertension  Continue PRN IV hydralazine as needed History of sleep apnea on CPAP. History of colon cancer s/p resection and history of gastric sleeve surgery. Follow up per General Surgery   DVT prophylaxis: SCD's Code Status: Full Family Communication: Pt in room, family at bedside  Status is: Inpatient  Remains inpatient appropriate because:Inpatient level of care appropriate due to severity of illness  Dispo: The patient is from: Home              Anticipated d/c is to: Home              Patient currently is not medically stable to d/c.   Difficult to place patient No   Consultants:  General Surgery  Procedures:    Antimicrobials: Anti-infectives (From admission, onward)    Start     Dose/Rate Route Frequency Ordered Stop   04/22/21 1415  ceFAZolin (ANCEF) IVPB 3g/100 mL premix        3 g 200 mL/hr over 30 Minutes Intravenous  Once 04/22/21 1404     04/22/21 1400  ceFAZolin (ANCEF) 3 g in dextrose 5 % 50 mL IVPB  Status:  Discontinued        3 g 100 mL/hr over 30  Minutes Intravenous  Once 04/22/21 1356 04/22/21 1404       Subjective: Complaining of continued abd discomfort  Objective: Vitals:   04/22/21 0551 04/22/21 0932 04/22/21 1030 04/22/21 1315  BP: (!) 178/96 (!) 179/84 (!) 154/78 (!) 148/90  Pulse: (!) 53 (!) 49  (!) 59  Resp: '18 17  18  '$ Temp: 98.8 F (37.1 C) 98.8 F (37.1 C)  98.7 F (37.1 C)  TempSrc: Oral Oral  Oral  SpO2: 100% 99%  99%  Weight:      Height:        Intake/Output Summary (Last 24 hours) at 04/22/2021 1558 Last data filed at 04/22/2021 1545 Gross per 24 hour  Intake 2802.24 ml  Output 725 ml  Net 2077.24 ml   Filed Weights   04/21/21 1400  Weight: (!) 149.7 kg    Examination: General exam: Awake, laying in bed, in nad Respiratory system: Normal respiratory effort, no wheezing Cardiovascular system: regular rate, s1, s2 Gastrointestinal system: Soft, nondistended, decreased BS Central nervous system: CN2-12 grossly intact, strength intact Extremities: Perfused, no clubbing Skin: Normal skin turgor, no notable skin lesions seen Psychiatry: Mood normal // no visual hallucinations   Data Reviewed: I have personally reviewed following labs and imaging studies  CBC: Recent Labs  Lab 04/21/21 0420 04/22/21 0515  WBC 7.7  11.4*  NEUTROABS 6.9  --   HGB 16.3 17.3*  HCT 50.7 53.0*  MCV 90.4 89.4  PLT 175 [id number]   Basic Metabolic Panel: Recent Labs  Lab 04/21/21 0506 04/22/21 0515  NA 141 140  K 4.3 3.9  CL 106 103  CO2 26 26  GLUCOSE 149* 148*  BUN 12 10  CREATININE 0.88 1.05  CALCIUM 9.6 9.4   GFR: Estimated Creatinine Clearance: 120.9 mL/min (by C-G formula based on SCr of 1.05 mg/dL). Liver Function Tests: Recent Labs  Lab 04/21/21 0506  AST 28  ALT 40  ALKPHOS 45  BILITOT 0.6  PROT 7.1  ALBUMIN 4.2   Recent Labs  Lab 04/21/21 0506  LIPASE 28   No results for input(s): AMMONIA in the last 168 hours. Coagulation Profile: No results for input(s): INR, PROTIME in the last  168 hours. Cardiac Enzymes: No results for input(s): CKTOTAL, CKMB, CKMBINDEX, TROPONINI in the last 168 hours. BNP (last 3 results) No results for input(s): PROBNP in the last 8760 hours. HbA1C: No results for input(s): HGBA1C in the last 72 hours. CBG: Recent Labs  Lab 04/21/21 1533 04/21/21 2352 04/22/21 0758  GLUCAP 114* 156* 121*   Lipid Profile: No results for input(s): CHOL, HDL, LDLCALC, TRIG, CHOLHDL, LDLDIRECT in the last 72 hours. Thyroid Function Tests: No results for input(s): TSH, T4TOTAL, FREET4, T3FREE, THYROIDAB in the last 72 hours. Anemia Panel: No results for input(s): VITAMINB12, FOLATE, FERRITIN, TIBC, IRON, RETICCTPCT in the last 72 hours. Sepsis Labs: No results for input(s): PROCALCITON, LATICACIDVEN in the last 168 hours.  Recent Results (from the past 240 hour(s))  Resp Panel by RT-PCR (Flu A&B, Covid) Nasopharyngeal Swab     Status: None   Collection Time: 04/21/21 11:46 AM   Specimen: Nasopharyngeal Swab; Nasopharyngeal(NP) swabs in vial transport medium  Result Value Ref Range Status   SARS Coronavirus 2 by RT PCR NEGATIVE NEGATIVE Final    Comment: (NOTE) SARS-CoV-2 target nucleic acids are NOT DETECTED.  The SARS-CoV-2 RNA is generally detectable in upper respiratory specimens during the acute phase of infection. The lowest concentration of SARS-CoV-2 viral copies this assay can detect is 138 copies/mL. A negative result does not preclude SARS-Cov-2 infection and should not be used as the sole basis for treatment or other patient management decisions. A negative result may occur with  improper specimen collection/handling, submission of specimen other than nasopharyngeal swab, presence of viral mutation(s) within the areas targeted by this assay, and inadequate number of viral copies(<138 copies/mL). A negative result must be combined with clinical observations, patient history, and epidemiological information. The expected result is  Negative.  Fact Sheet for Patients:  EntrepreneurPulse.com.au  Fact Sheet for Healthcare Providers:  IncredibleEmployment.be  This test is no t yet approved or cleared by the [place] FDA and  has been authorized for detection and/or diagnosis of SARS-CoV-2 by FDA under an Emergency Use Authorization (EUA). This EUA will remain  in effect (meaning this test can be used) for the duration of the COVID-19 declaration under Section 564(b)(1) of the Act, 21 U.S.C.section 360bbb-3(b)(1), unless the authorization is terminated  or revoked sooner.       Influenza A by PCR NEGATIVE NEGATIVE Final   Influenza B by PCR NEGATIVE NEGATIVE Final    Comment: (NOTE) The Xpert Xpress SARS-CoV-2/FLU/RSV plus assay is intended as an aid in the diagnosis of influenza from Nasopharyngeal swab specimens and should not be used as a sole basis for treatment. Nasal washings and  aspirates are unacceptable for Xpert Xpress SARS-CoV-2/FLU/RSV testing.  Fact Sheet for Patients: EntrepreneurPulse.com.au  Fact Sheet for Healthcare Providers: IncredibleEmployment.be  This test is not yet approved or cleared by the [place] FDA and has been authorized for detection and/or diagnosis of SARS-CoV-2 by FDA under an Emergency Use Authorization (EUA). This EUA will remain in effect (meaning this test can be used) for the duration of the COVID-19 declaration under Section 564(b)(1) of the Act, 21 U.S.C. section 360bbb-3(b)(1), unless the authorization is terminated or revoked.  Performed at [facility], [place] [address] [place], [place] [address]   Surgical PCR screen     Status: None   Collection Time: 04/22/21 12:01 PM   Specimen: Nasal Mucosa; Nasal Swab  Result Value Ref Range Status   MRSA, PCR NEGATIVE NEGATIVE Final   Staphylococcus aureus NEGATIVE NEGATIVE Final    Comment: (NOTE) The Xpert SA Assay  (FDA approved for NASAL specimens in patients 59 years of age and older), is one component of a comprehensive surveillance program. It is not intended to diagnose infection nor to guide or monitor treatment. Performed at [facility], [place] [address], [place], [place] [address]      Radiology Studies: CT ABDOMEN PELVIS WO CONTRAST  Result Date: 04/21/2021 CLINICAL DATA:  Abdominal pain since last evening. EXAM: CT ABDOMEN AND PELVIS WITHOUT CONTRAST TECHNIQUE: Multidetector CT imaging of the abdomen and pelvis was performed following the standard protocol without IV contrast. COMPARISON:  CT scan 08/25/2020 FINDINGS: Lower chest: The lung bases are clear of an acute process. Stable small Bochdalek's hernia noted on the left. The heart is normal in size. No pericardial effusion. Hepatobiliary: No hepatic lesions are identified without contrast. The gallbladder is grossly normal. No common bile duct dilatation. Pancreas: No mass, inflammation or ductal dilatation. Spleen: Normal size. No focal lesions. Adrenals/Urinary Tract: Stable left adrenal gland adenoma. The right adrenal gland is normal. No renal calculi, hydronephrosis or renal lesions are identified. Stomach/Bowel: Stable postoperative changes involving the stomach from gastric sleeve procedure. No complicating features. The duodenum is unremarkable. Dilated mid distal small bowel loops with air-fluid levels consistent with obstruction. Findings due to strictured narrowing or adhesive process along the right-side of the abdominal wall hernia. There are non dilated small bowel loops and draining hernia on the right side. This is the same location of obstruction noted on the prior CT scan. Part of the transverse colon is also in the anterior abdominal wall hernia and is slightly distended and contains moderate stool. There are postoperative changes from a right hemicolectomy with ileocolonic anastomosis involving the colon in the  hernia. Vascular/Lymphatic: Minimal scattered vascular calcifications. No aneurysm. No mesenteric or retroperitoneal mass or adenopathy. Reproductive: The prostate gland and seminal vesicles are unremarkable. Other: No pelvic mass or adenopathy. No free pelvic fluid collections. No inguinal mass or adenopathy. Musculoskeletal: No significant bony findings. IMPRESSION: 1. CT findings consistent with a mid distal small bowel obstruction due to strictured narrowing or adhesive process along the right-side of the abdominal wall hernia. This is the same location of obstruction noted on the prior CT scan. 2. Status post right hemicolectomy with ileocolonic anastomosis involving the colon in the hernia. 3. Stable postoperative changes involving the stomach from gastric sleeve procedure. 4. Stable left adrenal gland adenoma. Electronically Signed   By: Marijo Sanes M.D.   On: 04/21/2021 10:42   DG Abd Portable 1V  Result Date: 04/22/2021 CLINICAL DATA:  Abdominal pain.  Nasogastric tube  placement. EXAM: PORTABLE ABDOMEN - 1 VIEW COMPARISON:  April 21, 2021. FINDINGS: Mildly dilated small bowel loops are noted in the upper abdomen concerning for ileus or possibly distal small bowel obstruction. Distal tip of nasogastric tube is seen in the proximal stomach. IMPRESSION: Distal tip of nasogastric tube seen in proximal stomach. Mildly dilated small bowel loops are noted in the upper abdomen concerning for possible ileus or small-bowel obstruction. Electronically Signed   By: Marijo Conception M.D.   On: 04/22/2021 10:41   DG Abd Portable 1V-Small Bowel Obstruction Protocol-initial, 8 hr delay  Result Date: 04/22/2021 CLINICAL DATA:  SBO protocol, 8 hour delay EXAM: PORTABLE ABDOMEN - 1 VIEW COMPARISON:  CT 04/13/2021, radiograph 04/21/2021 FINDINGS: Transesophageal tube tip and side port terminate distal to the GE junction. High attenuation enteric contrast media has diffuse through the small bowel. Question some  opacification at the cecum of the more distal transverse colonic segments not convincingly opacified. IMPRESSION: High attenuation enteric contrast media traverses through the small bowel with questionable if any true opacification of the right hemicolon. Recommend continued follow-up imaging. Appropriate positioning of the transesophageal tube. Electronically Signed   By: Lovena Le M.D.   On: 04/22/2021 02:01   DG Abd Portable 1V-Small Bowel Protocol-Position Verification  Result Date: 04/21/2021 CLINICAL DATA:  NG tube placement. EXAM: PORTABLE ABDOMEN - 1 VIEW COMPARISON:  Abdominal CT earlier today FINDINGS: Tip and side port of the enteric tube below the diaphragm in the stomach. No dilatation of upper abdominal bowel loops. Excreted IV contrast in the renal collecting systems from prior CT. IMPRESSION: Tip and side port of the enteric tube below the diaphragm in the stomach. Electronically Signed   By: Keith Rake M.D.   On: 04/21/2021 16:44    Scheduled Meds:  chlorhexidine  15 mL Mouth/Throat Once   Continuous Infusions:   ceFAZolin (ANCEF) IV     lactated ringers 0  (04/22/21 1425)     LOS: 1 day   Marylu Lund, MD [facility] Pager On Amion  If 7PM-7AM, please contact night-coverage 04/22/2021, 3:58 PM

## 2021-04-22 NOTE — Progress Notes (Signed)
Progress Note: General Surgery Service   Chief Complaint/Subjective: Continued nausea and abdominal pain overnight, frustrated that the NG tube is not functioning well and has required multiple adjustments by the nursing staff.  No flatus or bowel movements.  Objective: Vital signs in last 24 hours: Temp:  [98 F (36.7 C)-98.9 F (37.2 C)] 98.8 F (37.1 C) (07/29 0932) Pulse Rate:  [49-63] 49 (07/29 0932) Resp:  [17-18] 17 (07/29 0932) BP: (145-190)/(63-99) 154/78 (07/29 1030) SpO2:  [96 %-100 %] 99 % (07/29 0932) Weight:  [149.7 kg] 149.7 kg (07/28 1400) Last BM Date: 04/20/21  Intake/Output from previous day: 07/28 0701 - 07/29 0700 In: 1443.7 [I.V.:1443.7] Out: 425 [Emesis/NG output:425] Intake/Output this shift: No intake/output data recorded.  Constitutional: NAD; conversant; no deformities Eyes: Moist conjunctiva; no lid lag; anicteric; PERRL Neck: Trachea midline; no thyromegaly Lungs: Normal respiratory effort; no tactile fremitus CV: RRR; no palpable thrills; no pitting edema GI: Abd obese, difficult to palpate the edges of the ventral hernia, mild to moderate tenderness MSK: Normal range of motion of extremities; no clubbing/cyanosis Psychiatric: Appropriate affect; alert and oriented x3 Lymphatic: No palpable cervical or axillary lymphadenopathy  Lab Results: CBC  Recent Labs    04/21/21 0420 04/22/21 0515  WBC 7.7 11.4*  HGB 16.3 17.3*  HCT 50.7 53.0*  PLT 175 209   BMET Recent Labs    04/21/21 0506 04/22/21 0515  NA 141 140  K 4.3 3.9  CL 106 103  CO2 26 26  GLUCOSE 149* 148*  BUN 12 10  CREATININE 0.88 1.05  CALCIUM 9.6 9.4   PT/INR No results for input(s): LABPROT, INR in the last 72 hours. ABG No results for input(s): PHART, HCO3 in the last 72 hours.  Invalid input(s): PCO2, PO2  Anti-infectives: Anti-infectives (From admission, onward)    None       Medications: Scheduled Meds: Continuous Infusions:  dextrose 5 % and 0.9%  NaCl 100 mL/hr at 04/22/21 0231   PRN Meds:.acetaminophen **OR** acetaminophen, hydrALAZINE, morphine injection, ondansetron **OR** ondansetron (ZOFRAN) IV  Assessment/Plan: Mr. Dietzen is a 55 year old male who presented with a bowel obstruction and ventral hernia.  It appears on imaging that this obstruction is near the neck of the hernia, but intra-abdominal, possibly from adhesive disease from his multiple previous intra-abdominal surgeries.  It is difficult to examine him due to his body habitus.  He was evaluated by Dr. Rosendo Gros as an outpatient for hernia repair, and has been working on weight loss prior to hernia repair.  He has lost 150 pounds, and hoped to lose 50 more pounds prior to hernia repair.  He was admitted to the hospital and NG tube decompression was attempted, however contrast failed to advance through the small bowel on imaging and his symptoms did not resolve so recommendation was made to proceed with exploratory laparotomy.  Procedure itself as well as its risk, benefits, and alternatives were discussed with the patient.  The risk discussed included were not limited to the risk of infection, bleeding, damage nearby structures, recurrent hernia, wound infection.  We discussed the hernia.  I told him the main goal is to resolve this bowel obstruction.  If I am able to do that without contaminating the field, we would consider performing a formal retromuscular incisional hernia repair with mesh.  If there is contamination of the field, the hernia would be close primarily and he would be at higher risk of needing incisional hernia repair again in the future.   LOS: 1  day   FEN: N.p.o., IV fluids ID: Ancef preop VTE: SCDs, Lovenox Foley: None Dispo: Continue care on the medicine service postoperatively    Felicie Morn, MD  [facility] Use [email or web address] to contact on call provider

## 2021-04-22 NOTE — Plan of Care (Signed)
  Problem: Education: Goal: Knowledge of General Education information will improve Description: Including pain rating scale, medication(s)/side effects and non-pharmacologic comfort measures Outcome: Progressing   Problem: Clinical Measurements: Goal: Will remain free from infection Outcome: Progressing Goal: Diagnostic test results will improve Outcome: Progressing   Problem: Activity: Goal: Risk for activity intolerance will decrease Outcome: Progressing   Problem: Nutrition: Goal: Adequate nutrition will be maintained Outcome: Progressing

## 2021-04-22 NOTE — Anesthesia Postprocedure Evaluation (Signed)
Anesthesia Post Note  Patient: Cameron Jones  Procedure(s) Performed: INCISIONAL HERNIA REPAIR, BILATERAL POSTERIOR RECTUS SHEATH MYOFASCIAL RELEASE, LYSIS OF ADHESIONS, EXPLORATORY LAPAROTOMY (Abdomen)     Patient location during evaluation: PACU Anesthesia Type: General Level of consciousness: awake and alert Pain management: pain level controlled Vital Signs Assessment: post-procedure vital signs reviewed and stable Respiratory status: spontaneous breathing, nonlabored ventilation, respiratory function stable and patient connected to nasal cannula oxygen Cardiovascular status: blood pressure returned to baseline and stable Postop Assessment: no apparent nausea or vomiting Anesthetic complications: no   No notable events documented.  Last Vitals:  Vitals:   04/22/21 1925 04/22/21 1930  BP:  (!) 146/68  Pulse: 84 85  Resp: (!) 30 (!) 22  Temp:    SpO2: 97% 98%    Last Pain:  Vitals:   04/22/21 1925  TempSrc:   PainSc: 5                  Barnet Glasgow

## 2021-04-22 NOTE — Op Note (Signed)
Patient: Cameron Jones (November 04, 1965, [id number])  Date of Surgery: 04/21/2021 - 04/22/2021   Preoperative Diagnosis: SMALL BOWEL OBSTRUCTION AND INCISIONAL HERNIA  Postoperative Diagnosis: SMALL BOWEL OBSTRUCTION AND INCISIONAL HERNIA    Surgical Procedure:  INCISIONAL HERNIA REPAIR BILATERAL POSTERIOR RECTUS SHEATH MYOFASCIAL RELEASE LYSIS OF ADHESIONS EXPLORATORY LAPAROTOMY  Operative Team Members:  Surgeon(s) and Role:    Lord Lancour, Nickola Major, MD - Primary     Alferd Apa, PA-C - Assistant  Anesthesiologist: Barnet Glasgow, MD CRNA: Milford Cage, CRNA; Raenette Rover, CRNA; Sharlette Dense, CRNA   Anesthesia: General   Fluids: Crystalloid  Complications: None  Drains:  19 Fr. Blake drain in the retromuscular position exiting inferiorly in the left abdomen   19 Fr. Blake drain in subcutaneous space exiting superiorly in the left abdomen  Specimen: None  Disposition:  PACU - hemodynamically stable.  Plan of Care:  Continued inpatient care  Indications for Procedure: Cameron Jones is a 55 y.o. male who presented with abdominal pain, and was found to have a bowel obstruction on cross-sectional imaging.  Attempt was made to decompress the gastrointestinal tract with a nasogastric tube and follow x-ray after Gastrografin administration, however the patient's symptoms failed to improve after NG placement and the Gastrografin did not progress past the area of obstruction.  He was hoping to have an incisional hernia repair in the future with Dr. Rosendo Gros but was trying to lose 50 more pounds prior to surgery.  The obstruction did not appear to be in the hernia itself on imaging, but it did appear to be near the neck of the hernia.  As he was not improving with NG decompression I recommended we proceeded with exploratory laparotomy to treat the small bowel obstruction and incisional hernia repair with mesh reconstruction if we were able to at the time of  surgery.  The risks, benefits, and alternatives of this procedure were discussed with the patient.  The risk discussed included but not limited to the risk infection, bleeding, damage nearby structures, recurrent hernia, wound infection, mesh infection or complication requiring removal, or bowel injury.  After a full discussion all questions answered the patient granted consent to proceed.  We proceeded to the operating room urgently.   Findings:  Hernia Location: Ventral hernia location: Epigastric (M2) Hernia Size:  12 cm wide x 13 cm tall Mesh Size &Type:  22 cm wide x 30 cm tall Bard Soft Mesh Mesh Position: Sublay - Retromuscular Myofascial Releases: Bilateral Myofascial Release: Posterior rectus myofascial release   Description of Procedure:  The patient was positioned supine on the operating room table, adequately padded and secured.  A timeout procedure was performed.  A midline incision was made and dissection was carried down through subcutaneous tissue. The prior scar was excised, completely. The abdomen was entered safely.  There were dense adhesions.  A meticulous and tedious sharp lysis of adhesions was carried out.  This lysis of adhesion added 1 hour to the case.  It appeared the bowel obstruction was due to dense adhesions from the base of the hernia sacs to the omentum.  Once the adhesions were released the small bowel was run from the ligament of Treitz to the terminal ileum.  This portion of the case was completed with no injury to the viscera.  After the anterior abdominal wall was cleared off of all adhesions, a large safety towel was placed over the viscera to protect them.  The hernia defect was located in the  epigastric region. The total hernia defect area was measured and the size was recorded above under findings.  The hernia sac was harvested from the right side of the hernia in order to be used to complete the posterior layer closure.  We are able to keep the hernia  sac intact and connected to the peritoneum on the right superior aspect of the laparotomy.  A rectus myofascial release was performed on the LEFT side. The posterior rectus sheath was incised just lateral to the hernia edge.  This incision in the posterior rectus sheath was extended both cephalad and caudal to the hernia defect.  Dissection was carried out laterally in the retromuscular plane to the edge of the rectus sheath progressively disconnecting the rectus muscle from the underlying posterior rectus sheath. The segmental innervation comprised of intercostal nerve branches 8, 9, 10, 11 and 12 were individually identified and preserved.  The arterial blood supply to the rectus muscle comprised of the superior and inferior epigastric arteries along with the small terminal branches from the posterior intercostal arteries 10, 11 and 12, the subcostal artery, the posterior lumbar arteries, and the deep circumflex artery were all identified and individually preserved.  The rectus myofascial release accomplished medialization of the posterior rectus sheath towards the midline and disinsertion of the rectus muscle from its surrounding fascia, and thus its encasement in the rectus sheath, allowing for widening of the rectus muscle and transfer of the rectus flap towards the midline.  This will allow for future inset of the medial aspect of the flap for abdominal wall reconstruction.  A rectus myofascial release was performed on the RIGHT side. The posterior rectus sheath was incised just lateral to the hernia edge.  This incision in the posterior rectus sheath was extended both cephalad and caudal to the hernia defect.  Dissection was carried out laterally in the retromuscular plane to the edge of the rectus sheath progressively disconnecting the rectus muscle from the underlying posterior rectus sheath. The segmental innervation comprised of intercostal nerve branches 8, 9, 10, 11 and 12 were individually  identified and preserved.  The arterial blood supply to the rectus muscle comprised of the superior and inferior epigastric arteries along with the small terminal branches from the posterior intercostal arteries 10, 11 and 12, the subcostal artery, the posterior lumbar arteries, and the deep circumflex artery were all identified and individually preserved.  The rectus myofascial release accomplished medialization of the posterior rectus sheath towards the midline and disinsertion of the rectus muscle from its surrounding fascia, and thus its encasement in the rectus sheath, allowing for widening of the rectus muscle and transfer of the rectus flap towards the midline.  This will allow for future inset of the medial aspect of the flap for abdominal wall reconstruction.  The towel was removed and the posterior layer of the abdominal wall reconstruction was created.  The visceral sac was reapproximated by bringing together the hernia sac with the edge of the peritoneum and posterior rectus sheath to completely and close the peritoneal cavity.  This was closed using running 2-0 Vicryl suture.   A new set of sterile gloves were used prior to handling the mesh. The mesh listed above was brought into the operative field and cut to the size specified above.  The mesh was deployed into the retromuscular position where it conformed well to the space.  It did not require any fixation.  The mesh space was irrigated with saline.  One 103 [place] Blake drain was  placed in the retromuscular position. The rectus muscles were re approximated in the midline utilizing a continuous 2-0 PDS suture taking 5 mm bites of the fascia with 5 mm advancement. The fascia came together well. The subcutaneous tissue was irrigated with saline.  A 19 French drain was brought into the large subcutaneous space created by the hernia sac.  This was brought out through the abdominal wall just superior to the retromuscular drain.  Scarpa's fascia  was reapproximated with interrupted 2-0 Vicryl suture.  The subcuticular tissue was reapproximated with buried, interrupted 2-0 Vicryl suture.  The skin was closed with a 4-0 Monocryl subcuticular suture and skin glue.  All sponge and needle counts were correct at the end of this case.   Louanna Raw, MD General, Bariatric, & Minimally Invasive Surgery [facility], [place]

## 2021-04-22 NOTE — Transfer of Care (Signed)
Immediate Anesthesia Transfer of Care Note  Patient: Tish Frederickson  Procedure(s) Performed: INCISIONAL HERNIA REPAIR, BILATERAL POSTERIOR RECTUS SHEATH MYOFASCIAL RELEASE, LYSIS OF ADHESIONS, EXPLORATORY LAPAROTOMY (Abdomen)  Patient Location: PACU  Anesthesia Type:General  Level of Consciousness: awake  Airway & Oxygen Therapy: Patient Spontanous Breathing and Patient connected to face mask oxygen  Post-op Assessment: Report given to RN and Post -op Vital signs reviewed and stable  Post vital signs: Reviewed and stable  Last Vitals:  Vitals Value Taken Time  BP 174/95 04/22/21 1847  Temp 36.7 C 04/22/21 1844  Pulse 91 04/22/21 1850  Resp 26 04/22/21 1850  SpO2 100 % 04/22/21 1850  Vitals shown include unvalidated device data.  Last Pain:  Vitals:   04/22/21 1322  TempSrc:   PainSc: 6       Patients Stated Pain Goal: 3 (0000000 [id number])  Complications: No notable events documented.

## 2021-04-23 DIAGNOSIS — K56609 Unspecified intestinal obstruction, unspecified as to partial versus complete obstruction: Secondary | ICD-10-CM | POA: Diagnosis not present

## 2021-04-23 DIAGNOSIS — I10 Essential (primary) hypertension: Secondary | ICD-10-CM | POA: Diagnosis not present

## 2021-04-23 LAB — COMPREHENSIVE METABOLIC PANEL
ALT: 21 U/L (ref 0–44)
AST: 18 U/L (ref 15–41)
Albumin: 3.9 g/dL (ref 3.5–5.0)
Alkaline Phosphatase: 38 U/L (ref 38–126)
Anion gap: 10 (ref 5–15)
BUN: 12 mg/dL (ref 6–20)
CO2: 27 mmol/L (ref 22–32)
Calcium: 8.8 mg/dL — ABNORMAL LOW (ref 8.9–10.3)
Chloride: 104 mmol/L (ref 98–111)
Creatinine, Ser: 1.06 mg/dL (ref 0.61–1.24)
GFR, Estimated: >60 mL/min (ref >60)
Glucose, Bld: 114 mg/dL — ABNORMAL HIGH (ref 70–99)
Potassium: 4 mmol/L (ref 3.5–5.1)
Sodium: 141 mmol/L (ref 135–145)
Total Bilirubin: 1.6 mg/dL — ABNORMAL HIGH (ref 0.3–1.2)
Total Protein: 6.4 g/dL — ABNORMAL LOW (ref 6.5–8.1)

## 2021-04-23 LAB — CBC
HCT: 49.4 % (ref 39.0–52.0)
Hemoglobin: 15.5 g/dL (ref 13.0–17.0)
MCH: 29.2 pg (ref 26.0–34.0)
MCHC: 31.4 g/dL (ref 30.0–36.0)
MCV: 93 fL (ref 80.0–100.0)
Platelets: 169 10*3/uL (ref 150–400)
RBC: 5.31 MIL/uL (ref 4.22–5.81)
RDW: 13.8 % (ref 11.5–15.5)
WBC: 11.3 10*3/uL — ABNORMAL HIGH (ref 4.0–10.5)
nRBC: 0 % (ref 0.0–0.2)

## 2021-04-23 LAB — GLUCOSE, CAPILLARY
Glucose-Capillary: 91 mg/dL (ref 70–99)
Glucose-Capillary: 124 mg/dL — ABNORMAL HIGH (ref 70–99)
Glucose-Capillary: 91 mg/dL (ref 70–99)

## 2021-04-23 MED ORDER — AMLODIPINE BESYLATE 10 MG PO TABS
10.0000 mg | ORAL_TABLET | Freq: Every day | ORAL | Status: DC
  Administered 2021-04-23 – 2021-04-26 (×4): 10 mg
  Filled 2021-04-23 (×3): qty 1

## 2021-04-23 NOTE — Progress Notes (Signed)
1 Day Post-Op open incisional hernia repair with bilateral rectus sheath release and primary closure  Subjective: Pain controlled, no major complaints, ambulated in the hall  Objective: Vital signs in last 24 hours: Temp:  [98.1 F (36.7 C)-99.2 F (37.3 C)] 98.5 F (36.9 C) (07/30 0620) Pulse Rate:  [59-96] 79 (07/30 0620) Resp:  [10-30] 18 (07/30 0620) BP: (140-174)/(68-95) 155/80 (07/30 0620) SpO2:  [97 %-100 %] 100 % (07/30 0620)   Intake/Output from previous day: 07/29 0701 - 07/30 0700 In: 3608.6 [I.V.:2508.6; IV Piggyback:1100] Out: 1499 [Urine:1125; Drains:174; Blood:200] Intake/Output this shift: Total I/O In: 30 [NG/GT:30] Out: 0    General appearance: alert and cooperative GI: soft, binder in place  Incision: no significant drainage  Lab Results:  Recent Labs    04/22/21 0515 04/23/21 0529  WBC 11.4* 11.3*  HGB 17.3* 15.5  HCT 53.0* 49.4  PLT 209 169   BMET Recent Labs    04/22/21 0515 04/23/21 0529  NA 140 141  K 3.9 4.0  CL 103 104  CO2 26 27  GLUCOSE 148* 114*  BUN 10 12  CREATININE 1.05 1.06  CALCIUM 9.4 8.8*   PT/INR No results for input(s): LABPROT, INR in the last 72 hours. ABG No results for input(s): PHART, HCO3 in the last 72 hours.  Invalid input(s): PCO2, PO2  MEDS, Scheduled  gabapentin  300 mg Per Tube TID   metoCLOPramide (REGLAN) injection  10 mg Intravenous Q6H    Studies/Results: DG Abd Portable 1V  Result Date: 04/22/2021 CLINICAL DATA:  Abdominal pain.  Nasogastric tube placement. EXAM: PORTABLE ABDOMEN - 1 VIEW COMPARISON:  April 21, 2021. FINDINGS: Mildly dilated small bowel loops are noted in the upper abdomen concerning for ileus or possibly distal small bowel obstruction. Distal tip of nasogastric tube is seen in the proximal stomach. IMPRESSION: Distal tip of nasogastric tube seen in proximal stomach. Mildly dilated small bowel loops are noted in the upper abdomen concerning for possible ileus or small-bowel  obstruction. Electronically Signed   By: Marijo Conception M.D.   On: 04/22/2021 10:41   DG Abd Portable 1V-Small Bowel Obstruction Protocol-initial, 8 hr delay  Result Date: 04/22/2021 CLINICAL DATA:  SBO protocol, 8 hour delay EXAM: PORTABLE ABDOMEN - 1 VIEW COMPARISON:  CT 04/13/2021, radiograph 04/21/2021 FINDINGS: Transesophageal tube tip and side port terminate distal to the GE junction. High attenuation enteric contrast media has diffuse through the small bowel. Question some opacification at the cecum of the more distal transverse colonic segments not convincingly opacified. IMPRESSION: High attenuation enteric contrast media traverses through the small bowel with questionable if any true opacification of the right hemicolon. Recommend continued follow-up imaging. Appropriate positioning of the transesophageal tube. Electronically Signed   By: Lovena Le M.D.   On: 04/22/2021 02:01   DG Abd Portable 1V-Small Bowel Protocol-Position Verification  Result Date: 04/21/2021 CLINICAL DATA:  NG tube placement. EXAM: PORTABLE ABDOMEN - 1 VIEW COMPARISON:  Abdominal CT earlier today FINDINGS: Tip and side port of the enteric tube below the diaphragm in the stomach. No dilatation of upper abdominal bowel loops. Excreted IV contrast in the renal collecting systems from prior CT. IMPRESSION: Tip and side port of the enteric tube below the diaphragm in the stomach. Electronically Signed   By: Keith Rake M.D.   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

## 2021-04-23 NOTE — Progress Notes (Signed)
PROGRESS NOTE    Cameron Jones  [id number] DOB: June 03, 1966 DOA: 04/21/2021 PCP: Haywood Pao, MD    Brief Narrative:  55 y.o. male with history of colon cancer s/p resection, history of gastric sleeve surgery, hypertension, sleep apnea presents to the ER with complaint of abdominal pain with nausea but denies any vomiting.  Last bowel movement was more than 24 hours ago.  Abdominal pain is diffuse.  CT imaging had findings worrisome for SBO with transition point. General Surgery was consulted  Assessment & Plan:   Principal Problem:   SBO (small bowel obstruction) (HCC) Active Problems:   Obstructive sleep apnea of adult   Essential hypertension   Small bowel obstruction noted on CT scan  Transition zone noted on CT, General Surgery following Underwent surgery 7/29. Appreciate input by General Surgery. Plan d/c foley today and encourage ambulation Hypertension  Continue PRN IV hydralazine as needed BP stable. Will resume home norvasc History of sleep apnea on CPAP. History of colon cancer s/p resection and history of gastric sleeve surgery. Cont as per General Surgery   DVT prophylaxis: SCD's Code Status: Full Family Communication: Pt in room, family at bedside  Status is: Inpatient  Remains inpatient appropriate because:Inpatient level of care appropriate due to severity of illness  Dispo: The patient is from: Home              Anticipated d/c is to: Home              Patient currently is not medically stable to d/c.   Difficult to place patient No   Consultants:  General Surgery  Procedures:    Antimicrobials: Anti-infectives (From admission, onward)    Start     Dose/Rate Route Frequency Ordered Stop   04/22/21 1415  ceFAZolin (ANCEF) IVPB 3g/100 mL premix  Status:  Discontinued        3 g 200 mL/hr over 30 Minutes Intravenous  Once 04/22/21 1404 04/22/21 2016   04/22/21 1400  ceFAZolin (ANCEF) 3 g in dextrose 5 % 50 mL IVPB  Status:   Discontinued        3 g 100 mL/hr over 30 Minutes Intravenous  Once 04/22/21 1356 04/22/21 1404       Subjective: Feels uncomfortable in bed. Eager to ambulate  Objective: Vitals:   04/22/21 2221 04/22/21 2322 04/23/21 0620 04/23/21 1402  BP: (!) 141/74 140/74 (!) 155/80 139/82  Pulse: 70 81 79 (!) 106  Resp: '20 18 18   '$ Temp: 98.2 F (36.8 C) 98.4 F (36.9 C) 98.5 F (36.9 C) 98.4 F (36.9 C)  TempSrc: Oral Oral Oral Oral  SpO2: 99% 99% 100% 96%  Weight:      Height:        Intake/Output Summary (Last 24 hours) at 04/23/2021 1700 Last data filed at 04/23/2021 1610 Gross per 24 hour  Intake 2090 ml  Output 2269 ml  Net -179 ml    Filed Weights   04/21/21 1400  Weight: (!) 149.7 kg    Examination: General exam: Conversant, in no acute distress Respiratory system: normal chest rise, clear, no audible wheezing Cardiovascular system: regular rhythm, s1-s2 Gastrointestinal system: Nondistended, nontender, pos BS Central nervous system: No seizures, no tremors Extremities: No cyanosis, no joint deformities Skin: No rashes, no pallor Psychiatry: Affect normal // no auditory hallucinations   Data Reviewed: I have personally reviewed following labs and imaging studies  CBC: Recent Labs  Lab 04/21/21 0420 04/22/21 0515 04/23/21 0529  WBC  7.7 11.4* 11.3*  NEUTROABS 6.9  --   --   HGB 16.3 17.3* 15.5  HCT 50.7 53.0* 49.4  MCV 90.4 89.4 93.0  PLT 175 209 [id number]    Basic Metabolic Panel: Recent Labs  Lab 04/21/21 0506 04/22/21 0515 04/23/21 0529  NA 141 140 141  K 4.3 3.9 4.0  CL 106 103 104  CO2 '26 26 27  '$ GLUCOSE 149* 148* 114*  BUN '12 10 12  '$ CREATININE 0.88 1.05 1.06  CALCIUM 9.6 9.4 8.8*    GFR: Estimated Creatinine Clearance: 119.8 mL/min (by C-G formula based on SCr of 1.06 mg/dL). Liver Function Tests: Recent Labs  Lab 04/21/21 0506 04/23/21 0529  AST 28 18  ALT 40 21  ALKPHOS 45 38  BILITOT 0.6 1.6*  PROT 7.1 6.4*  ALBUMIN 4.2 3.9     Recent Labs  Lab 04/21/21 0506  LIPASE 28    No results for input(s): AMMONIA in the last 168 hours. Coagulation Profile: No results for input(s): INR, PROTIME in the last 168 hours. Cardiac Enzymes: No results for input(s): CKTOTAL, CKMB, CKMBINDEX, TROPONINI in the last 168 hours. BNP (last 3 results) No results for input(s): PROBNP in the last 8760 hours. HbA1C: No results for input(s): HGBA1C in the last 72 hours. CBG: Recent Labs  Lab 04/22/21 0758 04/22/21 2027 04/23/21 0333 04/23/21 0728 04/23/21 1609  GLUCAP 121* 101* 91 124* 91    Lipid Profile: No results for input(s): CHOL, HDL, LDLCALC, TRIG, CHOLHDL, LDLDIRECT in the last 72 hours. Thyroid Function Tests: No results for input(s): TSH, T4TOTAL, FREET4, T3FREE, THYROIDAB in the last 72 hours. Anemia Panel: No results for input(s): VITAMINB12, FOLATE, FERRITIN, TIBC, IRON, RETICCTPCT in the last 72 hours. Sepsis Labs: No results for input(s): PROCALCITON, LATICACIDVEN in the last 168 hours.  Recent Results (from the past 240 hour(s))  Resp Panel by RT-PCR (Flu A&B, Covid) Nasopharyngeal Swab     Status: None   Collection Time: 04/21/21 11:46 AM   Specimen: Nasopharyngeal Swab; Nasopharyngeal(NP) swabs in vial transport medium  Result Value Ref Range Status   SARS Coronavirus 2 by RT PCR NEGATIVE NEGATIVE Final    Comment: (NOTE) SARS-CoV-2 target nucleic acids are NOT DETECTED.  The SARS-CoV-2 RNA is generally detectable in upper respiratory specimens during the acute phase of infection. The lowest concentration of SARS-CoV-2 viral copies this assay can detect is 138 copies/mL. A negative result does not preclude SARS-Cov-2 infection and should not be used as the sole basis for treatment or other patient management decisions. A negative result may occur with  improper specimen collection/handling, submission of specimen other than nasopharyngeal swab, presence of viral mutation(s) within the areas  targeted by this assay, and inadequate number of viral copies(<138 copies/mL). A negative result must be combined with clinical observations, patient history, and epidemiological information. The expected result is Negative.  Fact Sheet for Patients:  EntrepreneurPulse.com.au  Fact Sheet for Healthcare Providers:  IncredibleEmployment.be  This test is no t yet approved or cleared by the [place] FDA and  has been authorized for detection and/or diagnosis of SARS-CoV-2 by FDA under an Emergency Use Authorization (EUA). This EUA will remain  in effect (meaning this test can be used) for the duration of the COVID-19 declaration under Section 564(b)(1) of the Act, 21 U.S.C.section 360bbb-3(b)(1), unless the authorization is terminated  or revoked sooner.       Influenza A by PCR NEGATIVE NEGATIVE Final   Influenza B by PCR NEGATIVE NEGATIVE Final  Comment: (NOTE) The Xpert Xpress SARS-CoV-2/FLU/RSV plus assay is intended as an aid in the diagnosis of influenza from Nasopharyngeal swab specimens and should not be used as a sole basis for treatment. Nasal washings and aspirates are unacceptable for Xpert Xpress SARS-CoV-2/FLU/RSV testing.  Fact Sheet for Patients: EntrepreneurPulse.com.au  Fact Sheet for Healthcare Providers: IncredibleEmployment.be  This test is not yet approved or cleared by the [place] FDA and has been authorized for detection and/or diagnosis of SARS-CoV-2 by FDA under an Emergency Use Authorization (EUA). This EUA will remain in effect (meaning this test can be used) for the duration of the COVID-19 declaration under Section 564(b)(1) of the Act, 21 U.S.C. section 360bbb-3(b)(1), unless the authorization is terminated or revoked.  Performed at [facility], [place] [address] [place], [place] [address]   Surgical PCR screen     Status: None   Collection  Time: 04/22/21 12:01 PM   Specimen: Nasal Mucosa; Nasal Swab  Result Value Ref Range Status   MRSA, PCR NEGATIVE NEGATIVE Final   Staphylococcus aureus NEGATIVE NEGATIVE Final    Comment: (NOTE) The Xpert SA Assay (FDA approved for NASAL specimens in patients 45 years of age and older), is one component of a comprehensive surveillance program. It is not intended to diagnose infection nor to guide or monitor treatment. Performed at [facility], [place] [address] [place], [place] [address]       Radiology Studies: DG Abd Portable 1V  Result Date: 04/22/2021 CLINICAL DATA:  Abdominal pain.  Nasogastric tube placement. EXAM: PORTABLE ABDOMEN - 1 VIEW COMPARISON:  April 21, 2021. FINDINGS: Mildly dilated small bowel loops are noted in the upper abdomen concerning for ileus or possibly distal small bowel obstruction. Distal tip of nasogastric tube is seen in the proximal stomach. IMPRESSION: Distal tip of nasogastric tube seen in proximal stomach. Mildly dilated small bowel loops are noted in the upper abdomen concerning for possible ileus or small-bowel obstruction. Electronically Signed   By: Marijo Conception M.D.   On: 04/22/2021 10:41   DG Abd Portable 1V-Small Bowel Obstruction Protocol-initial, 8 hr delay  Result Date: 04/22/2021 CLINICAL DATA:  SBO protocol, 8 hour delay EXAM: PORTABLE ABDOMEN - 1 VIEW COMPARISON:  CT 04/13/2021, radiograph 04/21/2021 FINDINGS: Transesophageal tube tip and side port terminate distal to the GE junction. High attenuation enteric contrast media has diffuse through the small bowel. Question some opacification at the cecum of the more distal transverse colonic segments not convincingly opacified. IMPRESSION: High attenuation enteric contrast media traverses through the small bowel with questionable if any true opacification of the right hemicolon. Recommend continued follow-up imaging. Appropriate positioning of the transesophageal tube.  Electronically Signed   By: Lovena Le M.D.   On: 04/22/2021 02:01    Scheduled Meds:  gabapentin  300 mg Per Tube TID   metoCLOPramide (REGLAN) injection  10 mg Intravenous Q6H   Continuous Infusions:  lactated ringers     methocarbamol (ROBAXIN) IV       LOS: 2 days   Marylu Lund, MD [facility] Pager On Amion  If 7PM-7AM, please contact night-coverage 04/23/2021, 5:00 PM

## 2021-04-24 DIAGNOSIS — K56609 Unspecified intestinal obstruction, unspecified as to partial versus complete obstruction: Secondary | ICD-10-CM | POA: Diagnosis not present

## 2021-04-24 DIAGNOSIS — I10 Essential (primary) hypertension: Secondary | ICD-10-CM | POA: Diagnosis not present

## 2021-04-24 LAB — BASIC METABOLIC PANEL
Anion gap: 6 (ref 5–15)
BUN: 12 mg/dL (ref 6–20)
CO2: 28 mmol/L (ref 22–32)
Calcium: 9 mg/dL (ref 8.9–10.3)
Chloride: 104 mmol/L (ref 98–111)
Creatinine, Ser: 0.88 mg/dL (ref 0.61–1.24)
GFR, Estimated: >60 mL/min (ref >60)
Glucose, Bld: 110 mg/dL — ABNORMAL HIGH (ref 70–99)
Potassium: 3.4 mmol/L — ABNORMAL LOW (ref 3.5–5.1)
Sodium: 138 mmol/L (ref 135–145)

## 2021-04-24 MED ORDER — POTASSIUM CHLORIDE 10 MEQ/100ML IV SOLN
10.0000 meq | INTRAVENOUS | Status: DC
Start: 2021-04-24 — End: 2021-04-24

## 2021-04-24 MED ORDER — GABAPENTIN 300 MG PO CAPS
300.0000 mg | ORAL_CAPSULE | Freq: Three times a day (TID) | ORAL | Status: DC
  Administered 2021-04-24 – 2021-04-27 (×10): 300 mg via ORAL
  Filled 2021-04-24 (×10): qty 1

## 2021-04-24 MED ORDER — POTASSIUM CHLORIDE CRYS ER 20 MEQ PO TBCR
60.0000 meq | EXTENDED_RELEASE_TABLET | Freq: Once | ORAL | Status: AC
  Administered 2021-04-24: 60 meq via ORAL
  Filled 2021-04-24: qty 3

## 2021-04-24 NOTE — Progress Notes (Signed)
PROGRESS NOTE    Jarron Slice  [id number] DOB: 29-Oct-1965 DOA: 04/21/2021 PCP: Haywood Pao, MD    Brief Narrative:  55 y.o. male with history of colon cancer s/p resection, history of gastric sleeve surgery, hypertension, sleep apnea presents to the ER with complaint of abdominal pain with nausea but denies any vomiting.  Last bowel movement was more than 24 hours ago.  Abdominal pain is diffuse.  CT imaging had findings worrisome for SBO with transition point. General Surgery was consulted  Assessment & Plan:   Principal Problem:   SBO (small bowel obstruction) (HCC) Active Problems:   Obstructive sleep apnea of adult   Essential hypertension   Small bowel obstruction noted on CT scan  Transition zone noted on CT, General Surgery following Underwent surgery 7/29. Appreciate input by General Surgery. Diet advanced per General Surgery Cont to encourage ambulation Hypertension  Continue PRN IV hydralazine as needed BP stable. Will resume home norvasc History of sleep apnea on CPAP. History of colon cancer s/p resection and history of gastric sleeve surgery. Cont as per General Surgery   DVT prophylaxis: SCD's Code Status: Full Family Communication: Pt in room, family at bedside  Status is: Inpatient  Remains inpatient appropriate because:Inpatient level of care appropriate due to severity of illness  Dispo: The patient is from: Home              Anticipated d/c is to: Home              Patient currently is not medically stable to d/c.   Difficult to place patient No   Consultants:  General Surgery  Procedures:    Antimicrobials: Anti-infectives (From admission, onward)    Start     Dose/Rate Route Frequency Ordered Stop   04/22/21 1415  ceFAZolin (ANCEF) IVPB 3g/100 mL premix  Status:  Discontinued        3 g 200 mL/hr over 30 Minutes Intravenous  Once 04/22/21 1404 04/22/21 2016   04/22/21 1400  ceFAZolin (ANCEF) 3 g in dextrose 5 % 50  mL IVPB  Status:  Discontinued        3 g 100 mL/hr over 30 Minutes Intravenous  Once 04/22/21 1356 04/22/21 1404       Subjective: Reports feeling better today  Objective: Vitals:   04/23/21 2136 04/24/21 0650 04/24/21 1338 04/24/21 1437  BP: (!) 152/89 (!) 150/94 (!) 155/83 (!) 155/84  Pulse: (!) 106 (!) 109 (!) 123 (!) 105  Resp: '18 18 12 14  '$ Temp: 99.3 F (37.4 C) 99.6 F (37.6 C) 99.9 F (37.7 C) 99.7 F (37.6 C)  TempSrc: Oral Oral Oral Oral  SpO2: 97% 96% 98% 96%  Weight:      Height:        Intake/Output Summary (Last 24 hours) at 04/24/2021 1600 Last data filed at 04/24/2021 1010 Gross per 24 hour  Intake 1004.42 ml  Output 1860 ml  Net -855.58 ml    Filed Weights   04/21/21 1400  Weight: (!) 149.7 kg    Examination: General exam: Awake, laying in bed, in nad Respiratory system: Normal respiratory effort, no wheezing Cardiovascular system: regular rate, s1, s2 Gastrointestinal system: Soft, nondistended, positive BS Central nervous system: CN2-12 grossly intact, strength intact Extremities: Perfused, no clubbing Skin: Normal skin turgor, no notable skin lesions seen Psychiatry: Mood normal // no visual hallucinations   Data Reviewed: I have personally reviewed following labs and imaging studies  CBC: Recent Labs  Lab 04/21/21  [id number] 04/22/21 0515 04/23/21 0529  WBC 7.7 11.4* 11.3*  NEUTROABS 6.9  --   --   HGB 16.3 17.3* 15.5  HCT 50.7 53.0* 49.4  MCV 90.4 89.4 93.0  PLT 175 209 [id number]    Basic Metabolic Panel: Recent Labs  Lab 04/21/21 0506 04/22/21 0515 04/23/21 0529 04/24/21 0521  NA 141 140 141 138  K 4.3 3.9 4.0 3.4*  CL 106 103 104 104  CO2 '26 26 27 28  '$ GLUCOSE 149* 148* 114* 110*  BUN '12 10 12 12  '$ CREATININE 0.88 1.05 1.06 0.88  CALCIUM 9.6 9.4 8.8* 9.0    GFR: Estimated Creatinine Clearance: 144.3 mL/min (by C-G formula based on SCr of 0.88 mg/dL). Liver Function Tests: Recent Labs  Lab 04/21/21 0506 04/23/21 0529   AST 28 18  ALT 40 21  ALKPHOS 45 38  BILITOT 0.6 1.6*  PROT 7.1 6.4*  ALBUMIN 4.2 3.9    Recent Labs  Lab 04/21/21 0506  LIPASE 28    No results for input(s): AMMONIA in the last 168 hours. Coagulation Profile: No results for input(s): INR, PROTIME in the last 168 hours. Cardiac Enzymes: No results for input(s): CKTOTAL, CKMB, CKMBINDEX, TROPONINI in the last 168 hours. BNP (last 3 results) No results for input(s): PROBNP in the last 8760 hours. HbA1C: No results for input(s): HGBA1C in the last 72 hours. CBG: Recent Labs  Lab 04/22/21 0758 04/22/21 2027 04/23/21 0333 04/23/21 0728 04/23/21 1609  GLUCAP 121* 101* 91 124* 91    Lipid Profile: No results for input(s): CHOL, HDL, LDLCALC, TRIG, CHOLHDL, LDLDIRECT in the last 72 hours. Thyroid Function Tests: No results for input(s): TSH, T4TOTAL, FREET4, T3FREE, THYROIDAB in the last 72 hours. Anemia Panel: No results for input(s): VITAMINB12, FOLATE, FERRITIN, TIBC, IRON, RETICCTPCT in the last 72 hours. Sepsis Labs: No results for input(s): PROCALCITON, LATICACIDVEN in the last 168 hours.  Recent Results (from the past 240 hour(s))  Resp Panel by RT-PCR (Flu A&B, Covid) Nasopharyngeal Swab     Status: None   Collection Time: 04/21/21 11:46 AM   Specimen: Nasopharyngeal Swab; Nasopharyngeal(NP) swabs in vial transport medium  Result Value Ref Range Status   SARS Coronavirus 2 by RT PCR NEGATIVE NEGATIVE Final    Comment: (NOTE) SARS-CoV-2 target nucleic acids are NOT DETECTED.  The SARS-CoV-2 RNA is generally detectable in upper respiratory specimens during the acute phase of infection. The lowest concentration of SARS-CoV-2 viral copies this assay can detect is 138 copies/mL. A negative result does not preclude SARS-Cov-2 infection and should not be used as the sole basis for treatment or other patient management decisions. A negative result may occur with  improper specimen collection/handling, submission  of specimen other than nasopharyngeal swab, presence of viral mutation(s) within the areas targeted by this assay, and inadequate number of viral copies(<138 copies/mL). A negative result must be combined with clinical observations, patient history, and epidemiological information. The expected result is Negative.  Fact Sheet for Patients:  EntrepreneurPulse.com.au  Fact Sheet for Healthcare Providers:  IncredibleEmployment.be  This test is no t yet approved or cleared by the [place] FDA and  has been authorized for detection and/or diagnosis of SARS-CoV-2 by FDA under an Emergency Use Authorization (EUA). This EUA will remain  in effect (meaning this test can be used) for the duration of the COVID-19 declaration under Section 564(b)(1) of the Act, 21 U.S.C.section 360bbb-3(b)(1), unless the authorization is terminated  or revoked sooner.  Influenza A by PCR NEGATIVE NEGATIVE Final   Influenza B by PCR NEGATIVE NEGATIVE Final    Comment: (NOTE) The Xpert Xpress SARS-CoV-2/FLU/RSV plus assay is intended as an aid in the diagnosis of influenza from Nasopharyngeal swab specimens and should not be used as a sole basis for treatment. Nasal washings and aspirates are unacceptable for Xpert Xpress SARS-CoV-2/FLU/RSV testing.  Fact Sheet for Patients: EntrepreneurPulse.com.au  Fact Sheet for Healthcare Providers: IncredibleEmployment.be  This test is not yet approved or cleared by the [place] FDA and has been authorized for detection and/or diagnosis of SARS-CoV-2 by FDA under an Emergency Use Authorization (EUA). This EUA will remain in effect (meaning this test can be used) for the duration of the COVID-19 declaration under Section 564(b)(1) of the Act, 21 U.S.C. section 360bbb-3(b)(1), unless the authorization is terminated or revoked.  Performed at [facility], [place]  [address] [place], [place] [address]   Surgical PCR screen     Status: None   Collection Time: 04/22/21 12:01 PM   Specimen: Nasal Mucosa; Nasal Swab  Result Value Ref Range Status   MRSA, PCR NEGATIVE NEGATIVE Final   Staphylococcus aureus NEGATIVE NEGATIVE Final    Comment: (NOTE) The Xpert SA Assay (FDA approved for NASAL specimens in patients 23 years of age and older), is one component of a comprehensive surveillance program. It is not intended to diagnose infection nor to guide or monitor treatment. Performed at [facility], [place] [address] [place], [place] [address]       Radiology Studies: No results found.  Scheduled Meds:  amLODipine  10 mg Per Tube Daily   gabapentin  300 mg Oral TID   metoCLOPramide (REGLAN) injection  10 mg Intravenous Q6H   Continuous Infusions:  lactated ringers 100 mL/hr at 04/24/21 0934   methocarbamol (ROBAXIN) IV       LOS: 3 days   Marylu Lund, MD [facility] Pager On Amion  If 7PM-7AM, please contact night-coverage 04/24/2021, 4:00 PM

## 2021-04-24 NOTE — Progress Notes (Signed)
Patient has been unable to wear his home CPAP due to having an NG tube in

## 2021-04-24 NOTE — Progress Notes (Signed)
2 Days Post-Op open incisional hernia repair with bilateral rectus sheath release and primary closure  Subjective: Pain controlled, no major complaints, ambulated in the hall  Objective: Vital signs in last 24 hours: Temp:  [98.4 F (36.9 C)-100.1 F (37.8 C)] 99.6 F (37.6 C) (07/31 0650) Pulse Rate:  [106-109] 109 (07/31 0650) Resp:  [18] 18 (07/31 0650) BP: (139-152)/(82-94) 150/94 (07/31 0650) SpO2:  [96 %-97 %] 96 % (07/31 0650)   Intake/Output from previous day: 07/30 0701 - 07/31 0700 In: 1884.4 [P.O.:90; I.V.:1644.4; NG/GT:150] Out: 3000 [Urine:2150; Emesis/NG output:100; Drains:750] Intake/Output this shift: No intake/output data recorded.   General appearance: alert and cooperative GI: soft, binder in place  Incision: no significant drainage, no signs of infection  Lab Results:  Recent Labs    04/22/21 0515 04/23/21 0529  WBC 11.4* 11.3*  HGB 17.3* 15.5  HCT 53.0* 49.4  PLT 209 169    BMET Recent Labs    04/23/21 0529 04/24/21 0521  NA 141 138  K 4.0 3.4*  CL 104 104  CO2 27 28  GLUCOSE 114* 110*  BUN 12 12  CREATININE 1.06 0.88  CALCIUM 8.8* 9.0    PT/INR No results for input(s): LABPROT, INR in the last 72 hours. ABG No results for input(s): PHART, HCO3 in the last 72 hours.  Invalid input(s): PCO2, PO2  MEDS, Scheduled  amLODipine  10 mg Per Tube Daily   gabapentin  300 mg Per Tube TID   metoCLOPramide (REGLAN) injection  10 mg Intravenous Q6H    Studies/Results: No results found.  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

## 2021-04-25 ENCOUNTER — Encounter (HOSPITAL_COMMUNITY): Payer: Self-pay | Admitting: Surgery

## 2021-04-25 DIAGNOSIS — I10 Essential (primary) hypertension: Secondary | ICD-10-CM | POA: Diagnosis not present

## 2021-04-25 DIAGNOSIS — K56609 Unspecified intestinal obstruction, unspecified as to partial versus complete obstruction: Secondary | ICD-10-CM | POA: Diagnosis not present

## 2021-04-25 LAB — BASIC METABOLIC PANEL
Anion gap: 7 (ref 5–15)
BUN: 10 mg/dL (ref 6–20)
CO2: 26 mmol/L (ref 22–32)
Calcium: 8.3 mg/dL — ABNORMAL LOW (ref 8.9–10.3)
Chloride: 103 mmol/L (ref 98–111)
Creatinine, Ser: 0.78 mg/dL (ref 0.61–1.24)
GFR, Estimated: >60 mL/min (ref >60)
Glucose, Bld: 104 mg/dL — ABNORMAL HIGH (ref 70–99)
Potassium: 3.5 mmol/L (ref 3.5–5.1)
Sodium: 136 mmol/L (ref 135–145)

## 2021-04-25 MED ORDER — GUAIFENESIN ER 600 MG PO TB12
600.0000 mg | ORAL_TABLET | Freq: Two times a day (BID) | ORAL | Status: DC | PRN
  Filled 2021-04-25: qty 1

## 2021-04-25 NOTE — Progress Notes (Signed)
PROGRESS NOTE    Bram Hasser  [id number] DOB: November 16, 1965 DOA: 04/21/2021 PCP: Haywood Pao, MD    Brief Narrative:  55 y.o. male with history of colon cancer s/p resection, history of gastric sleeve surgery, hypertension, sleep apnea presents to the ER with complaint of abdominal pain with nausea but denies any vomiting.  Last bowel movement was more than 24 hours ago.  Abdominal pain is diffuse.  CT imaging had findings worrisome for SBO with transition point. General Surgery was consulted  Assessment & Plan:   Principal Problem:   SBO (small bowel obstruction) (HCC) Active Problems:   Obstructive sleep apnea of adult   Essential hypertension   Small bowel obstruction noted on CT scan  Transition zone noted on CT, General Surgery following Underwent surgery 7/29. Appreciate input by General Surgery. Cont to encourage ambulation Advance diet per General Surgery Hypertension  Continue PRN IV hydralazine as needed BP stable. Continued on home norvasc History of sleep apnea on CPAP. History of colon cancer s/p resection and history of gastric sleeve surgery. Cont as per General Surgery   DVT prophylaxis: SCD's Code Status: Full Family Communication: Pt in room, family not at bedside  Status is: Inpatient  Remains inpatient appropriate because:Inpatient level of care appropriate due to severity of illness  Dispo: The patient is from: Home              Anticipated d/c is to: Home              Patient currently is not medically stable to d/c.   Difficult to place patient No   Consultants:  General Surgery  Procedures:    Antimicrobials: Anti-infectives (From admission, onward)    Start     Dose/Rate Route Frequency Ordered Stop   04/22/21 1415  ceFAZolin (ANCEF) IVPB 3g/100 mL premix  Status:  Discontinued        3 g 200 mL/hr over 30 Minutes Intravenous  Once 04/22/21 1404 04/22/21 2016   04/22/21 1400  ceFAZolin (ANCEF) 3 g in dextrose 5 %  50 mL IVPB  Status:  Discontinued        3 g 100 mL/hr over 30 Minutes Intravenous  Once 04/22/21 1356 04/22/21 1404       Subjective: States feeling better. Ambulating regularly on floor  Objective: Vitals:   04/24/21 1437 04/24/21 2119 04/25/21 0501 04/25/21 1313  BP: (!) 155/84 (!) 156/91 134/83 (!) 157/83  Pulse: (!) 105 100 (!) 110 (!) 102  Resp: '14 16 16 18  '$ Temp: 99.7 F (37.6 C) 100.1 F (37.8 C) 98.7 F (37.1 C) 98.2 F (36.8 C)  TempSrc: Oral Oral Oral Oral  SpO2: 96% 98% 97% 100%  Weight:      Height:        Intake/Output Summary (Last 24 hours) at 04/25/2021 1726 Last data filed at 04/25/2021 1600 Gross per 24 hour  Intake 3423.37 ml  Output 2565 ml  Net 858.37 ml    Filed Weights   04/21/21 1400  Weight: (!) 149.7 kg    Examination: General exam: Conversant, in no acute distress Respiratory system: normal chest rise, clear, no audible wheezing Cardiovascular system: regular rhythm, s1-s2 Gastrointestinal system: Nondistended, nontender, pos BS Central nervous system: No seizures, no tremors Extremities: No cyanosis, no joint deformities Skin: No rashes, no pallor Psychiatry: Affect normal // no auditory hallucinations   Data Reviewed: I have personally reviewed following labs and imaging studies  CBC: Recent Labs  Lab 04/21/21 0420  04/22/21 0515 04/23/21 0529  WBC 7.7 11.4* 11.3*  NEUTROABS 6.9  --   --   HGB 16.3 17.3* 15.5  HCT 50.7 53.0* 49.4  MCV 90.4 89.4 93.0  PLT 175 209 [id number]    Basic Metabolic Panel: Recent Labs  Lab 04/21/21 0506 04/22/21 0515 04/23/21 0529 04/24/21 0521 04/25/21 0401  NA 141 140 141 138 136  K 4.3 3.9 4.0 3.4* 3.5  CL 106 103 104 104 103  CO2 '26 26 27 28 26  '$ GLUCOSE 149* 148* 114* 110* 104*  BUN '12 10 12 12 10  '$ CREATININE 0.88 1.05 1.06 0.88 0.78  CALCIUM 9.6 9.4 8.8* 9.0 8.3*    GFR: Estimated Creatinine Clearance: 158.7 mL/min (by C-G formula based on SCr of 0.78 mg/dL). Liver Function  Tests: Recent Labs  Lab 04/21/21 0506 04/23/21 0529  AST 28 18  ALT 40 21  ALKPHOS 45 38  BILITOT 0.6 1.6*  PROT 7.1 6.4*  ALBUMIN 4.2 3.9    Recent Labs  Lab 04/21/21 0506  LIPASE 28    No results for input(s): AMMONIA in the last 168 hours. Coagulation Profile: No results for input(s): INR, PROTIME in the last 168 hours. Cardiac Enzymes: No results for input(s): CKTOTAL, CKMB, CKMBINDEX, TROPONINI in the last 168 hours. BNP (last 3 results) No results for input(s): PROBNP in the last 8760 hours. HbA1C: No results for input(s): HGBA1C in the last 72 hours. CBG: Recent Labs  Lab 04/22/21 0758 04/22/21 2027 04/23/21 0333 04/23/21 0728 04/23/21 1609  GLUCAP 121* 101* 91 124* 91    Lipid Profile: No results for input(s): CHOL, HDL, LDLCALC, TRIG, CHOLHDL, LDLDIRECT in the last 72 hours. Thyroid Function Tests: No results for input(s): TSH, T4TOTAL, FREET4, T3FREE, THYROIDAB in the last 72 hours. Anemia Panel: No results for input(s): VITAMINB12, FOLATE, FERRITIN, TIBC, IRON, RETICCTPCT in the last 72 hours. Sepsis Labs: No results for input(s): PROCALCITON, LATICACIDVEN in the last 168 hours.  Recent Results (from the past 240 hour(s))  Resp Panel by RT-PCR (Flu A&B, Covid) Nasopharyngeal Swab     Status: None   Collection Time: 04/21/21 11:46 AM   Specimen: Nasopharyngeal Swab; Nasopharyngeal(NP) swabs in vial transport medium  Result Value Ref Range Status   SARS Coronavirus 2 by RT PCR NEGATIVE NEGATIVE Final    Comment: (NOTE) SARS-CoV-2 target nucleic acids are NOT DETECTED.  The SARS-CoV-2 RNA is generally detectable in upper respiratory specimens during the acute phase of infection. The lowest concentration of SARS-CoV-2 viral copies this assay can detect is 138 copies/mL. A negative result does not preclude SARS-Cov-2 infection and should not be used as the sole basis for treatment or other patient management decisions. A negative result may occur  with  improper specimen collection/handling, submission of specimen other than nasopharyngeal swab, presence of viral mutation(s) within the areas targeted by this assay, and inadequate number of viral copies(<138 copies/mL). A negative result must be combined with clinical observations, patient history, and epidemiological information. The expected result is Negative.  Fact Sheet for Patients:  EntrepreneurPulse.com.au  Fact Sheet for Healthcare Providers:  IncredibleEmployment.be  This test is no t yet approved or cleared by the [place] FDA and  has been authorized for detection and/or diagnosis of SARS-CoV-2 by FDA under an Emergency Use Authorization (EUA). This EUA will remain  in effect (meaning this test can be used) for the duration of the COVID-19 declaration under Section 564(b)(1) of the Act, 21 U.S.C.section 360bbb-3(b)(1), unless the authorization is terminated  or revoked sooner.       Influenza A by PCR NEGATIVE NEGATIVE Final   Influenza B by PCR NEGATIVE NEGATIVE Final    Comment: (NOTE) The Xpert Xpress SARS-CoV-2/FLU/RSV plus assay is intended as an aid in the diagnosis of influenza from Nasopharyngeal swab specimens and should not be used as a sole basis for treatment. Nasal washings and aspirates are unacceptable for Xpert Xpress SARS-CoV-2/FLU/RSV testing.  Fact Sheet for Patients: EntrepreneurPulse.com.au  Fact Sheet for Healthcare Providers: IncredibleEmployment.be  This test is not yet approved or cleared by the [place] FDA and has been authorized for detection and/or diagnosis of SARS-CoV-2 by FDA under an Emergency Use Authorization (EUA). This EUA will remain in effect (meaning this test can be used) for the duration of the COVID-19 declaration under Section 564(b)(1) of the Act, 21 U.S.C. section 360bbb-3(b)(1), unless the authorization is terminated  or revoked.  Performed at [facility], [place] [address] [place], [place] [address]   Surgical PCR screen     Status: None   Collection Time: 04/22/21 12:01 PM   Specimen: Nasal Mucosa; Nasal Swab  Result Value Ref Range Status   MRSA, PCR NEGATIVE NEGATIVE Final   Staphylococcus aureus NEGATIVE NEGATIVE Final    Comment: (NOTE) The Xpert SA Assay (FDA approved for NASAL specimens in patients 42 years of age and older), is one component of a comprehensive surveillance program. It is not intended to diagnose infection nor to guide or monitor treatment. Performed at [facility], [place] [address] [place], [place] [address]       Radiology Studies: No results found.  Scheduled Meds:  amLODipine  10 mg Per Tube Daily   gabapentin  300 mg Oral TID   metoCLOPramide (REGLAN) injection  10 mg Intravenous Q6H   Continuous Infusions:  lactated ringers 50 mL/hr at 04/25/21 1255   methocarbamol (ROBAXIN) IV       LOS: 4 days   Marylu Lund, MD [facility] Pager On Amion  If 7PM-7AM, please contact night-coverage 04/25/2021, 5:26 PM

## 2021-04-25 NOTE — Progress Notes (Signed)
Progress Note  3 Days Post-Op  Subjective: Patient reports pain well controlled. Mobilizing well. Had a BM. Denies nausea with sips of clears.   Objective: Vital signs in last 24 hours: Temp:  [98.7 F (37.1 C)-100.1 F (37.8 C)] 98.7 F (37.1 C) (08/01 0501) Pulse Rate:  [100-123] 110 (08/01 0501) Resp:  [12-16] 16 (08/01 0501) BP: (134-156)/(83-91) 134/83 (08/01 0501) SpO2:  [96 %-98 %] 97 % (08/01 0501) Last BM Date: 04/21/21  Intake/Output from previous day: 07/31 0701 - 08/01 0700 In: 3074.6 [P.O.:1080; I.V.:1994.6] Out: 2430 [Urine:2100; Drains:330] Intake/Output this shift: Total I/O In: 120 [P.O.:120] Out: 715 [Urine:650; Drains:65]  PE: General: pleasant, WD, obese male who is sitting up in NAD Heart: regular, rate, and rhythm.   Lungs:   Respiratory effort nonlabored Abd: soft, appropriately ttp, ND, incision c/d/I, drains both SS Psych: A&Ox3 with an appropriate affect.    Lab Results:  Recent Labs    04/23/21 0529  WBC 11.3*  HGB 15.5  HCT 49.4  PLT 169   BMET Recent Labs    04/24/21 0521 04/25/21 0401  NA 138 136  K 3.4* 3.5  CL 104 103  CO2 28 26  GLUCOSE 110* 104*  BUN 12 10  CREATININE 0.88 0.78  CALCIUM 9.0 8.3*   PT/INR No results for input(s): LABPROT, INR in the last 72 hours. CMP     Component Value Date/Time   NA 136 04/25/2021 0401   K 3.5 04/25/2021 0401   CL 103 04/25/2021 0401   CO2 26 04/25/2021 0401   GLUCOSE 104 (H) 04/25/2021 0401   BUN 10 04/25/2021 0401   CREATININE 0.78 04/25/2021 0401   CALCIUM 8.3 (L) 04/25/2021 0401   PROT 6.4 (L) 04/23/2021 0529   ALBUMIN 3.9 04/23/2021 0529   AST 18 04/23/2021 0529   ALT 21 04/23/2021 0529   ALKPHOS 38 04/23/2021 0529   BILITOT 1.6 (H) 04/23/2021 0529   GFRNONAA >60 04/25/2021 0401   GFRAA >60 08/19/2019 0840   Lipase     Component Value Date/Time   LIPASE 28 04/21/2021 0506       Studies/Results: No results found.  Anti-infectives: Anti-infectives  (From admission, onward)    Start     Dose/Rate Route Frequency Ordered Stop   04/22/21 1415  ceFAZolin (ANCEF) IVPB 3g/100 mL premix  Status:  Discontinued        3 g 200 mL/hr over 30 Minutes Intravenous  Once 04/22/21 1404 04/22/21 2016   04/22/21 1400  ceFAZolin (ANCEF) 3 g in dextrose 5 % 50 mL IVPB  Status:  Discontinued        3 g 100 mL/hr over 30 Minutes Intravenous  Once 04/22/21 1356 04/22/21 1404        Assessment/Plan Ventral hernia with SBO POD3 S/P ex-lap, LOA, bilateral posterior rectus sheath myofascial release, incisional hernia repair 04/22/21 Dr. Thermon Leyland - patient tolerating sips of clears and having bowel function - advance to CLD and then as tolerated to FLD - continue to mobilize - binder when OOB - superior drain is in the SQ and is SS, inferior drain located retro-muscular and also SS - likely remove inferior drain prior to discharge  FEN: CLD and ADAT to FLD, decreased IVF to 50 cc/h VTE: start LMWH ID: ancef periop  HTN HLD GERD OSA Hx of colon cancer Morbid obesity - BMI 46.03  LOS: 4 days    Norm Parcel, [facility] 04/25/2021, 12:20 PM Please see Amion for  pager number during day hours 7:00am-4:30pm

## 2021-04-26 DIAGNOSIS — I10 Essential (primary) hypertension: Secondary | ICD-10-CM | POA: Diagnosis not present

## 2021-04-26 DIAGNOSIS — K56609 Unspecified intestinal obstruction, unspecified as to partial versus complete obstruction: Secondary | ICD-10-CM | POA: Diagnosis not present

## 2021-04-26 DIAGNOSIS — G4733 Obstructive sleep apnea (adult) (pediatric): Secondary | ICD-10-CM

## 2021-04-26 LAB — BASIC METABOLIC PANEL
Anion gap: 6 (ref 5–15)
BUN: 9 mg/dL (ref 6–20)
CO2: 25 mmol/L (ref 22–32)
Calcium: 8.2 mg/dL — ABNORMAL LOW (ref 8.9–10.3)
Chloride: 105 mmol/L (ref 98–111)
Creatinine, Ser: 0.86 mg/dL (ref 0.61–1.24)
GFR, Estimated: >60 mL/min (ref >60)
Glucose, Bld: 103 mg/dL — ABNORMAL HIGH (ref 70–99)
Potassium: 3.5 mmol/L (ref 3.5–5.1)
Sodium: 136 mmol/L (ref 135–145)

## 2021-04-26 LAB — CBC
HCT: 41.8 % (ref 39.0–52.0)
Hemoglobin: 13.2 g/dL (ref 13.0–17.0)
MCH: 28.7 pg (ref 26.0–34.0)
MCHC: 31.6 g/dL (ref 30.0–36.0)
MCV: 90.9 fL (ref 80.0–100.0)
Platelets: 183 10*3/uL (ref 150–400)
RBC: 4.6 MIL/uL (ref 4.22–5.81)
RDW: 13.1 % (ref 11.5–15.5)
WBC: 7.2 10*3/uL (ref 4.0–10.5)
nRBC: 0 % (ref 0.0–0.2)

## 2021-04-26 MED ORDER — SIMETHICONE 80 MG PO CHEW
80.0000 mg | CHEWABLE_TABLET | Freq: Four times a day (QID) | ORAL | Status: DC | PRN
Start: 2021-04-26 — End: 2021-04-27

## 2021-04-26 MED ORDER — OXYCODONE HCL 5 MG PO TABS: 5.0000 mg | ORAL_TABLET | ORAL | Status: DC | PRN

## 2021-04-26 MED ORDER — AMLODIPINE BESYLATE 10 MG PO TABS
10.0000 mg | ORAL_TABLET | Freq: Every day | ORAL | Status: DC
  Administered 2021-04-27: 10 mg via ORAL
  Filled 2021-04-26: qty 1

## 2021-04-26 MED ORDER — ENOXAPARIN SODIUM 80 MG/0.8ML IJ SOSY: 70.0000 mg | PREFILLED_SYRINGE | INTRAMUSCULAR | Status: DC

## 2021-04-26 MED ORDER — POTASSIUM CHLORIDE CRYS ER 20 MEQ PO TBCR
60.0000 meq | EXTENDED_RELEASE_TABLET | Freq: Once | ORAL | Status: AC
  Administered 2021-04-26: 60 meq via ORAL
  Filled 2021-04-26: qty 3

## 2021-04-26 MED ORDER — POLYETHYLENE GLYCOL 3350 17 G PO PACK
17.0000 g | PACK | Freq: Every day | ORAL | Status: DC
  Administered 2021-04-26 – 2021-04-27 (×2): 17 g via ORAL
  Filled 2021-04-26 (×2): qty 1

## 2021-04-26 MED ORDER — OXYCODONE HCL 5 MG PO TABS
10.0000 mg | ORAL_TABLET | ORAL | Status: DC | PRN
Start: 2021-04-26 — End: 2021-04-27
  Administered 2021-04-26: 10 mg via ORAL
  Filled 2021-04-26: qty 2

## 2021-04-26 MED ORDER — METHOCARBAMOL 500 MG PO TABS: 500.0000 mg | ORAL_TABLET | Freq: Four times a day (QID) | ORAL | Status: DC | PRN

## 2021-04-26 MED ORDER — ENOXAPARIN SODIUM 40 MG/0.4ML IJ SOSY: 40.0000 mg | PREFILLED_SYRINGE | INTRAMUSCULAR | Status: DC

## 2021-04-26 MED ORDER — DOCUSATE SODIUM 100 MG PO CAPS
100.0000 mg | ORAL_CAPSULE | Freq: Two times a day (BID) | ORAL | Status: DC
  Administered 2021-04-26 – 2021-04-27 (×3): 100 mg via ORAL
  Filled 2021-04-26 (×3): qty 1

## 2021-04-26 NOTE — Discharge Instructions (Signed)
[facility]      [facility] °[phone] ° °OPEN ABDOMINAL SURGERY: POST OP INSTRUCTIONS ° °Always review your discharge instruction sheet given to you by the facility where your surgery was performed. ° °IF YOU HAVE DISABILITY OR FAMILY LEAVE FORMS, YOU MUST BRING THEM TO THE OFFICE FOR PROCESSING.  PLEASE DO NOT GIVE THEM TO YOUR DOCTOR. ° °A prescription for pain medication may be given to you upon discharge.  Take your pain medication as prescribed, if needed.  If narcotic pain medicine is not needed, then you may take acetaminophen (Tylenol) or ibuprofen (Advil) as needed. °Take your usually prescribed medications unless otherwise directed. °If you need a refill on your pain medication, please contact your pharmacy. They will contact our office to request authorization.  Prescriptions will not be filled after 5pm or on week-ends. °You should follow a light diet the first few days after arrival home, such as soup and crackers, pudding, etc.unless your doctor has advised otherwise. A high-fiber, low fat diet can be resumed as tolerated.   Be sure to include lots of fluids daily. Most patients will experience some swelling and bruising on the chest and neck area.  Ice packs will help.  Swelling and bruising can take several days to resolve °Most patients will experience some swelling and bruising in the area of the incision. Ice pack will help. Swelling and bruising can take several days to resolve..  °It is common to experience some constipation if taking pain medication after surgery.  Increasing fluid intake and taking a stool softener will usually help or prevent this problem from occurring.  A mild laxative (Milk of Magnesia or Miralax) should be taken according to package directions if there are no bowel movements after 48 hours. ° You may have steri-strips (small skin tapes) in place directly over the incision.  These strips should be left on the skin for 7-10 days.  If your surgeon used skin  glue on the incision, you may shower in 24 hours.  The glue will flake off over the next 2-3 weeks.  Any sutures or staples will be removed at the office during your follow-up visit. You may find that a light gauze bandage over your incision may keep your staples from being rubbed or pulled. You may shower and replace the bandage daily. °ACTIVITIES:  You may resume regular (light) daily activities beginning the next day--such as daily self-care, walking, climbing stairs--gradually increasing activities as tolerated.  You may have sexual intercourse when it is comfortable.  Refrain from any heavy lifting or straining until approved by your doctor. °You may drive when you no longer are taking prescription pain medication, you can comfortably wear a seatbelt, and you can safely maneuver your car and apply brakes ° °You should see your doctor in the office for a follow-up appointment approximately two weeks after your surgery.  Make sure that you call for this appointment within a day or two after you arrive home to insure a convenient appointment time. ° °WHEN TO CALL YOUR DOCTOR: °Fever over 101.0 °Inability to urinate °Nausea and/or vomiting °Extreme swelling or bruising °Continued bleeding from incision. °Increased pain, redness, or drainage from the incision. °Difficulty swallowing or breathing °Muscle cramping or spasms. °Numbness or tingling in hands or feet or around lips. ° °The clinic staff is available to answer your questions during regular business hours.  Please don’t hesitate to call and ask to speak to one of the nurses if you have concerns. ° °For   further questions, please visit [email or web address]  °

## 2021-04-26 NOTE — Progress Notes (Signed)
   Progress Note  4 Days Post-Op  Subjective: Tolerating CLD, passing flatus. Has not yet been advanced to FLD. Pain well controlled.   Objective: Vital signs in last 24 hours: Temp:  [98.2 F (36.8 C)-99.3 F (37.4 C)] 99.3 F (37.4 C) (08/02 0518) Pulse Rate:  [100-115] 100 (08/02 0518) Resp:  [16-18] 18 (08/02 0518) BP: (111-157)/(68-83) 139/68 (08/02 0518) SpO2:  [95 %-100 %] 95 % (08/02 0518) Last BM Date: 04/25/21  Intake/Output from previous day: 08/01 0701 - 08/02 0700 In: 3268.6 [P.O.:1560; I.V.:1708.6] Out: 2380 [Urine:2150; Drains:230] Intake/Output this shift: Total I/O In: 480 [P.O.:480] Out: 1100 [Urine:1100]  PE: General: pleasant, WD, obese male who is sitting up in NAD Heart: regular, rate, and rhythm.   Lungs:   Respiratory effort nonlabored Abd: soft, appropriately ttp, ND, incision c/d/I, drains both SS Psych: A&Ox3 with an appropriate affect.   Lab Results:  Recent Labs    04/26/21 0359  WBC 7.2  HGB 13.2  HCT 41.8  PLT 183   BMET Recent Labs    04/25/21 0401 04/26/21 0359  NA 136 136  K 3.5 3.5  CL 103 105  CO2 26 25  GLUCOSE 104* 103*  BUN 10 9  CREATININE 0.78 0.86  CALCIUM 8.3* 8.2*   PT/INR No results for input(s): LABPROT, INR in the last 72 hours. CMP     Component Value Date/Time   NA 136 04/26/2021 0359   K 3.5 04/26/2021 0359   CL 105 04/26/2021 0359   CO2 25 04/26/2021 0359   GLUCOSE 103 (H) 04/26/2021 0359   BUN 9 04/26/2021 0359   CREATININE 0.86 04/26/2021 0359   CALCIUM 8.2 (L) 04/26/2021 0359   PROT 6.4 (L) 04/23/2021 0529   ALBUMIN 3.9 04/23/2021 0529   AST 18 04/23/2021 0529   ALT 21 04/23/2021 0529   ALKPHOS 38 04/23/2021 0529   BILITOT 1.6 (H) 04/23/2021 0529   GFRNONAA >60 04/26/2021 0359   GFRAA >60 08/19/2019 0840   Lipase     Component Value Date/Time   LIPASE 28 04/21/2021 0506       Studies/Results: No results found.  Anti-infectives: Anti-infectives (From admission, onward)     Start     Dose/Rate Route Frequency Ordered Stop   04/22/21 1415  ceFAZolin (ANCEF) IVPB 3g/100 mL premix  Status:  Discontinued        3 g 200 mL/hr over 30 Minutes Intravenous  Once 04/22/21 1404 04/22/21 2016   04/22/21 1400  ceFAZolin (ANCEF) 3 g in dextrose 5 % 50 mL IVPB  Status:  Discontinued        3 g 100 mL/hr over 30 Minutes Intravenous  Once 04/22/21 1356 04/22/21 1404        Assessment/Plan Ventral hernia with SBO POD4 S/P ex-lap, LOA, bilateral posterior rectus sheath myofascial release, incisional hernia repair 04/22/21 Dr. Thermon Leyland - advance to FLD and will advance to soft this evening if tolerating ok  - continue to mobilize - binder when OOB - superior drain is in the SQ and is SS, inferior drain located retro-muscular and also SS - likely remove inferior drain prior to discharge   FEN: FLD, IVF @ 50 cc/h VTE: LMWH ID: ancef periop   HTN HLD GERD OSA Hx of colon cancer Morbid obesity - BMI 46.03  LOS: 5 days    Cameron Jones, [facility] 04/26/2021, 12:27 PM Please see Amion for pager number during day hours 7:00am-4:30pm

## 2021-04-26 NOTE — Progress Notes (Signed)
PROGRESS NOTE    Cameron Jones  [id number] DOB: 1965-10-03 DOA: 04/21/2021 PCP: Haywood Pao, MD    Brief Narrative:  55 y.o. male with history of colon cancer s/p resection, history of gastric sleeve surgery, hypertension, sleep apnea presents to the ER with complaint of abdominal pain with nausea but denies any vomiting.  Last bowel movement was more than 24 hours ago.  Abdominal pain is diffuse.  CT imaging had findings worrisome for SBO with transition point. General Surgery was consulted  Assessment & Plan:   Principal Problem:   SBO (small bowel obstruction) (HCC) Active Problems:   Obstructive sleep apnea of adult   Essential hypertension   Small bowel obstruction noted on CT scan  Transition zone noted on CT, General Surgery had been following Underwent surgery 7/29. Appreciate input by General Surgery. Successfully advancing diet Pt is ambulating frequently in hallway Cont to correct electrolytes as needed Hypertension  Continue PRN IV hydralazine as needed BP stable. Continued on home norvasc History of sleep apnea on CPAP. History of colon cancer s/p resection and history of gastric sleeve surgery. Cont as per General Surgery Seems stable at this time   DVT prophylaxis: SCD's Code Status: Full Family Communication: Pt in room, family not at bedside  Status is: Inpatient  Remains inpatient appropriate because:Inpatient level of care appropriate due to severity of illness  Dispo: The patient is from: Home              Anticipated d/c is to: Home              Patient currently is not medically stable to d/c.   Difficult to place patient No   Consultants:  General Surgery  Procedures:    Antimicrobials: Anti-infectives (From admission, onward)    Start     Dose/Rate Route Frequency Ordered Stop   04/22/21 1415  ceFAZolin (ANCEF) IVPB 3g/100 mL premix  Status:  Discontinued        3 g 200 mL/hr over 30 Minutes Intravenous  Once  04/22/21 1404 04/22/21 2016   04/22/21 1400  ceFAZolin (ANCEF) 3 g in dextrose 5 % 50 mL IVPB  Status:  Discontinued        3 g 100 mL/hr over 30 Minutes Intravenous  Once 04/22/21 1356 04/22/21 1404       Subjective: Reports feeling better. Ambulating in hallway frequently  Objective: Vitals:   04/25/21 2119 04/25/21 2300 04/26/21 0518 04/26/21 1407  BP: 111/75 132/77 139/68 138/84  Pulse: (!) 115 (!) 103 100 (!) 108  Resp: '18 16 18 18  '$ Temp: 99 F (37.2 C) 99.2 F (37.3 C) 99.3 F (37.4 C) 98.6 F (37 C)  TempSrc:  Oral Oral Oral  SpO2: 97% 98% 95% 97%  Weight:      Height:        Intake/Output Summary (Last 24 hours) at 04/26/2021 1551 Last data filed at 04/26/2021 1400 Gross per 24 hour  Intake 2877.41 ml  Output 2550 ml  Net 327.41 ml    Filed Weights   04/21/21 1400  Weight: (!) 149.7 kg    Examination: General exam: Awake, laying in bed, in nad Respiratory system: Normal respiratory effort, no wheezing Cardiovascular system: regular rate, s1, s2 Gastrointestinal system: Soft, nondistended, positive BS Central nervous system: CN2-12 grossly intact, strength intact Extremities: Perfused, no clubbing Skin: Normal skin turgor, no notable skin lesions seen Psychiatry: Mood normal // no visual hallucinations   Data Reviewed: I have personally reviewed  following labs and imaging studies  CBC: Recent Labs  Lab 04/21/21 0420 04/22/21 0515 04/23/21 0529 04/26/21 0359  WBC 7.7 11.4* 11.3* 7.2  NEUTROABS 6.9  --   --   --   HGB 16.3 17.3* 15.5 13.2  HCT 50.7 53.0* 49.4 41.8  MCV 90.4 89.4 93.0 90.9  PLT 175 209 169 [id number]    Basic Metabolic Panel: Recent Labs  Lab 04/22/21 0515 04/23/21 0529 04/24/21 0521 04/25/21 0401 04/26/21 0359  NA 140 141 138 136 136  K 3.9 4.0 3.4* 3.5 3.5  CL 103 104 104 103 105  CO2 '26 27 28 26 25  '$ GLUCOSE 148* 114* 110* 104* 103*  BUN '10 12 12 10 9  '$ CREATININE 1.05 1.06 0.88 0.78 0.86  CALCIUM 9.4 8.8* 9.0 8.3* 8.2*     GFR: Estimated Creatinine Clearance: 147.7 mL/min (by C-G formula based on SCr of 0.86 mg/dL). Liver Function Tests: Recent Labs  Lab 04/21/21 0506 04/23/21 0529  AST 28 18  ALT 40 21  ALKPHOS 45 38  BILITOT 0.6 1.6*  PROT 7.1 6.4*  ALBUMIN 4.2 3.9    Recent Labs  Lab 04/21/21 0506  LIPASE 28    No results for input(s): AMMONIA in the last 168 hours. Coagulation Profile: No results for input(s): INR, PROTIME in the last 168 hours. Cardiac Enzymes: No results for input(s): CKTOTAL, CKMB, CKMBINDEX, TROPONINI in the last 168 hours. BNP (last 3 results) No results for input(s): PROBNP in the last 8760 hours. HbA1C: No results for input(s): HGBA1C in the last 72 hours. CBG: Recent Labs  Lab 04/22/21 0758 04/22/21 2027 04/23/21 0333 04/23/21 0728 04/23/21 1609  GLUCAP 121* 101* 91 124* 91    Lipid Profile: No results for input(s): CHOL, HDL, LDLCALC, TRIG, CHOLHDL, LDLDIRECT in the last 72 hours. Thyroid Function Tests: No results for input(s): TSH, T4TOTAL, FREET4, T3FREE, THYROIDAB in the last 72 hours. Anemia Panel: No results for input(s): VITAMINB12, FOLATE, FERRITIN, TIBC, IRON, RETICCTPCT in the last 72 hours. Sepsis Labs: No results for input(s): PROCALCITON, LATICACIDVEN in the last 168 hours.  Recent Results (from the past 240 hour(s))  Resp Panel by RT-PCR (Flu A&B, Covid) Nasopharyngeal Swab     Status: None   Collection Time: 04/21/21 11:46 AM   Specimen: Nasopharyngeal Swab; Nasopharyngeal(NP) swabs in vial transport medium  Result Value Ref Range Status   SARS Coronavirus 2 by RT PCR NEGATIVE NEGATIVE Final    Comment: (NOTE) SARS-CoV-2 target nucleic acids are NOT DETECTED.  The SARS-CoV-2 RNA is generally detectable in upper respiratory specimens during the acute phase of infection. The lowest concentration of SARS-CoV-2 viral copies this assay can detect is 138 copies/mL. A negative result does not preclude SARS-Cov-2 infection and  should not be used as the sole basis for treatment or other patient management decisions. A negative result may occur with  improper specimen collection/handling, submission of specimen other than nasopharyngeal swab, presence of viral mutation(s) within the areas targeted by this assay, and inadequate number of viral copies(<138 copies/mL). A negative result must be combined with clinical observations, patient history, and epidemiological information. The expected result is Negative.  Fact Sheet for Patients:  EntrepreneurPulse.com.au  Fact Sheet for Healthcare Providers:  IncredibleEmployment.be  This test is no t yet approved or cleared by the [place] FDA and  has been authorized for detection and/or diagnosis of SARS-CoV-2 by FDA under an Emergency Use Authorization (EUA). This EUA will remain  in effect (meaning this test can be  used) for the duration of the COVID-19 declaration under Section 564(b)(1) of the Act, 21 U.S.C.section 360bbb-3(b)(1), unless the authorization is terminated  or revoked sooner.       Influenza A by PCR NEGATIVE NEGATIVE Final   Influenza B by PCR NEGATIVE NEGATIVE Final    Comment: (NOTE) The Xpert Xpress SARS-CoV-2/FLU/RSV plus assay is intended as an aid in the diagnosis of influenza from Nasopharyngeal swab specimens and should not be used as a sole basis for treatment. Nasal washings and aspirates are unacceptable for Xpert Xpress SARS-CoV-2/FLU/RSV testing.  Fact Sheet for Patients: EntrepreneurPulse.com.au  Fact Sheet for Healthcare Providers: IncredibleEmployment.be  This test is not yet approved or cleared by the [place] FDA and has been authorized for detection and/or diagnosis of SARS-CoV-2 by FDA under an Emergency Use Authorization (EUA). This EUA will remain in effect (meaning this test can be used) for the duration of the COVID-19 declaration  under Section 564(b)(1) of the Act, 21 U.S.C. section 360bbb-3(b)(1), unless the authorization is terminated or revoked.  Performed at [facility], [place] [address] [place], [place] [address]   Surgical PCR screen     Status: None   Collection Time: 04/22/21 12:01 PM   Specimen: Nasal Mucosa; Nasal Swab  Result Value Ref Range Status   MRSA, PCR NEGATIVE NEGATIVE Final   Staphylococcus aureus NEGATIVE NEGATIVE Final    Comment: (NOTE) The Xpert SA Assay (FDA approved for NASAL specimens in patients 52 years of age and older), is one component of a comprehensive surveillance program. It is not intended to diagnose infection nor to guide or monitor treatment. Performed at [facility], [place] [address] [place], [place] [address]       Radiology Studies: No results found.  Scheduled Meds:  [START ON 04/27/2021] amLODipine  10 mg Oral Daily   docusate sodium  100 mg Oral BID   [START ON 04/27/2021] enoxaparin (LOVENOX) injection  70 mg Subcutaneous Q24H   gabapentin  300 mg Oral TID   metoCLOPramide (REGLAN) injection  10 mg Intravenous Q6H   polyethylene glycol  17 g Oral Daily   Continuous Infusions:  lactated ringers 50 mL/hr at 04/26/21 1154     LOS: 5 days   Marylu Lund, MD [facility] Pager On Amion  If 7PM-7AM, please contact night-coverage 04/26/2021, 3:51 PM

## 2021-04-27 DIAGNOSIS — K56609 Unspecified intestinal obstruction, unspecified as to partial versus complete obstruction: Secondary | ICD-10-CM | POA: Diagnosis not present

## 2021-04-27 DIAGNOSIS — G4733 Obstructive sleep apnea (adult) (pediatric): Secondary | ICD-10-CM | POA: Diagnosis not present

## 2021-04-27 DIAGNOSIS — I10 Essential (primary) hypertension: Secondary | ICD-10-CM | POA: Diagnosis not present

## 2021-04-27 LAB — BASIC METABOLIC PANEL
Anion gap: 10 (ref 5–15)
BUN: 7 mg/dL (ref 6–20)
CO2: 24 mmol/L (ref 22–32)
Calcium: 8.6 mg/dL — ABNORMAL LOW (ref 8.9–10.3)
Chloride: 102 mmol/L (ref 98–111)
Creatinine, Ser: 0.8 mg/dL (ref 0.61–1.24)
GFR, Estimated: >60 mL/min (ref >60)
Glucose, Bld: 93 mg/dL (ref 70–99)
Potassium: 3.6 mmol/L (ref 3.5–5.1)
Sodium: 136 mmol/L (ref 135–145)

## 2021-04-27 MED ORDER — OXYCODONE HCL 5 MG PO TABS: 5.0000 mg | ORAL_TABLET | Freq: Four times a day (QID) | ORAL | 0 refills | Status: AC | PRN

## 2021-04-27 MED ORDER — GABAPENTIN 300 MG PO CAPS: ORAL_CAPSULE | ORAL | 0 refills | Status: AC

## 2021-04-27 MED ORDER — METHOCARBAMOL 500 MG PO TABS: 500.0000 mg | ORAL_TABLET | Freq: Four times a day (QID) | ORAL | 0 refills | Status: AC | PRN

## 2021-04-27 MED ORDER — POLYETHYLENE GLYCOL 3350 17 G PO PACK: 17.0000 g | PACK | Freq: Every day | ORAL | Status: AC | PRN

## 2021-04-27 MED ORDER — DOCUSATE SODIUM 100 MG PO CAPS: 100.0000 mg | ORAL_CAPSULE | Freq: Every day | ORAL | Status: AC | PRN

## 2021-04-27 NOTE — Discharge Summary (Addendum)
Physician Discharge Summary  Roberta Farleigh [id number] DOB: Apr 19, 1966 DOA: 04/21/2021  PCP: Haywood Pao, MD  Admit date: 04/21/2021 Discharge date: 04/27/2021  Time spent: 60 minutes  Recommendations for Outpatient follow-up:  Follow-up general surgery in 1 week  Discharge Diagnoses:  Principal Problem:   SBO (small bowel obstruction) ([place]) Active Problems:   Obstructive sleep apnea of adult   Essential hypertension   Discharge Condition: Stable  Diet recommendation: Heart healthy diet  Filed Weights   04/21/21 1400  Weight: (!) 149.7 kg    History of present illness:  55 year old male with history of colon cancer s/p resection, history of gastric sleeve surgery, hypertension, came to ED with complaints of abdominal pain with nausea and vomiting CT abdomen pelvis showed small bowel obstruction due to strictured narrowing on DC process from right side of the abdominal wall hernia.  General surgery was consulted.  Hospital Course:  Small bowel obstruction -Resolved -S/p laparotomy, incisional hernia repair -Patient is tolerating diet well. -Pain well controlled; general surgery has signed off -Patient will be discharged home and follow-up with general surgery in 1 week. -Inferior JP drain has been removed, superior JP drain in place.  JP drain care explained to patient.  Hypertension -Blood pressure is stable -Continue amlodipine  History of sleep apnea -Continue CPAP  History of colon cancer s/p resection and history of gastric sleeve surgery -Followed by general surgery as outpatient  Procedures: Ventral hernia repair  Consultations: General surgery  Discharge Exam: Vitals:   04/26/21 2233 04/27/21 0621  BP: (!) 141/86 (!) 147/85  Pulse: 95 94  Resp: 17 17  Temp: 99.7 F (37.6 C) 98.7 F (37.1 C)  SpO2: 97% 98%    General-appears in no acute distress Heart-S1-S2, regular, no murmur auscultated Lungs-clear to auscultation  bilaterally, no wheezing or crackles auscultated Abdomen-soft, nontender, no organomegaly Extremities-no edema in the lower extremities Neuro-alert, oriented x3, no focal deficit noted  Discharge Instructions   Discharge Instructions     Diet - low sodium heart healthy   Complete by: As directed    Discharge wound care:   Complete by: As directed    Drain care explained to patient by RN   Increase activity slowly   Complete by: As directed       Allergies as of 04/27/2021   No Known Allergies      Medication List     TAKE these medications    acetaminophen 500 MG tablet Commonly known as: TYLENOL Take 500 mg by mouth every 6 (six) hours as needed for moderate pain or headache.   amLODipine 10 MG tablet Commonly known as: NORVASC Take 10 mg by mouth daily.   calcium carbonate 1250 (500 Ca) MG chewable tablet Commonly known as: OS-CAL Chew 1 tablet by mouth daily.   cholecalciferol 25 MCG (1000 UNIT) tablet Commonly known as: VITAMIN D3 Take 1,000 Units by mouth daily.   docusate sodium 100 MG capsule Commonly known as: COLACE Take 1 capsule (100 mg total) by mouth daily as needed for mild constipation.   ferrous sulfate 325 (65 FE) MG tablet Take 325 mg by mouth daily with breakfast.   gabapentin 300 MG capsule Commonly known as: NEURONTIN Take 1 capsule (300 mg total) by mouth 3 (three) times daily for 10 days, THEN 1 capsule (300 mg total) 2 (two) times daily for 10 days, THEN 1 capsule (300 mg total) at bedtime for 10 days. Start taking on: April 27, 2021   methocarbamol 500 MG  tablet Commonly known as: ROBAXIN Take 1 tablet (500 mg total) by mouth every 6 (six) hours as needed for muscle spasms.   multivitamin with minerals tablet Take 1 tablet by mouth daily.   oxyCODONE 5 MG immediate release tablet Commonly known as: Oxy IR/ROXICODONE Take 1 tablet (5 mg total) by mouth every 6 (six) hours as needed for moderate pain or severe pain.    polyethylene glycol 17 g packet Commonly known as: MIRALAX / GLYCOLAX Take 17 g by mouth daily as needed for mild constipation.   vitamin B-12 1000 MCG tablet Commonly known as: CYANOCOBALAMIN Take 1,000 mcg by mouth daily.               Discharge Care Instructions  (From admission, onward)           Start     Ordered   04/27/21 0000  Discharge wound care:       Comments: Drain care explained to patient by RN   04/27/21 1248           No Known Allergies  Follow-up Information     Stechschulte, Nickola Major, MD. Go on 05/19/2021.   Specialty: Surgery Why: 9:20 AM. Please arrive 30 min prior to appointment time. Bring photo ID and insurance information with you. Contact information: [place]. [address] [place] [address] [phone]         Surgery, [place]. Go on 05/05/2021.   Specialty: General Surgery Why: 11:00 AM. Please arrive 30 min prior to appointement time. Contact information: [address] [address] [phone]                  The results of significant diagnostics from this hospitalization (including imaging, microbiology, ancillary and laboratory) are listed below for reference.    Significant Diagnostic Studies: CT ABDOMEN PELVIS WO CONTRAST  Result Date: 04/21/2021 CLINICAL DATA:  Abdominal pain since last evening. EXAM: CT ABDOMEN AND PELVIS WITHOUT CONTRAST TECHNIQUE: Multidetector CT imaging of the abdomen and pelvis was performed following the standard protocol without IV contrast. COMPARISON:  CT scan 08/25/2020 FINDINGS: Lower chest: The lung bases are clear of an acute process. Stable small Bochdalek's hernia noted on the left. The heart is normal in size. No pericardial effusion. Hepatobiliary: No hepatic lesions are identified without contrast. The gallbladder is grossly normal. No common bile duct dilatation. Pancreas: No mass, inflammation or ductal dilatation. Spleen: Normal size. No  focal lesions. Adrenals/Urinary Tract: Stable left adrenal gland adenoma. The right adrenal gland is normal. No renal calculi, hydronephrosis or renal lesions are identified. Stomach/Bowel: Stable postoperative changes involving the stomach from gastric sleeve procedure. No complicating features. The duodenum is unremarkable. Dilated mid distal small bowel loops with air-fluid levels consistent with obstruction. Findings due to strictured narrowing or adhesive process along the right-side of the abdominal wall hernia. There are non dilated small bowel loops and draining hernia on the right side. This is the same location of obstruction noted on the prior CT scan. Part of the transverse colon is also in the anterior abdominal wall hernia and is slightly distended and contains moderate stool. There are postoperative changes from a right hemicolectomy with ileocolonic anastomosis involving the colon in the hernia. Vascular/Lymphatic: Minimal scattered vascular calcifications. No aneurysm. No mesenteric or retroperitoneal mass or adenopathy. Reproductive: The prostate gland and seminal vesicles are unremarkable. Other: No pelvic mass or adenopathy. No free pelvic fluid collections. No inguinal mass or adenopathy. Musculoskeletal: No  significant bony findings. IMPRESSION: 1. CT findings consistent with a mid distal small bowel obstruction due to strictured narrowing or adhesive process along the right-side of the abdominal wall hernia. This is the same location of obstruction noted on the prior CT scan. 2. Status post right hemicolectomy with ileocolonic anastomosis involving the colon in the hernia. 3. Stable postoperative changes involving the stomach from gastric sleeve procedure. 4. Stable left adrenal gland adenoma. Electronically Signed   By: Marijo Sanes M.D.   On: 04/21/2021 10:42   DG Abd Portable 1V  Result Date: 04/22/2021 CLINICAL DATA:  Abdominal pain.  Nasogastric tube placement. EXAM: PORTABLE ABDOMEN  - 1 VIEW COMPARISON:  April 21, 2021. FINDINGS: Mildly dilated small bowel loops are noted in the upper abdomen concerning for ileus or possibly distal small bowel obstruction. Distal tip of nasogastric tube is seen in the proximal stomach. IMPRESSION: Distal tip of nasogastric tube seen in proximal stomach. Mildly dilated small bowel loops are noted in the upper abdomen concerning for possible ileus or small-bowel obstruction. Electronically Signed   By: Marijo Conception M.D.   On: 04/22/2021 10:41   DG Abd Portable 1V-Small Bowel Obstruction Protocol-initial, 8 hr delay  Result Date: 04/22/2021 CLINICAL DATA:  SBO protocol, 8 hour delay EXAM: PORTABLE ABDOMEN - 1 VIEW COMPARISON:  CT 04/13/2021, radiograph 04/21/2021 FINDINGS: Transesophageal tube tip and side port terminate distal to the GE junction. High attenuation enteric contrast media has diffuse through the small bowel. Question some opacification at the cecum of the more distal transverse colonic segments not convincingly opacified. IMPRESSION: High attenuation enteric contrast media traverses through the small bowel with questionable if any true opacification of the right hemicolon. Recommend continued follow-up imaging. Appropriate positioning of the transesophageal tube. Electronically Signed   By: Lovena Le M.D.   On: 04/22/2021 02:01   DG Abd Portable 1V-Small Bowel Protocol-Position Verification  Result Date: 04/21/2021 CLINICAL DATA:  NG tube placement. EXAM: PORTABLE ABDOMEN - 1 VIEW COMPARISON:  Abdominal CT earlier today FINDINGS: Tip and side port of the enteric tube below the diaphragm in the stomach. No dilatation of upper abdominal bowel loops. Excreted IV contrast in the renal collecting systems from prior CT. IMPRESSION: Tip and side port of the enteric tube below the diaphragm in the stomach. Electronically Signed   By: Keith Rake M.D.   On: 04/21/2021 16:44    Microbiology: Recent Results (from the past 240 hour(s))   Resp Panel by RT-PCR (Flu A&B, Covid) Nasopharyngeal Swab     Status: None   Collection Time: 04/21/21 11:46 AM   Specimen: Nasopharyngeal Swab; Nasopharyngeal(NP) swabs in vial transport medium  Result Value Ref Range Status   SARS Coronavirus 2 by RT PCR NEGATIVE NEGATIVE Final    Comment: (NOTE) SARS-CoV-2 target nucleic acids are NOT DETECTED.  The SARS-CoV-2 RNA is generally detectable in upper respiratory specimens during the acute phase of infection. The lowest concentration of SARS-CoV-2 viral copies this assay can detect is 138 copies/mL. A negative result does not preclude SARS-Cov-2 infection and should not be used as the sole basis for treatment or other patient management decisions. A negative result may occur with  improper specimen collection/handling, submission of specimen other than nasopharyngeal swab, presence of viral mutation(s) within the areas targeted by this assay, and inadequate number of viral copies(<138 copies/mL). A negative result must be combined with clinical observations, patient history, and epidemiological information. The expected result is Negative.  Fact Sheet for Patients:  EntrepreneurPulse.com.au  Fact Sheet for Healthcare Providers:  IncredibleEmployment.be  This test is no t yet approved or cleared by the [place] FDA and  has been authorized for detection and/or diagnosis of SARS-CoV-2 by FDA under an Emergency Use Authorization (EUA). This EUA will remain  in effect (meaning this test can be used) for the duration of the COVID-19 declaration under Section 564(b)(1) of the Act, 21 U.S.C.section 360bbb-3(b)(1), unless the authorization is terminated  or revoked sooner.       Influenza A by PCR NEGATIVE NEGATIVE Final   Influenza B by PCR NEGATIVE NEGATIVE Final    Comment: (NOTE) The Xpert Xpress SARS-CoV-2/FLU/RSV plus assay is intended as an aid in the diagnosis of influenza from  Nasopharyngeal swab specimens and should not be used as a sole basis for treatment. Nasal washings and aspirates are unacceptable for Xpert Xpress SARS-CoV-2/FLU/RSV testing.  Fact Sheet for Patients: EntrepreneurPulse.com.au  Fact Sheet for Healthcare Providers: IncredibleEmployment.be  This test is not yet approved or cleared by the [place] FDA and has been authorized for detection and/or diagnosis of SARS-CoV-2 by FDA under an Emergency Use Authorization (EUA). This EUA will remain in effect (meaning this test can be used) for the duration of the COVID-19 declaration under Section 564(b)(1) of the Act, 21 U.S.C. section 360bbb-3(b)(1), unless the authorization is terminated or revoked.  Performed at [facility], [place] [address], [place], [place] [address]   Surgical PCR screen     Status: None   Collection Time: 04/22/21 12:01 PM   Specimen: Nasal Mucosa; Nasal Swab  Result Value Ref Range Status   MRSA, PCR NEGATIVE NEGATIVE Final   Staphylococcus aureus NEGATIVE NEGATIVE Final    Comment: (NOTE) The Xpert SA Assay (FDA approved for NASAL specimens in patients 33 years of age and older), is one component of a comprehensive surveillance program. It is not intended to diagnose infection nor to guide or monitor treatment. Performed at [facility], [place] [address], [place], [place] [address]      Labs: Basic Metabolic Panel: Recent Labs  Lab 04/23/21 0529 04/24/21 0521 04/25/21 0401 04/26/21 0359 04/27/21 0412  NA 141 138 136 136 136  K 4.0 3.4* 3.5 3.5 3.6  CL 104 104 103 105 102  CO2 '27 28 26 25 24  '$ GLUCOSE 114* 110* 104* 103* 93  BUN '12 12 10 9 7  '$ CREATININE 1.06 0.88 0.78 0.86 0.80  CALCIUM 8.8* 9.0 8.3* 8.2* 8.6*   Liver Function Tests: Recent Labs  Lab 04/21/21 0506 04/23/21 0529  AST 28 18  ALT 40 21  ALKPHOS 45 38  BILITOT 0.6 1.6*  PROT 7.1 6.4*  ALBUMIN 4.2 3.9    Recent Labs  Lab 04/21/21 0506  LIPASE 28   No results for input(s): AMMONIA in the last 168 hours. CBC: Recent Labs  Lab 04/21/21 0420 04/22/21 0515 04/23/21 0529 04/26/21 0359  WBC 7.7 11.4* 11.3* 7.2  NEUTROABS 6.9  --   --   --   HGB 16.3 17.3* 15.5 13.2  HCT 50.7 53.0* 49.4 41.8  MCV 90.4 89.4 93.0 90.9  PLT 175 209 169 183    CBG: Recent Labs  Lab 04/22/21 0758 04/22/21 2027 04/23/21 0333 04/23/21 0728 04/23/21 1609  GLUCAP 121* 101* 91 124* 91       Signed:  Oswald Hillock MD.  [facility] 04/27/2021, 12:49 PM

## 2021-04-27 NOTE — Plan of Care (Signed)

## 2021-04-27 NOTE — Progress Notes (Signed)
Progress Note  5 Days Post-Op  Subjective: Tolerating diet and having bowel function. Plans for discharge today. Reviewed post-op instructions and follow up with patient.   Objective: Vital signs in last 24 hours: Temp:  [98.6 F (37 C)-99.7 F (37.6 C)] 98.7 F (37.1 C) (08/03 0621) Pulse Rate:  [94-108] 94 (08/03 0621) Resp:  [17-18] 17 (08/03 0621) BP: (138-147)/(84-86) 147/85 (08/03 0621) SpO2:  [97 %-98 %] 98 % (08/03 0621) Last BM Date: 04/26/21  Intake/Output from previous day: 08/02 0701 - 08/03 0700 In: 2628.7 [P.O.:2040; I.V.:588.7] Out: 2770 [Urine:2675; Drains:95] Intake/Output this shift: Total I/O In: 120 [P.O.:120] Out: -   PE: General: pleasant, WD, obese male who is sitting up in NAD Heart: regular, rate, and rhythm.   Lungs:   Respiratory effort nonlabored Abd: soft, appropriately ttp, ND, incision c/d/I, drains both SS Psych: A&Ox3 with an appropriate affect.   Lab Results:  Recent Labs    04/26/21 0359  WBC 7.2  HGB 13.2  HCT 41.8  PLT 183   BMET Recent Labs    04/26/21 0359 04/27/21 0412  NA 136 136  K 3.5 3.6  CL 105 102  CO2 25 24  GLUCOSE 103* 93  BUN 9 7  CREATININE 0.86 0.80  CALCIUM 8.2* 8.6*   PT/INR No results for input(s): LABPROT, INR in the last 72 hours. CMP     Component Value Date/Time   NA 136 04/27/2021 0412   K 3.6 04/27/2021 0412   CL 102 04/27/2021 0412   CO2 24 04/27/2021 0412   GLUCOSE 93 04/27/2021 0412   BUN 7 04/27/2021 0412   CREATININE 0.80 04/27/2021 0412   CALCIUM 8.6 (L) 04/27/2021 0412   PROT 6.4 (L) 04/23/2021 0529   ALBUMIN 3.9 04/23/2021 0529   AST 18 04/23/2021 0529   ALT 21 04/23/2021 0529   ALKPHOS 38 04/23/2021 0529   BILITOT 1.6 (H) 04/23/2021 0529   GFRNONAA >60 04/27/2021 0412   GFRAA >60 08/19/2019 0840   Lipase     Component Value Date/Time   LIPASE 28 04/21/2021 0506       Studies/Results: No results found.  Anti-infectives: Anti-infectives (From admission,  onward)    Start     Dose/Rate Route Frequency Ordered Stop   04/22/21 1415  ceFAZolin (ANCEF) IVPB 3g/100 mL premix  Status:  Discontinued        3 g 200 mL/hr over 30 Minutes Intravenous  Once 04/22/21 1404 04/22/21 2016   04/22/21 1400  ceFAZolin (ANCEF) 3 g in dextrose 5 % 50 mL IVPB  Status:  Discontinued        3 g 100 mL/hr over 30 Minutes Intravenous  Once 04/22/21 1356 04/22/21 1404        Assessment/Plan Ventral hernia with SBO POD5 S/P ex-lap, LOA, bilateral posterior rectus sheath myofascial release, incisional hernia repair 04/22/21 Dr. Thermon Leyland - tolerating diet and having bowel function  - binder when OOB - superior drain is in the SQ and is SS, inferior drain located retro-muscular and also SS - remove inferior drain prior to discharge - stable for discharge with follow up next week for removal of remaining drain    FEN: soft diet, IVF per TRH VTE: LMWH ID: ancef periop   HTN HLD GERD OSA Hx of colon cancer Morbid obesity - BMI 46.03  LOS: 6 days    Norm Parcel, [facility] 04/27/2021, 10:17 AM Please see Amion for pager number during day hours 7:00am-4:30pm

## 2021-04-27 NOTE — Progress Notes (Signed)
Inferior JP drain removed per order, output of 11m, pt. Tolerated well. Gauze/dressing in place.  Dressing changed for superior JP drain. Pt. Provided with supplies and educated on how to care for drain.

## 2021-05-03 ENCOUNTER — Other Ambulatory Visit: Payer: Self-pay

## 2021-05-03 ENCOUNTER — Encounter (HOSPITAL_BASED_OUTPATIENT_CLINIC_OR_DEPARTMENT_OTHER): Payer: Self-pay | Admitting: *Deleted

## 2021-05-03 ENCOUNTER — Emergency Department (HOSPITAL_BASED_OUTPATIENT_CLINIC_OR_DEPARTMENT_OTHER)
Admission: EM | Admit: 2021-05-03 | Discharge: 2021-05-03 | Disposition: A | Discharge status: Home / Self Care | Payer: BC Managed Care – PPO | Attending: Emergency Medicine | Admitting: Emergency Medicine

## 2021-05-03 DIAGNOSIS — Z79899 Other long term (current) drug therapy: Secondary | ICD-10-CM | POA: Insufficient documentation

## 2021-05-03 DIAGNOSIS — U071 COVID-19: Secondary | ICD-10-CM | POA: Insufficient documentation

## 2021-05-03 DIAGNOSIS — I10 Essential (primary) hypertension: Secondary | ICD-10-CM | POA: Insufficient documentation

## 2021-05-03 DIAGNOSIS — Z85038 Personal history of other malignant neoplasm of large intestine: Secondary | ICD-10-CM | POA: Diagnosis not present

## 2021-05-03 DIAGNOSIS — R059 Cough, unspecified: Secondary | ICD-10-CM | POA: Diagnosis not present

## 2021-05-03 LAB — RESP PANEL BY RT-PCR (FLU A&B, COVID) ARPGX2
Influenza A by PCR: NEGATIVE
Influenza B by PCR: NEGATIVE
SARS Coronavirus 2 by RT PCR: POSITIVE — AB

## 2021-05-03 MED ORDER — IBUPROFEN 800 MG PO TABS
800.0000 mg | ORAL_TABLET | Freq: Once | ORAL | Status: AC
  Administered 2021-05-03: 800 mg via ORAL
  Filled 2021-05-03: qty 1

## 2021-05-03 MED ORDER — NIRMATRELVIR/RITONAVIR (PAXLOVID)TABLET: 3.0000 | ORAL_TABLET | Freq: Two times a day (BID) | ORAL | 0 refills | Status: AC

## 2021-05-03 NOTE — Discharge Instructions (Addendum)
You tested positive for COVID today which would explain your symptoms.  You were given a prescription for an anti-COVID medication that you can start as soon as possible which will hopefully reduce your symptoms.  Everything with your surgery looks okay.  However if you start having persistent vomiting, severe abdominal pain, passing out or any shortness of breath you should return to the emergency room.

## 2021-05-03 NOTE — ED Triage Notes (Signed)
Pt stated that he can not get warm last night, checked his temp- 99.0.  He also started a cough early this morning-drank a mixture of honey, lemon and tea with a little bit of relief.

## 2021-05-03 NOTE — ED Provider Notes (Signed)
[place] EMERGENCY DEPT Provider Note   CSN: [id number] Arrival date & time: 05/03/21  1647     History Chief Complaint  Patient presents with   Cough   Fever    Cameron Jones is a 55 y.o. male.  Patient is a 55 year old male with a history of GERD, hypertension, recent abdominal surgery due to adhesions and small bowel obstruction who has been out of the hospital for approximately 1 week and presents today for development of cough, headache, fever, general malaise that all started today.  Patient has still been having bowel movements and passing gas.  He has had no vomiting.  He spoke with his doctor's office today and they wanted him to come here to make sure he was not septic.  He denies any shortness of breath or syncope.  He has no new abdominal pain and reports his drains have still been putting out.  Because of all the coughing last night his abdomen is a little more sore.  The history is provided by the patient.  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   Essential hypertension 11/23/2018   OSA (obstructive sleep apnea) 07/26/2017   SBO (small bowel obstruction) ([place]) 12/27/2016   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 05/07/2012   Colon cancer-right T3 N0, Oct 2007 10/06/2011   Ventral hernia-midline 10/06/2011   Obstructive sleep apnea of adult 10/06/2011   Morbid obesity ([place])  10/06/2011    Past Surgical History:  Procedure Laterality Date   BREATH TEK H PYLORI  11/07/2011   Procedure: BREATH TEK H PYLORI;  Surgeon: Shann Medal, MD;  Location: [facility];  Service: General;  Laterality: N/A;   BREATH TEK H PYLORI  01/02/2012   Procedure: Lauris Chroman;  Surgeon: Pedro Earls, MD;  Location: [facility];  Service: General;  Laterality: N/A;   COLON SURGERY  06/2006   colectomy   COLONOSCOPY N/A 05/13/2013   Procedure: COLONOSCOPY;  Surgeon: Winfield Cunas., MD;  Location: [facility];  Service: Endoscopy;  Laterality: N/A;   COLONOSCOPY N/A 08/30/2017   Procedure: COLONOSCOPY;  Surgeon: Laurence Spates, MD;  Location: [facility];  Service: Endoscopy;  Laterality: N/A;   INGUINAL HERNIA REPAIR N/A 08/25/2019   Procedure: HERNIA REPAIR INCARCERATED;  Surgeon: Johnathan Hausen, MD;  Location: [facility];  Service: General;  Laterality: N/A;   LAPAROSCOPIC GASTRIC BAND REMOVAL WITH LAPAROSCOPIC GASTRIC SLEEVE RESECTION N/A 08/25/2019   Procedure: LAPAROSCOPIC GASTRIC BAND REMOVAL WITH LAPAROSCOPIC GASTRIC SLEEVE RESECTION, Upper Endo, ERAS Pathway;  Surgeon: Johnathan Hausen, MD;  Location: [facility];  Service: General;  Laterality: N/A;   LAPAROSCOPIC GASTRIC BANDING  05/07/2012   Procedure: LAPAROSCOPIC GASTRIC BANDING;  Surgeon: Pedro Earls, MD;  Location: [facility];  Service: General;  Laterality: N/A;   LAPAROSCOPIC LYSIS OF ADHESIONS N/A 08/25/2019   Procedure: LAPAROSCOPIC LYSIS OF ADHESIONS;  Surgeon: Johnathan Hausen, MD;  Location: [facility];  Service: General;  Laterality: N/A;   LAPAROTOMY N/A 04/22/2021   Procedure: INCISIONAL HERNIA REPAIR, BILATERAL POSTERIOR RECTUS SHEATH MYOFASCIAL RELEASE, LYSIS OF ADHESIONS, EXPLORATORY LAPAROTOMY;  Surgeon: Felicie Morn, MD;  Location: [facility];  Service: General;  Laterality: N/A;       Family History  Problem Relation Age of Onset   Cancer Mother        pancreatic   Cancer Brother        colon    Alzheimer's disease Father    Hypertension Sister    Cancer Maternal Aunt        breast    Social History   Tobacco Use   Smoking status: Never   Smokeless tobacco: Never  Vaping Use   Vaping Use: Never used  Substance Use Topics   Alcohol use: Not Currently    Alcohol/week: 1.0 standard drink    Types: 1 Standard drinks or equivalent per week   Drug use: No    Home Medications Prior to Admission medications   Medication Sig Start Date End Date Taking? Authorizing Provider  amLODipine (NORVASC) 10 MG tablet Take 10 mg by mouth daily.   Yes [provider]  calcium carbonate (OS-CAL) 1250 (500 Ca) MG chewable tablet Chew 1 tablet by mouth daily.   Yes [provider]  cholecalciferol (VITAMIN D3) 25 MCG (1000 UT) tablet Take 1,000 Units by mouth daily.   Yes [provider]  docusate sodium (COLACE) 100 MG capsule Take 1 capsule (100 mg total) by mouth daily as needed for mild constipation. 04/27/21  Yes Barkley Boards R, PA-C  ferrous sulfate 325 (65 FE) MG tablet Take 325 mg by mouth daily with breakfast.   Yes [provider]  gabapentin (NEURONTIN) 300 MG capsule Take 1 capsule (300 mg total) by mouth 3 (three) times daily for 10 days, THEN 1 capsule (300 mg total) 2 (two) times daily for 10 days, THEN 1 capsule (300 mg total) at bedtime for 10 days. 04/27/21 05/27/21 Yes Norm Parcel, PA-C  Multiple Vitamins-Minerals (MULTIVITAMIN WITH MINERALS) tablet Take 1 tablet by mouth daily.    Yes [provider]  polyethylene glycol (MIRALAX / GLYCOLAX) 17 g packet Take 17 g by mouth daily as needed for mild constipation. 04/27/21  Yes Barkley Boards R, PA-C  vitamin B-12 (CYANOCOBALAMIN) 1000 MCG tablet Take 1,000 mcg by mouth daily.   Yes [provider]  acetaminophen (TYLENOL) 500 MG tablet Take 500 mg by mouth every 6 (six) hours as needed for moderate pain or headache.    [provider]  methocarbamol (ROBAXIN) 500 MG  tablet Take 1 tablet (500 mg total) by mouth every 6 (six) hours as needed for muscle spasms. 04/27/21   Norm Parcel, PA-C  oxyCODONE (OXY IR/ROXICODONE) 5 MG immediate release tablet Take 1 tablet (5 mg total) by mouth every 6 (six) hours as needed for moderate pain or severe pain. 04/27/21   Norm Parcel, PA-C    Allergies    Patient has no known allergies.  Review of Systems   Review of Systems  Constitutional:  Positive for fever.  Respiratory:  Positive for cough.   All other systems reviewed and are negative.  Physical Exam Updated Vital Signs BP (!) 122/52 (BP Location: Left Arm)  Pulse 100   Temp 100 F (37.8 C) (Oral)   Resp 16   Ht '5\' 11"'$  (1.803 m)   Wt (!) 149.7 kg   SpO2 98%   BMI 46.03 kg/m   Physical Exam Vitals and nursing note reviewed.  Constitutional:      General: He is not in acute distress.    Appearance: Normal appearance. He is well-developed.  HENT:     Head: Normocephalic and atraumatic.     Right Ear: Tympanic membrane normal.     Left Ear: Tympanic membrane normal.     Nose: Congestion present.     Mouth/Throat:     Mouth: Mucous membranes are moist.  Eyes:     Conjunctiva/sclera: Conjunctivae normal.     Pupils: Pupils are equal, round, and reactive to light.  Cardiovascular:     Rate and Rhythm: Normal rate and regular rhythm.     Pulses: Normal pulses.     Heart sounds: No murmur heard. Pulmonary:     Effort: Pulmonary effort is normal. No respiratory distress.     Breath sounds: Normal breath sounds. No wheezing or rales.  Abdominal:     General: There is no distension.     Palpations: Abdomen is soft.     Tenderness: There is no abdominal tenderness. There is no guarding or rebound.     Comments: Coralee Pesa is currently in place.  JP drain present with serous and bloody fluid.  Musculoskeletal:        General: No tenderness. Normal range of motion.     Cervical back: Normal range of motion and neck supple.  Skin:    General:  Skin is warm and dry.     Findings: No erythema or rash.  Neurological:     Mental Status: He is alert and oriented to person, place, and time. Mental status is at baseline.  Psychiatric:        Mood and Affect: Mood normal.        Behavior: Behavior normal.    ED Results / Procedures / Treatments   Labs (all labs ordered are listed, but only abnormal results are displayed) Labs Reviewed  RESP PANEL BY RT-PCR (FLU A&B, COVID) ARPGX2 - Abnormal; Notable for the following components:      Result Value   SARS Coronavirus 2 by RT PCR POSITIVE (*)    All other components within normal limits    EKG None  Radiology No results found.  Procedures Procedures   Medications Ordered in ED Medications  ibuprofen (ADVIL) tablet 800 mg (800 mg Oral Given 05/03/21 1755)    ED Course  I have reviewed the triage vital signs and the nursing notes.  Pertinent labs & imaging results that were available during my care of the patient were reviewed by me and considered in my medical decision making (see chart for details).    MDM Rules/Calculators/A&P                           Pt with symptoms consistent with viral URI/covid.  Patient is febrile, mildly tachycardic but otherwise has normal vital signs.  He is mentating normally.  He has no significant abdominal pain despite his recent surgery and drain is putting out a serous fluid without evidence of purulence or drainage.  COVID testing is positive today.  Feel that that would explain patient's current symptoms.  He is satting 100% on room air and having no complaints of  shortness of breath.  Fever did improve after oral therapies.  Do not feel that patient is septic from an acute abdominal process at this time.  He does meet criteria for antivirals and was started on PACs fluid after it was cleared with pharmacy that there are no drug drug interactions.  Patient was given return precautions.  Final Clinical Impression(s) / ED Diagnoses Final  diagnoses:  COVID    Rx / DC Orders ED Discharge Orders          Ordered    nirmatrelvir/ritonavir EUA (PAXLOVID) TABS  2 times daily        05/03/21 1925             Blanchie Dessert, MD 05/03/21 1926

## 2021-06-07 DIAGNOSIS — E7800 Pure hypercholesterolemia, unspecified: Secondary | ICD-10-CM | POA: Diagnosis not present

## 2021-06-07 DIAGNOSIS — Z125 Encounter for screening for malignant neoplasm of prostate: Secondary | ICD-10-CM | POA: Diagnosis not present

## 2021-06-14 DIAGNOSIS — I10 Essential (primary) hypertension: Secondary | ICD-10-CM | POA: Diagnosis not present

## 2021-06-14 DIAGNOSIS — Z1212 Encounter for screening for malignant neoplasm of rectum: Secondary | ICD-10-CM | POA: Diagnosis not present

## 2021-06-14 DIAGNOSIS — R82998 Other abnormal findings in urine: Secondary | ICD-10-CM | POA: Diagnosis not present

## 2023-03-26 ENCOUNTER — Encounter (HOSPITAL_COMMUNITY): Payer: Self-pay | Admitting: *Deleted

## 2024-02-17 ENCOUNTER — Emergency Department (HOSPITAL_BASED_OUTPATIENT_CLINIC_OR_DEPARTMENT_OTHER)

## 2024-02-17 ENCOUNTER — Emergency Department (HOSPITAL_BASED_OUTPATIENT_CLINIC_OR_DEPARTMENT_OTHER)
Admission: EM | Admit: 2024-02-17 | Discharge: 2024-02-17 | Disposition: A | Discharge status: Home / Self Care | Attending: Emergency Medicine | Admitting: Emergency Medicine

## 2024-02-17 ENCOUNTER — Other Ambulatory Visit: Payer: Self-pay

## 2024-02-17 DIAGNOSIS — R361 Hematospermia: Secondary | ICD-10-CM | POA: Diagnosis not present

## 2024-02-17 DIAGNOSIS — I10 Essential (primary) hypertension: Secondary | ICD-10-CM | POA: Diagnosis not present

## 2024-02-17 DIAGNOSIS — Z79899 Other long term (current) drug therapy: Secondary | ICD-10-CM | POA: Insufficient documentation

## 2024-02-17 DIAGNOSIS — M545 Low back pain, unspecified: Secondary | ICD-10-CM | POA: Diagnosis present

## 2024-02-17 LAB — CBC WITH DIFFERENTIAL/PLATELET
Abs Immature Granulocytes: 0.01 10*3/uL (ref 0.00–0.07)
Basophils Absolute: 0 10*3/uL (ref 0.0–0.1)
Basophils Relative: 1 %
Eosinophils Absolute: 0.1 10*3/uL (ref 0.0–0.5)
Eosinophils Relative: 2 %
HCT: 48.5 % (ref 39.0–52.0)
Hemoglobin: 15.2 g/dL (ref 13.0–17.0)
Immature Granulocytes: 0 %
Lymphocytes Relative: 33 %
Lymphs Abs: 2.2 10*3/uL (ref 0.7–4.0)
MCH: 27.8 pg (ref 26.0–34.0)
MCHC: 31.3 g/dL (ref 30.0–36.0)
MCV: 88.8 fL (ref 80.0–100.0)
Monocytes Absolute: 0.8 10*3/uL (ref 0.1–1.0)
Monocytes Relative: 12 %
Neutro Abs: 3.5 10*3/uL (ref 1.7–7.7)
Neutrophils Relative %: 52 %
Platelets: 199 10*3/uL (ref 150–400)
RBC: 5.46 MIL/uL (ref 4.22–5.81)
RDW: 13.4 % (ref 11.5–15.5)
WBC: 6.6 10*3/uL (ref 4.0–10.5)
nRBC: 0 % (ref 0.0–0.2)

## 2024-02-17 LAB — BASIC METABOLIC PANEL WITH GFR
Anion gap: 10 (ref 5–15)
BUN: 15 mg/dL (ref 6–20)
CO2: 27 mmol/L (ref 22–32)
Calcium: 10.1 mg/dL (ref 8.9–10.3)
Chloride: 104 mmol/L (ref 98–111)
Creatinine, Ser: 0.99 mg/dL (ref 0.61–1.24)
GFR, Estimated: >60 mL/min (ref >60)
Glucose, Bld: 82 mg/dL (ref 70–99)
Potassium: 4 mmol/L (ref 3.5–5.1)
Sodium: 142 mmol/L (ref 135–145)

## 2024-02-17 LAB — URINALYSIS, ROUTINE W REFLEX MICROSCOPIC
Bilirubin Urine: NEGATIVE
Glucose, UA: NEGATIVE mg/dL
Hgb urine dipstick: NEGATIVE
Ketones, ur: NEGATIVE mg/dL
Leukocytes,Ua: NEGATIVE
Nitrite: NEGATIVE
Specific Gravity, Urine: 1.026 (ref 1.005–1.030)
pH: 7 (ref 5.0–8.0)

## 2024-02-17 NOTE — ED Notes (Signed)
 Ultrasound tech at bedside

## 2024-02-17 NOTE — Discharge Instructions (Addendum)
 Today you were seen for blood in your semen/urine.  Your labs and imaging were reassuring.  Please follow-up with urology for further evaluation and workup.  Please return to the ED if you have uncontrollable vomiting, fever, or testicular pain.  Thank you for letting us  treat you today. After reviewing your labs and imaging, I feel you are safe to go home. Please follow up with your PCP in the next several days and provide them with your records from this visit. Return to the Emergency Room if pain becomes severe or symptoms worsen.

## 2024-02-17 NOTE — ED Triage Notes (Signed)
 Pt reports blood in urine along with pain in right flank, fatigue and brain fog that began about 2 weeks ago and has worsened in past 2 days.  AAOx4 in triage, NAD.

## 2024-02-17 NOTE — ED Provider Notes (Signed)
 [place] EMERGENCY DEPARTMENT AT [facility] Provider Note   CSN: [id number] Arrival date & time: 02/17/24  1517     History  Chief Complaint  Patient presents with   Hematuria    Cameron Jones is a 58 y.o. male past medical history significant for low back pain and hypertension presents today for blood when ejaculating times approximately 2 weeks.  Patient also notes blood in his urine the first few times after ejaculating.  Patient also reports low back pain worse on the right side though bilaterally.  Patient also endorses increased fatigue and brain fog the past 2 days.  Patient denies fever, chills, nausea, vomiting, dysuria, urgency, frequency, possibility of STD, testicular pain or recent trauma to genitals.  Patient denies blood thinner use.   Hematuria       Home Medications Prior to Admission medications   Medication Sig Start Date End Date Taking? Authorizing Provider  acetaminophen  (TYLENOL ) 500 MG tablet Take 500 mg by mouth every 6 (six) hours as needed for moderate pain or headache.    [provider]  amLODipine  (NORVASC ) 10 MG tablet Take 10 mg by mouth daily.    [provider]  calcium carbonate (OS-CAL) 1250 (500 Ca) MG chewable tablet Chew 1 tablet by mouth daily.    [provider]  cholecalciferol (VITAMIN D3) 25 MCG (1000 UT) tablet Take 1,000 Units by mouth daily.    [provider]  docusate sodium  (COLACE) 100 MG capsule Take 1 capsule (100 mg total) by mouth daily as needed for mild constipation. 04/27/21   Johnson, Kelly R, PA-C  ferrous sulfate 325 (65 FE) MG tablet Take 325 mg by mouth daily with breakfast.    [provider]  gabapentin  (NEURONTIN ) 300 MG capsule Take 1 capsule (300 mg total) by mouth 3 (three) times daily for 10 days, THEN 1 capsule (300 mg total) 2 (two) times daily for 10 days, THEN 1 capsule (300 mg total) at bedtime for 10 days. 04/27/21 05/27/21  Johnson, Kelly R, PA-C   methocarbamol  (ROBAXIN ) 500 MG tablet Take 1 tablet (500 mg total) by mouth every 6 (six) hours as needed for muscle spasms. 04/27/21   Annetta Killian, PA-C  Multiple Vitamins-Minerals (MULTIVITAMIN WITH MINERALS) tablet Take 1 tablet by mouth daily.     [provider]  oxyCODONE  (OXY IR/ROXICODONE ) 5 MG immediate release tablet Take 1 tablet (5 mg total) by mouth every 6 (six) hours as needed for moderate pain or severe pain. 04/27/21   Johnson, Kelly R, PA-C  polyethylene glycol (MIRALAX  / GLYCOLAX ) 17 g packet Take 17 g by mouth daily as needed for mild constipation. 04/27/21   Annetta Killian, PA-C  vitamin B-12 (CYANOCOBALAMIN ) 1000 MCG tablet Take 1,000 mcg by mouth daily.    [provider]      Allergies    Patient has no known allergies.    Review of Systems   Review of Systems  Genitourinary:        Blood in semen    Physical Exam Updated Vital Signs BP (!) 159/95 (BP Location: Right Arm)   Pulse (!) 56   Temp 98.2 F (36.8 C) (Oral)   Resp 18   SpO2 98%  Physical Exam Vitals and nursing note reviewed. Exam conducted with a chaperone present.  Constitutional:      General: He is not in acute distress.    Appearance: Normal appearance. He is well-developed. He is obese. He is not ill-appearing,  toxic-appearing or diaphoretic.  HENT:     Head: Normocephalic and atraumatic.     Right Ear: External ear normal.     Left Ear: External ear normal.     Nose: Nose normal.     Mouth/Throat:     Mouth: Mucous membranes are moist.     Pharynx: Oropharynx is clear.  Eyes:     Extraocular Movements: Extraocular movements intact.     Conjunctiva/sclera: Conjunctivae normal.  Cardiovascular:     Rate and Rhythm: Normal rate and regular rhythm.     Pulses: Normal pulses.     Heart sounds: Normal heart sounds. No murmur heard. Pulmonary:     Effort: Pulmonary effort is normal. No respiratory distress.     Breath sounds: Normal breath sounds.  Abdominal:      General: There is no distension.     Palpations: Abdomen is soft.     Tenderness: There is no abdominal tenderness. There is no right CVA tenderness or left CVA tenderness.     Hernia: There is no hernia in the left inguinal area or right inguinal area.  Genitourinary:    Testes: Normal.     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     Cervical back: Neck supple.  Skin:    General: Skin is warm and dry.     Capillary Refill: Capillary refill takes less than 2 seconds.  Neurological:     General: No focal deficit present.     Mental Status: He is alert and oriented to person, place, and time.  Psychiatric:        Mood and Affect: Mood normal.     ED Results / Procedures / Treatments   Labs (all labs ordered are listed, but only abnormal results are displayed) Labs Reviewed  URINALYSIS, ROUTINE W REFLEX MICROSCOPIC - Abnormal; Notable for the following components:      Result Value   Protein, ur TRACE (*)    All other components within normal limits  BASIC METABOLIC PANEL WITH GFR  CBC WITH DIFFERENTIAL/PLATELET  GC/CHLAMYDIA PROBE AMP ([place]) NOT AT [facility]    EKG None  Radiology US  SCROTUM W/DOPPLER Result Date: 02/17/2024 CLINICAL DATA:  Scrotal lump fluctuating in size since 2007. Bloody ejaculations and lower back pain for 1 week. EXAM: SCROTAL ULTRASOUND DOPPLER ULTRASOUND OF THE TESTICLES TECHNIQUE: Complete ultrasound examination of the testicles, epididymis, and other scrotal structures was performed. Color and spectral Doppler ultrasound were also utilized to evaluate blood flow to the testicles. COMPARISON:  Scrotal ultrasound 06/26/2013 FINDINGS: Right testicle Measurements: 3.3 x 2.3 x 2.7 cm. No mass or microlithiasis visualized. Left testicle Measurements: 3.6 x 2.5 x 2.6 cm. No mass or microlithiasis visualized. Right epididymis:  Benign epididymal head cysts measuring 4 mm. Left epididymis: Cystic  structure in the epididymal head with probable rim calcification measuring 4.9 x 2.7 x 4.0 cm compatible with benign cyst. This is not substantially changed from ultrasound 06/26/2013. Hydrocele: Small to moderate bilateral hydroceles containing some echogenic debris. Varicocele:  None visualized. Pulsed Doppler interrogation of both testes demonstrates normal low resistance arterial and venous waveforms bilaterally. IMPRESSION: 1. Benign epididymal head cysts measuring up to 4.9 cm on the left and not substantially changed from 2014. 2. Small to moderate bilateral hydroceles containing some echogenic debris. Debris was also present on 06/26/2013 and may be chronic in this patient. Hematocele or pyocele could appear similarly. No testicular hyperemia  to suggest orchitis. Electronically Signed   By: Rozell Cornet M.D.   On: 02/17/2024 19:17    Procedures Procedures    Medications Ordered in ED Medications - No data to display  ED Course/ Medical Decision Making/ A&P                                 Medical Decision Making Amount and/or Complexity of Data Reviewed Labs: ordered. Radiology: ordered.   This patient presents to the ED for concern of blood with ejaculation differential diagnosis includes UTI, genital trauma, kidney infection, kidney stone   Lab Tests:  I Ordered, and personally interpreted labs.  The pertinent results include: UA with trace protein, CBC and BMP WNL GC chlamydia pending   Imaging Studies ordered:  I ordered imaging studies including ultrasound scrotum with Doppler I independently visualized and interpreted imaging which showed benign epididymal head cysts on left, unchanged from 2014.  Small to moderate bilateral hydroceles containing some echogenic debris also present in 2014.  No testicular hyperemia to suggest orchitis. I agree with the radiologist interpretation  Problem List / ED Course:  Consider for admission or further workup however patient's  vital signs, physical exam, labs, and imaging of been reassuring.  Patient given return precautions and advised to follow-up with urology.  I feel patient is safe for discharge at this time.         Final Clinical Impression(s) / ED Diagnoses Final diagnoses:  Blood in semen    Rx / DC Orders ED Discharge Orders     None         Carie Charity, PA-C 02/17/24 1941    Teddi Favors, DO 02/18/24 1651

## 2024-03-21 ENCOUNTER — Encounter (HOSPITAL_COMMUNITY): Payer: Self-pay | Admitting: *Deleted
